# Patient Record
Sex: Female | Born: 1975
Health system: Southern US, Community
[De-identification: ages and names within clinical notes are randomized; demographics above are authoritative.]

## PROBLEM LIST (undated history)

## (undated) DIAGNOSIS — Z9289 Personal history of other medical treatment: Secondary | ICD-10-CM

## (undated) DIAGNOSIS — K219 Gastro-esophageal reflux disease without esophagitis: Secondary | ICD-10-CM

## (undated) DIAGNOSIS — R519 Headache, unspecified: Secondary | ICD-10-CM

## (undated) DIAGNOSIS — G971 Other reaction to spinal and lumbar puncture: Secondary | ICD-10-CM

## (undated) DIAGNOSIS — S83271A Complex tear of lateral meniscus, current injury, right knee, initial encounter: Secondary | ICD-10-CM

## (undated) DIAGNOSIS — J40 Bronchitis, not specified as acute or chronic: Secondary | ICD-10-CM

## (undated) DIAGNOSIS — J449 Chronic obstructive pulmonary disease, unspecified: Secondary | ICD-10-CM

## (undated) DIAGNOSIS — D696 Thrombocytopenia, unspecified: Secondary | ICD-10-CM

## (undated) DIAGNOSIS — D649 Anemia, unspecified: Secondary | ICD-10-CM

## (undated) DIAGNOSIS — M1711 Unilateral primary osteoarthritis, right knee: Secondary | ICD-10-CM

## (undated) HISTORY — DX: Chronic obstructive pulmonary disease, unspecified: J44.9

## (undated) HISTORY — DX: Gastro-esophageal reflux disease without esophagitis: K21.9

## (undated) HISTORY — DX: Personal history of other medical treatment: Z92.89

## (undated) HISTORY — PX: LAMINECTOMY LUMBAR SPLINE W/ PLACEMENT SPINAL CORD STIMULATOR: SHX1916

---

## 1995-06-21 HISTORY — PX: TUBAL LIGATION: SHX77

## 2004-11-30 ENCOUNTER — Emergency Department: Payer: Self-pay | Admitting: Emergency Medicine

## 2005-04-04 ENCOUNTER — Emergency Department: Payer: Self-pay | Admitting: Emergency Medicine

## 2005-07-05 ENCOUNTER — Emergency Department: Payer: Self-pay | Admitting: Internal Medicine

## 2005-10-13 ENCOUNTER — Other Ambulatory Visit: Payer: Self-pay

## 2005-10-13 ENCOUNTER — Emergency Department: Payer: Self-pay | Admitting: Emergency Medicine

## 2005-10-14 ENCOUNTER — Other Ambulatory Visit: Payer: Self-pay

## 2005-10-14 ENCOUNTER — Emergency Department: Payer: Self-pay | Admitting: Emergency Medicine

## 2008-06-17 ENCOUNTER — Emergency Department: Payer: Self-pay | Admitting: Internal Medicine

## 2009-06-06 ENCOUNTER — Emergency Department: Payer: Self-pay | Admitting: Emergency Medicine

## 2010-02-01 ENCOUNTER — Emergency Department: Payer: Self-pay | Admitting: Emergency Medicine

## 2010-05-30 ENCOUNTER — Emergency Department: Payer: Self-pay | Admitting: Unknown Physician Specialty

## 2010-11-06 ENCOUNTER — Emergency Department: Payer: Self-pay | Admitting: Emergency Medicine

## 2011-01-20 ENCOUNTER — Emergency Department: Payer: Self-pay | Admitting: Emergency Medicine

## 2011-08-18 ENCOUNTER — Emergency Department: Payer: Self-pay | Admitting: Emergency Medicine

## 2011-09-21 ENCOUNTER — Observation Stay: Payer: Self-pay | Admitting: Internal Medicine

## 2011-09-21 LAB — URINALYSIS, COMPLETE
Ketone: NEGATIVE
Leukocyte Esterase: NEGATIVE
Nitrite: NEGATIVE
Ph: 6 (ref 4.5–8.0)
Protein: NEGATIVE
Specific Gravity: 1.006 (ref 1.003–1.030)

## 2011-09-21 LAB — COMPREHENSIVE METABOLIC PANEL
Albumin: 3.8 g/dL (ref 3.4–5.0)
Bilirubin,Total: 0.2 mg/dL (ref 0.2–1.0)
Calcium, Total: 8.2 mg/dL — ABNORMAL LOW (ref 8.5–10.1)
Creatinine: 0.42 mg/dL — ABNORMAL LOW (ref 0.60–1.30)
Glucose: 78 mg/dL (ref 65–99)
Osmolality: 273 (ref 275–301)
Potassium: 3.9 mmol/L (ref 3.5–5.1)
SGOT(AST): 34 U/L (ref 15–37)
Sodium: 136 mmol/L (ref 136–145)

## 2011-09-21 LAB — IRON AND TIBC
Iron Bind.Cap.(Total): 541 ug/dL — ABNORMAL HIGH (ref 250–450)
Iron Saturation: 3 %
Iron: 16 ug/dL — ABNORMAL LOW (ref 50–170)
Unbound Iron-Bind.Cap.: 525 ug/dL

## 2011-09-21 LAB — CBC
HCT: 24.4 % — ABNORMAL LOW (ref 35.0–47.0)
HGB: 6.9 g/dL — ABNORMAL LOW (ref 12.0–16.0)
MCHC: 28.4 g/dL — ABNORMAL LOW (ref 32.0–36.0)
Platelet: 209 10*3/uL (ref 150–440)

## 2011-09-21 LAB — FERRITIN: Ferritin (ARMC): 2 ng/mL — ABNORMAL LOW (ref 8–388)

## 2011-09-21 LAB — TROPONIN I: Troponin-I: <0.02 ng/mL

## 2011-09-21 LAB — CK TOTAL AND CKMB (NOT AT ARMC): CK, Total: 62 U/L (ref 21–215)

## 2011-10-12 DIAGNOSIS — Z9289 Personal history of other medical treatment: Secondary | ICD-10-CM

## 2011-10-12 HISTORY — DX: Personal history of other medical treatment: Z92.89

## 2011-10-12 LAB — HM PAP SMEAR: HM PAP: NEGATIVE

## 2011-11-07 ENCOUNTER — Ambulatory Visit: Payer: Self-pay | Admitting: Obstetrics and Gynecology

## 2011-11-07 LAB — CBC
HCT: 39.9 % (ref 35.0–47.0)
MCH: 26.3 pg (ref 26.0–34.0)
MCV: 80 fL (ref 80–100)
Platelet: 153 10*3/uL (ref 150–440)
RDW: 32.3 % — ABNORMAL HIGH (ref 11.5–14.5)
WBC: 4.5 10*3/uL (ref 3.6–11.0)

## 2011-11-07 LAB — BASIC METABOLIC PANEL
Co2: 25 mmol/L (ref 21–32)
EGFR (African American): >60
Glucose: 94 mg/dL (ref 65–99)
Potassium: 3.3 mmol/L — ABNORMAL LOW (ref 3.5–5.1)
Sodium: 139 mmol/L (ref 136–145)

## 2011-11-07 LAB — PREGNANCY, URINE: Pregnancy Test, Urine: NEGATIVE m[IU]/mL

## 2011-11-11 ENCOUNTER — Ambulatory Visit: Payer: Self-pay | Admitting: Obstetrics and Gynecology

## 2011-11-11 HISTORY — PX: ENDOMETRIAL ABLATION: SHX621

## 2011-11-11 HISTORY — PX: DILATION AND CURETTAGE OF UTERUS: SHX78

## 2011-11-11 LAB — POTASSIUM: Potassium: 4.1 mmol/L (ref 3.5–5.1)

## 2011-11-17 LAB — PATHOLOGY REPORT

## 2012-01-25 ENCOUNTER — Emergency Department: Payer: Self-pay | Admitting: Emergency Medicine

## 2012-04-21 ENCOUNTER — Emergency Department: Payer: Self-pay | Admitting: Emergency Medicine

## 2012-06-16 ENCOUNTER — Emergency Department: Payer: Self-pay | Admitting: Emergency Medicine

## 2012-06-16 LAB — URINALYSIS, COMPLETE
Bacteria: NONE SEEN
Blood: NEGATIVE
Squamous Epithelial: 2
WBC UR: 1 /HPF (ref 0–5)

## 2012-06-16 LAB — CBC
HCT: 41.8 % (ref 35.0–47.0)
MCHC: 32 g/dL (ref 32.0–36.0)
RDW: 13.3 % (ref 11.5–14.5)
WBC: 3 10*3/uL — ABNORMAL LOW (ref 3.6–11.0)

## 2012-06-16 LAB — COMPREHENSIVE METABOLIC PANEL
Albumin: 3.4 g/dL (ref 3.4–5.0)
Anion Gap: 8 (ref 7–16)
Bilirubin,Total: 0.4 mg/dL (ref 0.2–1.0)
Calcium, Total: 8.5 mg/dL (ref 8.5–10.1)
Creatinine: 0.58 mg/dL — ABNORMAL LOW (ref 0.60–1.30)
Osmolality: 276 (ref 275–301)
Sodium: 139 mmol/L (ref 136–145)
Total Protein: 6.6 g/dL (ref 6.4–8.2)

## 2012-06-16 LAB — CK TOTAL AND CKMB (NOT AT ARMC): CK, Total: 31 U/L (ref 21–215)

## 2012-06-16 LAB — TROPONIN I: Troponin-I: <0.02 ng/mL

## 2012-07-16 ENCOUNTER — Emergency Department: Payer: Self-pay | Admitting: Emergency Medicine

## 2012-07-16 LAB — URINALYSIS, COMPLETE
Bilirubin,UR: NEGATIVE
Glucose,UR: NEGATIVE mg/dL (ref 0–75)
Ketone: NEGATIVE
Leukocyte Esterase: NEGATIVE
Nitrite: NEGATIVE
Ph: 7 (ref 4.5–8.0)
Specific Gravity: 1.023 (ref 1.003–1.030)
Squamous Epithelial: 2
WBC UR: 1 /HPF (ref 0–5)

## 2012-07-16 LAB — BASIC METABOLIC PANEL
BUN: 14 mg/dL (ref 7–18)
Chloride: 110 mmol/L — ABNORMAL HIGH (ref 98–107)
Creatinine: 0.42 mg/dL — ABNORMAL LOW (ref 0.60–1.30)
EGFR (Non-African Amer.): >60
Glucose: 76 mg/dL (ref 65–99)
Osmolality: 279 (ref 275–301)
Sodium: 140 mmol/L (ref 136–145)

## 2012-07-16 LAB — CBC
Platelet: 199 10*3/uL (ref 150–440)
RDW: 14.7 % — ABNORMAL HIGH (ref 11.5–14.5)
WBC: 3.3 10*3/uL — ABNORMAL LOW (ref 3.6–11.0)

## 2012-10-22 ENCOUNTER — Emergency Department: Payer: Self-pay | Admitting: Emergency Medicine

## 2012-11-19 ENCOUNTER — Emergency Department: Payer: Self-pay | Admitting: Emergency Medicine

## 2013-01-30 ENCOUNTER — Emergency Department (HOSPITAL_COMMUNITY)
Admission: EM | Admit: 2013-01-30 | Discharge: 2013-01-30 | Disposition: A | Discharge status: Home / Self Care | Payer: Self-pay | Attending: Emergency Medicine | Admitting: Emergency Medicine

## 2013-01-30 ENCOUNTER — Encounter (HOSPITAL_COMMUNITY): Payer: Self-pay | Admitting: *Deleted

## 2013-01-30 DIAGNOSIS — Z3202 Encounter for pregnancy test, result negative: Secondary | ICD-10-CM | POA: Insufficient documentation

## 2013-01-30 DIAGNOSIS — G43909 Migraine, unspecified, not intractable, without status migrainosus: Secondary | ICD-10-CM | POA: Insufficient documentation

## 2013-01-30 DIAGNOSIS — Z79899 Other long term (current) drug therapy: Secondary | ICD-10-CM | POA: Insufficient documentation

## 2013-01-30 DIAGNOSIS — F17200 Nicotine dependence, unspecified, uncomplicated: Secondary | ICD-10-CM | POA: Insufficient documentation

## 2013-01-30 LAB — CBC WITH DIFFERENTIAL/PLATELET
Basophils Absolute: 0 10*3/uL (ref 0.0–0.1)
Basophils Relative: 0 % (ref 0–1)
Hemoglobin: 13.6 g/dL (ref 12.0–15.0)
MCHC: 34.7 g/dL (ref 30.0–36.0)
Monocytes Relative: 6 % (ref 3–12)
Neutro Abs: 2.4 10*3/uL (ref 1.7–7.7)
Neutrophils Relative %: 55 % (ref 43–77)
RBC: 4.49 MIL/uL (ref 3.87–5.11)

## 2013-01-30 MED ORDER — ONDANSETRON HCL 4 MG/2ML IJ SOLN: 4.0000 mg | Freq: Once | INTRAMUSCULAR | Status: DC

## 2013-01-30 MED ORDER — METOCLOPRAMIDE HCL 5 MG/ML IJ SOLN
10.0000 mg | Freq: Once | INTRAMUSCULAR | Status: AC
  Administered 2013-01-30: 10 mg via INTRAVENOUS
  Filled 2013-01-30: qty 2

## 2013-01-30 MED ORDER — DIPHENHYDRAMINE HCL 50 MG/ML IJ SOLN
25.0000 mg | Freq: Once | INTRAMUSCULAR | Status: AC
  Administered 2013-01-30: 25 mg via INTRAVENOUS
  Filled 2013-01-30: qty 1

## 2013-01-30 MED ORDER — DEXAMETHASONE SODIUM PHOSPHATE 10 MG/ML IJ SOLN
10.0000 mg | Freq: Once | INTRAMUSCULAR | Status: AC
  Administered 2013-01-30: 10 mg via INTRAVENOUS
  Filled 2013-01-30: qty 1

## 2013-01-30 MED ORDER — SODIUM CHLORIDE 0.9 % IV BOLUS (SEPSIS)
1000.0000 mL | Freq: Once | INTRAVENOUS | Status: AC
  Administered 2013-01-30: 1000 mL via INTRAVENOUS

## 2013-01-30 NOTE — ED Provider Notes (Signed)
CSN: [id number]     Arrival date & time 01/30/13  1628 History     First MD Initiated Contact with Patient 01/30/13 1908     Chief Complaint  Patient presents with  . Headache   (Consider location/radiation/quality/duration/timing/severity/associated sxs/prior Treatment) Patient is a 37 y.o. female presenting with headaches. The history is provided by the patient.  Headache Pain location:  Frontal Radiates to:  Does not radiate Severity currently:  9/10 Severity at highest:  10/10 Onset quality:  Gradual Duration:  1 week Timing:  Constant Progression:  Unchanged Chronicity:  New Similar to prior headaches: yes   Relieved by:  Nothing Worsened by:  Nothing tried Ineffective treatments:  Acetaminophen Associated symptoms: nausea and photophobia   Associated symptoms: no abdominal pain, no back pain, no blurred vision, no cough, no diarrhea, no fatigue, no fever, no focal weakness, no neck pain, no neck stiffness, no numbness, no paresthesias, no syncope, no tingling, no visual change, no vomiting and no weakness   Nausea:    Severity:  Mild   Onset quality:  Gradual   Duration:  1 week   Timing:  Intermittent   Progression:  Unchanged   History reviewed. No pertinent past medical history. History reviewed. No pertinent past surgical history. No family history on file. History  Substance Use Topics  . Smoking status: Current Every Day Smoker  . Smokeless tobacco: Not on file  . Alcohol Use: No   OB History   Grav Para Term Preterm Abortions TAB SAB Ect Mult Living                 Review of Systems  Constitutional: Negative for fever, chills, diaphoresis, appetite change and fatigue.  HENT: Negative for neck pain and neck stiffness.   Eyes: Positive for photophobia. Negative for blurred vision and visual disturbance.  Respiratory: Negative for cough, chest tightness and shortness of breath.   Cardiovascular: Negative for chest pain, palpitations and syncope.   Gastrointestinal: Positive for nausea. Negative for vomiting, abdominal pain, diarrhea and abdominal distention.  Genitourinary: Negative for dysuria.  Musculoskeletal: Negative for back pain and gait problem.  Neurological: Positive for light-headedness and headaches. Negative for focal weakness, numbness and paresthesias.  All other systems reviewed and are negative.    Allergies  Review of patient's allergies indicates no known allergies.  Home Medications   Current Outpatient Rx  Name  Route  Sig  Dispense  Refill  . Aspirin-Salicylamide-Caffeine (BC HEADACHE POWDER PO)   Oral   Take 1 each by mouth daily as needed (headache).         . Cholecalciferol (VITAMIN D PO)   Oral   Take 1 tablet by mouth daily.         . Cyanocobalamin (VITAMIN B-12 PO)   Oral   Take 1 tablet by mouth daily.         . IRON PO   Oral   Take 1 tablet by mouth daily.          BP 108/66  Pulse 53  Temp(Src) 98.9 F (37.2 C) (Oral)  Resp 16  SpO2 100% Physical Exam  Nursing note and vitals reviewed. Constitutional: She is oriented to person, place, and time. She appears well-developed and well-nourished. No distress.  HENT:  Head: Normocephalic and atraumatic.  Eyes: EOM are normal. Pupils are equal, round, and reactive to light.  Neck: Normal range of motion. Neck supple.  Cardiovascular: Normal rate, normal heart sounds and intact distal pulses.  Pulmonary/Chest: Effort normal and breath sounds normal. No respiratory distress. She has no wheezes. She has no rales.  Abdominal: Soft. Bowel sounds are normal. She exhibits no distension. There is no tenderness. There is no rebound and no guarding.  Musculoskeletal: Normal range of motion. She exhibits no edema and no tenderness.  Neurological: She is alert and oriented to person, place, and time. She has normal strength. No cranial nerve deficit or sensory deficit. She exhibits normal muscle tone. Coordination and gait normal. GCS  eye subscore is 4. GCS verbal subscore is 5. GCS motor subscore is 6.  Skin: Skin is warm and dry. No rash noted. She is not diaphoretic.    ED Course   Procedures (including critical care time)  Labs Reviewed  CBC WITH DIFFERENTIAL  PREGNANCY, URINE   No results found. 1. Migraine     MDM  37 y.o. F with a PMH of migraines and anemia presenting with HA.  Pt reports HA onset was 1 week ago.  HA gradual in onset, frontal, and typical of her prior migraines. +photophobia. She has been taking BC headache powder with no relief.  Today pt states she became lightheaded and nauseous.  She has not had much po intake this week.  Denies vertigo, vision change, or vomiting.  Denies neck pain or stiffness, and no numbness or focal weakness.  On exam, pt AFVSS, pt well-appearing, in NAD.  No focal neurologic deficits on exam.  Exam otherwise WNL.  Symptoms most consistent with migraine given pt's history and current symptoms similar to prior headaches.  HA gradual in onset and no neck pain or stiffness to suggest SAH.  Will check CBC given h/o anemia and complaint of lightheadedness.  UPT ordered.  Migraine cocktail adn fluids for symptomatic treatment.  CBC with normal hemoglobin. UPT negative. Pt re-evaluated and reports symptomatic resolution. Stable for discharge.  ED return precautions given.  No further questions or concerns, stable at discharge.  Discussed with attending Dr. Bebe Shaggy.    Jodean Lima, MD 01/31/13 [phone]

## 2013-01-30 NOTE — ED Notes (Signed)
Spoke with patient about need for urine sample. Patient agreed to provide, but cannot at this time due to symptoms. ED Techs aware and will allow patient 15 mins prior to offering straight cath.

## 2013-01-30 NOTE — ED Notes (Signed)
Headache fopr one with with dizziness and feeling lightheaded.  Nausea no vomiting

## 2013-02-01 NOTE — ED Provider Notes (Signed)
I have personally seen and examined the patient.  I have discussed the plan of care with the resident.  I have reviewed the documentation on PMH/FH/Soc. History.  I have reviewed the documentation of the resident and agree.  Pt ambulatory, no neuro deficits stable for d/c home I doubt acute neurologic process at this time  Joya Gaskins, MD 02/01/13 [phone]

## 2013-09-13 ENCOUNTER — Emergency Department: Payer: Self-pay | Admitting: Internal Medicine

## 2013-09-16 LAB — BETA STREP CULTURE(ARMC)

## 2013-11-02 ENCOUNTER — Emergency Department: Payer: Self-pay | Admitting: Emergency Medicine

## 2014-01-07 ENCOUNTER — Emergency Department: Payer: Self-pay | Admitting: Emergency Medicine

## 2014-03-18 ENCOUNTER — Emergency Department: Payer: Self-pay | Admitting: Internal Medicine

## 2014-03-18 LAB — URINALYSIS, COMPLETE
BILIRUBIN, UR: NEGATIVE
GLUCOSE, UR: NEGATIVE mg/dL (ref 0–75)
KETONE: NEGATIVE
Nitrite: POSITIVE
PH: 7 (ref 4.5–8.0)
Specific Gravity: 1.011 (ref 1.003–1.030)
Squamous Epithelial: 2
WBC UR: 99 /HPF (ref 0–5)

## 2014-03-18 LAB — PREGNANCY, URINE: PREGNANCY TEST, URINE: NEGATIVE m[IU]/mL

## 2014-03-18 LAB — COMPREHENSIVE METABOLIC PANEL
ALBUMIN: 3.4 g/dL (ref 3.4–5.0)
ALT: 26 U/L
ANION GAP: 7 (ref 7–16)
Alkaline Phosphatase: 62 U/L
BILIRUBIN TOTAL: 0.3 mg/dL (ref 0.2–1.0)
BUN: 14 mg/dL (ref 7–18)
CALCIUM: 8.7 mg/dL (ref 8.5–10.1)
CHLORIDE: 106 mmol/L (ref 98–107)
CO2: 26 mmol/L (ref 21–32)
Creatinine: 0.76 mg/dL (ref 0.60–1.30)
GLUCOSE: 78 mg/dL (ref 65–99)
Osmolality: 277 (ref 275–301)
POTASSIUM: 4 mmol/L (ref 3.5–5.1)
SGOT(AST): 19 U/L (ref 15–37)
SODIUM: 139 mmol/L (ref 136–145)
Total Protein: 6.7 g/dL (ref 6.4–8.2)

## 2014-03-20 LAB — URINE CULTURE

## 2014-04-04 ENCOUNTER — Emergency Department: Payer: Self-pay | Admitting: Emergency Medicine

## 2014-04-04 LAB — CBC
HCT: 40 % (ref 35.0–47.0)
HGB: 13 g/dL (ref 12.0–16.0)
MCH: 29.1 pg (ref 26.0–34.0)
MCHC: 32.4 g/dL (ref 32.0–36.0)
MCV: 90 fL (ref 80–100)
Platelet: 213 10*3/uL (ref 150–440)
RBC: 4.45 10*6/uL (ref 3.80–5.20)
RDW: 13.5 % (ref 11.5–14.5)
WBC: 5.4 10*3/uL (ref 3.6–11.0)

## 2014-04-04 LAB — BASIC METABOLIC PANEL
ANION GAP: 10 (ref 7–16)
BUN: 7 mg/dL (ref 7–18)
CO2: 26 mmol/L (ref 21–32)
Calcium, Total: 8.4 mg/dL — ABNORMAL LOW (ref 8.5–10.1)
Chloride: 107 mmol/L (ref 98–107)
Creatinine: 0.76 mg/dL (ref 0.60–1.30)
EGFR (Non-African Amer.): >60
Glucose: 107 mg/dL — ABNORMAL HIGH (ref 65–99)
Osmolality: 283 (ref 275–301)
Potassium: 3.2 mmol/L — ABNORMAL LOW (ref 3.5–5.1)
SODIUM: 143 mmol/L (ref 136–145)

## 2014-04-04 LAB — TROPONIN I: Troponin-I: <0.02 ng/mL

## 2014-04-19 ENCOUNTER — Emergency Department: Payer: Self-pay | Admitting: Student

## 2014-06-01 ENCOUNTER — Emergency Department (HOSPITAL_COMMUNITY): Payer: Self-pay

## 2014-06-01 ENCOUNTER — Emergency Department (HOSPITAL_COMMUNITY)
Admission: EM | Admit: 2014-06-01 | Discharge: 2014-06-01 | Disposition: A | Discharge status: Home / Self Care | Payer: Self-pay | Attending: Emergency Medicine | Admitting: Emergency Medicine

## 2014-06-01 ENCOUNTER — Encounter (HOSPITAL_COMMUNITY): Payer: Self-pay | Admitting: *Deleted

## 2014-06-01 DIAGNOSIS — Z79899 Other long term (current) drug therapy: Secondary | ICD-10-CM | POA: Insufficient documentation

## 2014-06-01 DIAGNOSIS — R059 Cough, unspecified: Secondary | ICD-10-CM

## 2014-06-01 DIAGNOSIS — D649 Anemia, unspecified: Secondary | ICD-10-CM | POA: Insufficient documentation

## 2014-06-01 DIAGNOSIS — J40 Bronchitis, not specified as acute or chronic: Secondary | ICD-10-CM | POA: Insufficient documentation

## 2014-06-01 DIAGNOSIS — R05 Cough: Secondary | ICD-10-CM

## 2014-06-01 DIAGNOSIS — Z720 Tobacco use: Secondary | ICD-10-CM | POA: Insufficient documentation

## 2014-06-01 HISTORY — DX: Anemia, unspecified: D64.9

## 2014-06-01 MED ORDER — BENZONATATE 100 MG PO CAPS
100.0000 mg | ORAL_CAPSULE | Freq: Three times a day (TID) | ORAL | Status: DC
Start: 2014-06-01 — End: 2014-10-28

## 2014-06-01 MED ORDER — ALBUTEROL SULFATE HFA 108 (90 BASE) MCG/ACT IN AERS
2.0000 | INHALATION_SPRAY | RESPIRATORY_TRACT | Status: DC | PRN
Start: 2014-06-01 — End: 2014-06-01
  Administered 2014-06-01: 2 via RESPIRATORY_TRACT
  Filled 2014-06-01: qty 6.7

## 2014-06-01 MED ORDER — PREDNISONE 20 MG PO TABS: 40.0000 mg | ORAL_TABLET | Freq: Every day | ORAL | Status: DC

## 2014-06-01 NOTE — ED Provider Notes (Signed)
CSN: [id number]     Arrival date & time 06/01/14  5573 History   First MD Initiated Contact with Patient 06/01/14 1140     Chief Complaint  Patient presents with  . Cough     (Consider location/radiation/quality/duration/timing/severity/associated sxs/prior Treatment) HPI Comments: Pt states that for the last week she has had a productive cough and wheezing. Denies fever. States that she has had cough till the point of vomiting. She has tried otc medications without relief. Pt is a smoker. History of bronchitis.  The history is provided by the patient. No language interpreter was used.    Past Medical History  Diagnosis Date  . Anemia    Past Surgical History  Procedure Laterality Date  . Cesarean section    . Tubal ligation    . Dilation and curettage of uterus     No family history on file. History  Substance Use Topics  . Smoking status: Current Every Day Smoker    Types: Cigarettes  . Smokeless tobacco: Never Used  . Alcohol Use: No   OB History    No data available     Review of Systems  All other systems reviewed and are negative.     Allergies  Review of patient's allergies indicates no known allergies.  Home Medications   Prior to Admission medications   Medication Sig Start Date End Date Taking? Authorizing Provider  ferrous fumarate (HEMOCYTE - 106 MG FE) 325 (106 FE) MG TABS tablet Take 1 tablet by mouth daily.   Yes Historical Provider, MD  benzonatate (TESSALON) 100 MG capsule Take 1 capsule (100 mg total) by mouth every 8 (eight) hours. 06/01/14   Glendell Docker, NP  predniSONE (DELTASONE) 20 MG tablet Take 2 tablets (40 mg total) by mouth daily with breakfast. 06/01/14   Glendell Docker, NP   BP 114/76 mmHg  Pulse 66  Temp(Src) 97.8 F (36.6 C) (Oral)  Resp 22  SpO2 100%  LMP 05/20/2014 Physical Exam  Constitutional: She is oriented to person, place, and time. She appears well-developed and well-nourished.  HENT:  Right Ear: External  ear normal.  Left Ear: External ear normal.  Nose: Rhinorrhea present.  Mouth/Throat: Posterior oropharyngeal erythema present.  Cardiovascular: Normal rate and regular rhythm.   Pulmonary/Chest: Effort normal. She has wheezes.  Musculoskeletal: Normal range of motion.  Neurological: She is oriented to person, place, and time.  Skin: Skin is warm and dry.  Nursing note and vitals reviewed.   ED Course  Procedures (including critical care time) Labs Review Labs Reviewed - No data to display  Imaging Review Dg Chest 2 View  06/01/2014   CLINICAL DATA:  38 year old female with 3 month history of productive cough  EXAM: CHEST  2 VIEW  COMPARISON:  Prior chest x-ray 04/04/2014  FINDINGS: The lungs are clear and negative for focal airspace consolidation, pulmonary edema or suspicious pulmonary nodule. No pleural effusion or pneumothorax. Cardiac and mediastinal contours are within normal limits. No acute fracture or lytic or blastic osseous lesions. The visualized upper abdominal bowel gas pattern is unremarkable.  IMPRESSION: Negative chest x-ray.   Electronically Signed   By: Jacqulynn Cadet M.D.   On: 06/01/2014 11:41     EKG Interpretation None      MDM   Final diagnoses:  Bronchitis    Pt sent home with albuterol, prednisone and tessalon. No infection noted on x-ray.pt vitals are good. Discussed return precautions   Glendell Docker, NP 06/01/14 [place] Alvino [place],  MD 06/01/14 1506

## 2014-06-01 NOTE — ED Notes (Signed)
Pt given inhaler and demonstrated proper use without additional questions.

## 2014-06-01 NOTE — ED Notes (Signed)
Patient states she has had a cough productive of yellow sputum since the end of September.  Patient was seen at [facility] and dx with bronchitis.  Patient was placed on abx and completed that course.  She has been taking OTC cold meds with no relief.  Patient states the cough is so strong she sometimes vomits.  Patient also c/o throat and head pain r/t cough.  Patient denies fever and inherent nausea.  Patient denies pain elsewhere.  Patient is coughing on exam.

## 2014-06-01 NOTE — Discharge Instructions (Signed)
Upper Respiratory Infection, Adult An upper respiratory infection (URI) is also sometimes known as the common cold. The upper respiratory tract includes the nose, sinuses, throat, trachea, and bronchi. Bronchi are the airways leading to the lungs. Most people improve within 1 week, but symptoms can last up to 2 weeks. A residual cough may last even longer.  CAUSES Many different viruses can infect the tissues lining the upper respiratory tract. The tissues become irritated and inflamed and often become very moist. Mucus production is also common. A cold is contagious. You can easily spread the virus to others by oral contact. This includes kissing, sharing a glass, coughing, or sneezing. Touching your mouth or nose and then touching a surface, which is then touched by another person, can also spread the virus. SYMPTOMS  Symptoms typically develop 1 to 3 days after you come in contact with a cold virus. Symptoms vary from person to person. They may include:  Runny nose.  Sneezing.  Nasal congestion.  Sinus irritation.  Sore throat.  Loss of voice (laryngitis).  Cough.  Fatigue.  Muscle aches.  Loss of appetite.  Headache.  Low-grade fever. DIAGNOSIS  You might diagnose your own cold based on familiar symptoms, since most people get a cold 2 to 3 times a year. Your caregiver can confirm this based on your exam. Most importantly, your caregiver can check that your symptoms are not due to another disease such as strep throat, sinusitis, pneumonia, asthma, or epiglottitis. Blood tests, throat tests, and X-rays are not necessary to diagnose a common cold, but they may sometimes be helpful in excluding other more serious diseases. Your caregiver will decide if any further tests are required. RISKS AND COMPLICATIONS  You may be at risk for a more severe case of the common cold if you smoke cigarettes, have chronic heart disease (such as heart failure) or lung disease (such as asthma), or if  you have a weakened immune system. The very young and very old are also at risk for more serious infections. Bacterial sinusitis, middle ear infections, and bacterial pneumonia can complicate the common cold. The common cold can worsen asthma and chronic obstructive pulmonary disease (COPD). Sometimes, these complications can require emergency medical care and may be life-threatening. PREVENTION  The best way to protect against getting a cold is to practice good hygiene. Avoid oral or hand contact with people with cold symptoms. Wash your hands often if contact occurs. There is no clear evidence that vitamin C, vitamin E, echinacea, or exercise reduces the chance of developing a cold. However, it is always recommended to get plenty of rest and practice good nutrition. TREATMENT  Treatment is directed at relieving symptoms. There is no cure. Antibiotics are not effective, because the infection is caused by a virus, not by bacteria. Treatment may include:  Increased fluid intake. Sports drinks offer valuable electrolytes, sugars, and fluids.  Breathing heated mist or steam (vaporizer or shower).  Eating chicken soup or other clear broths, and maintaining good nutrition.  Getting plenty of rest.  Using gargles or lozenges for comfort.  Controlling fevers with ibuprofen or acetaminophen as directed by your caregiver.  Increasing usage of your inhaler if you have asthma. Zinc gel and zinc lozenges, taken in the first 24 hours of the common cold, can shorten the duration and lessen the severity of symptoms. Pain medicines may help with fever, muscle aches, and throat pain. A variety of non-prescription medicines are available to treat congestion and runny nose. Your caregiver   can make recommendations and may suggest nasal or lung inhalers for other symptoms.  HOME CARE INSTRUCTIONS   Only take over-the-counter or prescription medicines for pain, discomfort, or fever as directed by your  caregiver.  Use a warm mist humidifier or inhale steam from a shower to increase air moisture. This may keep secretions moist and make it easier to breathe.  Drink enough water and fluids to keep your urine clear or pale yellow.  Rest as needed.  Return to work when your temperature has returned to normal or as your caregiver advises. You may need to stay home longer to avoid infecting others. You can also use a face mask and careful hand washing to prevent spread of the virus. SEEK MEDICAL CARE IF:   After the first few days, you feel you are getting worse rather than better.  You need your caregiver's advice about medicines to control symptoms.  You develop chills, worsening shortness of breath, or brown or red sputum. These may be signs of pneumonia.  You develop yellow or brown nasal discharge or pain in the face, especially when you bend forward. These may be signs of sinusitis.  You develop a fever, swollen neck glands, pain with swallowing, or white areas in the back of your throat. These may be signs of strep throat. SEEK IMMEDIATE MEDICAL CARE IF:   You have a fever.  You develop severe or persistent headache, ear pain, sinus pain, or chest pain.  You develop wheezing, a prolonged cough, cough up blood, or have a change in your usual mucus (if you have chronic lung disease).  You develop sore muscles or a stiff neck. Document Released: 11/30/2000 Document Revised: 08/29/2011 Document Reviewed: 09/11/2013 ExitCare Patient Information 2015 [employer]. This information is not intended to replace advice given to you by your health care provider. Make sure you discuss any questions you have with your health care provider.  

## 2014-07-02 ENCOUNTER — Emergency Department (HOSPITAL_COMMUNITY): Payer: Self-pay

## 2014-07-02 ENCOUNTER — Encounter (HOSPITAL_COMMUNITY): Payer: Self-pay | Admitting: Emergency Medicine

## 2014-07-02 ENCOUNTER — Emergency Department (HOSPITAL_COMMUNITY)
Admission: EM | Admit: 2014-07-02 | Discharge: 2014-07-02 | Disposition: A | Discharge status: Home / Self Care | Payer: Self-pay | Attending: Emergency Medicine | Admitting: Emergency Medicine

## 2014-07-02 DIAGNOSIS — Z87891 Personal history of nicotine dependence: Secondary | ICD-10-CM | POA: Insufficient documentation

## 2014-07-02 DIAGNOSIS — J069 Acute upper respiratory infection, unspecified: Secondary | ICD-10-CM | POA: Insufficient documentation

## 2014-07-02 DIAGNOSIS — R05 Cough: Secondary | ICD-10-CM

## 2014-07-02 DIAGNOSIS — D649 Anemia, unspecified: Secondary | ICD-10-CM | POA: Insufficient documentation

## 2014-07-02 DIAGNOSIS — R059 Cough, unspecified: Secondary | ICD-10-CM

## 2014-07-02 DIAGNOSIS — Z7952 Long term (current) use of systemic steroids: Secondary | ICD-10-CM | POA: Insufficient documentation

## 2014-07-02 DIAGNOSIS — Z79899 Other long term (current) drug therapy: Secondary | ICD-10-CM | POA: Insufficient documentation

## 2014-07-02 MED ORDER — GUAIFENESIN 100 MG/5ML PO SYRP: 100.0000 mg | ORAL_SOLUTION | Freq: Once | ORAL | Status: DC

## 2014-07-02 MED ORDER — GUAIFENESIN 100 MG/5ML PO SOLN
5.0000 mL | Freq: Once | ORAL | Status: AC
  Administered 2014-07-02: 100 mg via ORAL
  Filled 2014-07-02: qty 5

## 2014-07-02 MED ORDER — GUAIFENESIN-CODEINE 100-10 MG/5ML PO SOLN: 5.0000 mL | Freq: Three times a day (TID) | ORAL | Status: DC | PRN

## 2014-07-02 NOTE — Discharge Instructions (Signed)
Upper Respiratory Infection, Adult An upper respiratory infection (URI) is also sometimes known as the common cold. The upper respiratory tract includes the nose, sinuses, throat, trachea, and bronchi. Bronchi are the airways leading to the lungs. Most people improve within 1 week, but symptoms can last up to 2 weeks. A residual cough may last even longer.  CAUSES Many different viruses can infect the tissues lining the upper respiratory tract. The tissues become irritated and inflamed and often become very moist. Mucus production is also common. A cold is contagious. You can easily spread the virus to others by oral contact. This includes kissing, sharing a glass, coughing, or sneezing. Touching your mouth or nose and then touching a surface, which is then touched by another person, can also spread the virus. SYMPTOMS  Symptoms typically develop 1 to 3 days after you come in contact with a cold virus. Symptoms vary from person to person. They may include:  Runny nose.  Sneezing.  Nasal congestion.  Sinus irritation.  Sore throat.  Loss of voice (laryngitis).  Cough.  Fatigue.  Muscle aches.  Loss of appetite.  Headache.  Low-grade fever. DIAGNOSIS  You might diagnose your own cold based on familiar symptoms, since most people get a cold 2 to 3 times a year. Your caregiver can confirm this based on your exam. Most importantly, your caregiver can check that your symptoms are not due to another disease such as strep throat, sinusitis, pneumonia, asthma, or epiglottitis. Blood tests, throat tests, and X-rays are not necessary to diagnose a common cold, but they may sometimes be helpful in excluding other more serious diseases. Your caregiver will decide if any further tests are required. RISKS AND COMPLICATIONS  You may be at risk for a more severe case of the common cold if you smoke cigarettes, have chronic heart disease (such as heart failure) or lung disease (such as asthma), or if  you have a weakened immune system. The very young and very old are also at risk for more serious infections. Bacterial sinusitis, middle ear infections, and bacterial pneumonia can complicate the common cold. The common cold can worsen asthma and chronic obstructive pulmonary disease (COPD). Sometimes, these complications can require emergency medical care and may be life-threatening. PREVENTION  The best way to protect against getting a cold is to practice good hygiene. Avoid oral or hand contact with people with cold symptoms. Wash your hands often if contact occurs. There is no clear evidence that vitamin C, vitamin E, echinacea, or exercise reduces the chance of developing a cold. However, it is always recommended to get plenty of rest and practice good nutrition. TREATMENT  Treatment is directed at relieving symptoms. There is no cure. Antibiotics are not effective, because the infection is caused by a virus, not by bacteria. Treatment may include:  Increased fluid intake. Sports drinks offer valuable electrolytes, sugars, and fluids.  Breathing heated mist or steam (vaporizer or shower).  Eating chicken soup or other clear broths, and maintaining good nutrition.  Getting plenty of rest.  Using gargles or lozenges for comfort.  Controlling fevers with ibuprofen or acetaminophen as directed by your caregiver.  Increasing usage of your inhaler if you have asthma. Zinc gel and zinc lozenges, taken in the first 24 hours of the common cold, can shorten the duration and lessen the severity of symptoms. Pain medicines may help with fever, muscle aches, and throat pain. A variety of non-prescription medicines are available to treat congestion and runny nose. Your caregiver   can make recommendations and may suggest nasal or lung inhalers for other symptoms.  HOME CARE INSTRUCTIONS   Only take over-the-counter or prescription medicines for pain, discomfort, or fever as directed by your  caregiver.  Use a warm mist humidifier or inhale steam from a shower to increase air moisture. This may keep secretions moist and make it easier to breathe.  Drink enough water and fluids to keep your urine clear or pale yellow.  Rest as needed.  Return to work when your temperature has returned to normal or as your caregiver advises. You may need to stay home longer to avoid infecting others. You can also use a face mask and careful hand washing to prevent spread of the virus. SEEK MEDICAL CARE IF:   After the first few days, you feel you are getting worse rather than better.  You need your caregiver's advice about medicines to control symptoms.  You develop chills, worsening shortness of breath, or brown or red sputum. These may be signs of pneumonia.  You develop yellow or brown nasal discharge or pain in the face, especially when you bend forward. These may be signs of sinusitis.  You develop a fever, swollen neck glands, pain with swallowing, or white areas in the back of your throat. These may be signs of strep throat. SEEK IMMEDIATE MEDICAL CARE IF:   You have a fever.  You develop severe or persistent headache, ear pain, sinus pain, or chest pain.  You develop wheezing, a prolonged cough, cough up blood, or have a change in your usual mucus (if you have chronic lung disease).  You develop sore muscles or a stiff neck. Document Released: 11/30/2000 Document Revised: 08/29/2011 Document Reviewed: 09/11/2013 ExitCare Patient Information 2015 [employer]. This information is not intended to replace advice given to you by your health care provider. Make sure you discuss any questions you have with your health care provider.  

## 2014-07-02 NOTE — ED Provider Notes (Signed)
CSN: [id number]     Arrival date & time 07/02/14  7124 History   First MD Initiated Contact with Patient 07/02/14 [phone]     Chief Complaint  Patient presents with  . Cough  . Emesis     (Consider location/radiation/quality/duration/timing/severity/associated sxs/prior Treatment) HPI   39 year old female with history of anemia presents with cough and SOB.  Pt report for the past 4 months she has had intermittent upper respiratory complaints including congestion, and cough which she has been seen in the ER twice previously.  CXR performed showing no evidence of PNA.  She however has been prescribed abx, cough medication and steroid as treatment.  Report intermittent relief with medication.  For the past 5 days her sxs return including congestion, productive cough with yellow phlegm, sob, and posttussive emesis.  No fever, chills.  Report having difficulty tolerating food but currently with out nausea.    Past Medical History  Diagnosis Date  . Anemia    Past Surgical History  Procedure Laterality Date  . Cesarean section    . Tubal ligation    . Dilation and curettage of uterus     No family history on file. History  Substance Use Topics  . Smoking status: Former Smoker    Types: Cigarettes    Quit date: 06/11/2014  . Smokeless tobacco: Never Used  . Alcohol Use: No   OB History    No data available     Review of Systems  All other systems reviewed and are negative.     Allergies  Review of patient's allergies indicates no known allergies.  Home Medications   Prior to Admission medications   Medication Sig Start Date End Date Taking? Authorizing Provider  benzonatate (TESSALON) 100 MG capsule Take 1 capsule (100 mg total) by mouth every 8 (eight) hours. 06/01/14   Glendell Docker, NP  ferrous fumarate (HEMOCYTE - 106 MG FE) 325 (106 FE) MG TABS tablet Take 1 tablet by mouth daily.    Historical Provider, MD  predniSONE (DELTASONE) 20 MG tablet Take 2 tablets (40 mg  total) by mouth daily with breakfast. 06/01/14   Glendell Docker, NP   BP 122/72 mmHg  Pulse 64  Temp(Src) 97.5 F (36.4 C) (Oral)  Resp 18  Ht 5\' 7"  (1.702 m)  Wt 245 lb (111.131 kg)  BMI 38.36 kg/m2  SpO2 100%  LMP 06/20/2014 Physical Exam  Constitutional: She is oriented to person, place, and time. She appears well-developed and well-nourished. No distress.  HENT:  Head: Atraumatic.  Right Ear: External ear normal.  Left Ear: External ear normal.  Mouth/Throat: Oropharynx is clear and moist. No oropharyngeal exudate.  Eyes: Conjunctivae are normal.  Neck: Normal range of motion. Neck supple.  No nuchal rigidity  Cardiovascular: Normal rate and regular rhythm.   Pulmonary/Chest: Effort normal and breath sounds normal.  Abdominal: Soft. There is no tenderness.  Neurological: She is alert and oriented to person, place, and time.  Skin: No rash noted.  Psychiatric: She has a normal mood and affect.  Nursing note and vitals reviewed.   ED Course  Procedures (including critical care time)  7:30 AM Recurrent URI sxs, not improved with steroid/cough medication and abx.    8:29 AM Chest x-ray without acute infiltrate. Patient ambulate without hypoxia. She is afebrile, vital signs stable. Suspect viral syndrome. Will prescribe cough medication. Symptomatically treatment. Patient agrees to follow-up with PCP for further care. Return precautions discussed.  Labs Review Labs Reviewed - No data to  display  Imaging Review Dg Chest 2 View  07/02/2014   CLINICAL DATA:  Productive cough for 4 days. Vomiting today. Initial encounter.  EXAM: CHEST  2 VIEW  COMPARISON:  PA and lateral chest 06/01/2014 and 09/21/2011.  FINDINGS: Heart size and mediastinal contours are within normal limits. Both lungs are clear. Visualized skeletal structures are unremarkable.  IMPRESSION: Negative exam.   Electronically Signed   By: Inge Rise M.D.   On: 07/02/2014 07:47     EKG  Interpretation None      MDM   Final diagnoses:  Cough  URI (upper respiratory infection)    BP 122/72 mmHg  Pulse 64  Temp(Src) 97.5 F (36.4 C) (Oral)  Resp 18  Ht 5\' 7"  (1.702 m)  Wt 245 lb (111.131 kg)  BMI 38.36 kg/m2  SpO2 100%  LMP 06/20/2014  I have reviewed nursing notes and vital signs. I personally reviewed the imaging tests through PACS system  I reviewed available ER/hospitalization records thought the EMR     Domenic Moras, PA-C 07/02/14 [place], DO 07/05/14 [id number]

## 2014-07-02 NOTE — ED Notes (Signed)
Pt c/o cough x 4 days, emesis today. Pt tx for bronchitis recently with cough meds, steroids and MDI. Pt states now worse.

## 2014-09-04 ENCOUNTER — Emergency Department (HOSPITAL_COMMUNITY): Admission: EM | Admit: 2014-09-04 | Discharge: 2014-09-04 | Discharge status: Against Medical Advice | Payer: Self-pay

## 2014-09-04 NOTE — ED Notes (Signed)
No answer from triage

## 2014-09-04 NOTE — ED Notes (Signed)
Pt not answering from triage

## 2014-09-09 ENCOUNTER — Emergency Department: Payer: Self-pay | Admitting: Internal Medicine

## 2014-10-12 NOTE — Discharge Summary (Signed)
PATIENT NAME:  Kara West, Kara West MR#:  [id number] DATE OF BIRTH:  1976/05/12  DATE OF ADMISSION:  09/21/2011 DATE OF DISCHARGE:  09/21/2011  ADMITTING DIAGNOSES:  1. Substernal burning sensation. 2. Epigastric pain. 3. Dyspnea on exertion.   DISCHARGE DIAGNOSES:  1. Substernal burning likely due to reflux symptoms. The patient will be placed on PPIs.  2. Symptomatic anemia likely due to menorrhagia. The patient is status post transfusion 2 units of packed RBCs. Will need outpatient GYN follow-up. The patient currently is not menstruating.  3. Severe iron deficiency anemia due to menorrhagia. The patient will be discharged on iron supplements.   CONSULTANTS: None.   PERTINENT LABS AND EVALUATIONS: CBC on presentation 3.7, hemoglobin 6.9, platelet count 209, MCV 61. Troponin less than 0.02. CK-MB less than 0.5. EKG showed normal sinus rhythm without any ST-T wave changes. Chest x-ray was no acute cardiopulmonary processes. Iron level 16, iron saturation 3, ferritin 2.  HOSPITAL COURSE: Please see history and physical for details. In brief, the patient is a 39 year old African American female who presented with complaint of having substernal burning especially with laying down, also some epigastric discomfort. The patient was seen in the ED and noted to have a hemoglobin of 6.9. On further questioning she reported that over the past year she has had heavy menstruation and has been eating a lot of ice due to these symptoms. She was also having some dyspnea on exertion and I was asked to admit the patient for observation and transfusion. The patient had further work-up including iron studies which showed she was severely iron depleted as a result of her menorrhagia. The patient was transfused 2 units of packed RBCs and was placed on PPIs for her symptoms. If she continues to have GI symptoms, she will be referred for outpatient GI at [facility].    DISCHARGE MEDICATIONS:  1. Iron sulfate 325 p.o. b.i.d.   2. Esomeprazole 40 daily.  3. Maalox 15 mL q.8 p.r.n. heartburn.   HOME OXYGEN: None.   DIET: Regular.   ACTIVITY: As tolerated.   TIMEFRAME FOR FOLLOW UP:  1. 1 to 2 weeks with GYN of choice for menorrhalgia. 2. Follow with [facility] as a new patient for abdominal pain and gastroesophageal reflux disease.   TIME SPENT: 25 minutes.   ____________________________ Lafonda Mosses Posey Pronto, MD shp:drc D: 09/22/2011 11:46:53 ET T: 09/22/2011 14:29:09 ET JOB#: [id number]  cc: Ember Henrikson H. Posey Pronto, MD, <Dictator> Alric Seton MD ELECTRONICALLY SIGNED 09/23/2011 12:18

## 2014-10-12 NOTE — H&P (Signed)
PATIENT NAME:  MERCY, Kara West MR#:  [id number] DATE OF BIRTH:  [birth year]  DATE OF ADMISSION:  09/21/2011  PRIMARY CARE PHYSICIAN: None.   CHIEF COMPLAINT: Substernal burning sensation, as well as epigastric pain, as well as some dyspnea on exertion with noted anemia.   HISTORY OF PRESENT ILLNESS: The patient is a 39 year old female who presents to the ED with complaint of right-sided chest pain, which she describes as burning type of pain. She states that it is constant in nature and has been going on for one week now. She reports that certain foods make the symptoms worse. She has also been having some coughing associated with this, as well as shortness of breath. Lying down makes her symptoms worse. The patient also complains of epigastric sharp pain, which she reports come and go.  In the emergency department, the patient was noted to have a hemoglobin of 6.9 with a MCV of 61. On further questioning, the patient reports that she has been having, over the past 1-1/2 years, heavy menstruations that last greater than one week. She also states that she eats a lot of ice. She otherwise denies any fevers or chills. She does complain of some weakness and dyspnea on exertion.   PAST MEDICAL HISTORY: None.   PAST SURGICAL HISTORY:  1. Status post cesarean section. 2. Status post bilateral tubal ligation.   ALLERGIES: No known drug allergies.   MEDICATIONS: None.  SOCIAL HISTORY: She smokes about 1 pack per week. Drinks occasionally. No drug use.   FAMILY HISTORY: There is no coronary artery disease in the family.   REVIEW OF SYSTEMS: CONSTITUTIONAL: Denies any fevers. Complains of fatigue, weakness, and abdominal pain, as above. No weight loss. No weight gain. EYES: No blurred or double vision. No pain. No redness. No inflammation. No glaucoma. ENT: No tinnitus. No ear pain. No hearing loss. No allergies, seasonal or year-round. No epistaxis. No nasal discharge. No snoring. No postnasal drip. No  sinus pain. RESPIRATORY: No cough. No wheezing. No hemoptysis. No chronic obstructive pulmonary disease. No tuberculosis. CARDIOVASCULAR: Chest pain as above. Complains of some mild dyspnea on exertion. No palpitations. No syncope. GASTROINTESTINAL: No nausea, vomiting, or diarrhea. Complains of epigastric pain. No hematemesis. No melena. No change in bowel habits. No hemorrhoids. GU: Denies any dysuria, hematuria, renal calculus, or frequency. ENDOCRINE: Denies any polydipsia, nocturia, or thyroid problems. HEME/LYMPH: No history of anemia. No easy bruisability or bleeding. SKIN: No acne. No rash. No changes in mole, hair, or skin. MUSCULOSKELETAL: No pain in the neck, back, or shoulder. No gout. NEUROLOGIC: No numbness. No cerebrovascular accident. No transient ischemic attack. No seizures. PSYCHIATRIC: No anxiety. No insomnia. No ADD. No OCD.   PHYSICAL EXAMINATION:   VITAL SIGNS: Temperature 95.7, pulse 74, respirations 18, blood pressure 130/61, and O2 100% on room air.   GENERAL: The patient is a well-developed, African American female in no acute distress.   HEENT: Head atraumatic, normocephalic. Pupils are equal, round, and reactive to light and accommodation. Extraocular movements intact. Oropharynx is clear without any exudates. There is no JVD. No carotid bruits. Nasal exam shows no ulceration or drainage. External ear exam shows no erythema or drainage.   CARDIOVASCULAR: Regular rate and rhythm. No murmurs, rubs, clicks, or gallops. PMI is not displaced.   LUNGS: Clear to auscultation bilaterally without any rales, rhonchi, or wheezing.   ABDOMEN: Soft, nontender, and nondistended. Positive bowel sounds x4.   EXTREMITIES: No clubbing, cyanosis, or edema.   NEUROLOGIC: Awake, alert,  and oriented x3. No focal deficits.   SKIN: No rash.   LYMPHATICS: No lymph nodes palpable.   VASCULAR: Good DP and PT pulses.   LYMPHATICS: No lymph nodes palpable.   PSYCHIATRIC: Not anxious or  depressed.   VASCULAR: Good DP and PT pulses.   EVALUATIONS: Chest x-ray shows no acute cardiopulmonary processes.   EKG shows normal sinus rhythm without any ST-T wave changes.   CPK 62. CK-MB less than 0.5. BMP: Glucose 78, BUN 18, creatinine 0.42, sodium 136, potassium 3.9, chloride 106, CO2 22, and calcium 8.2. LFTs are normal. WBC 3.7, hemoglobin 6.9, and MCV 61. Troponin less than 0.02.   ASSESSMENT AND PLAN: The patient is a 39 year old female who presents with symptomatic anemia along with complaint of some chest burning as well as epigastric discomfort.  1. Microcytic anemia, likely in the setting of heavy menstruation. At this time, we will place the patient on observation and transfuse her with 2 units of packed RBCs. We will check her iron level prior to transfusion. She will need iron therapy. Also recommend outpatient GYN follow-up.  2. Substernal burning and cough, likely related to GERD related symptoms. We will try PPI. If she continues to experience abdominal symptoms, consider evaluation for gallbladder disease.   DISPOSITION: The patient is interested in going home later today after transfusion. Once she is able to tolerate the transfusion, we will discharge her home.          Please note, the patient was explained the risks and benefits of transfusion.   TIME SPENT: 30 minutes.  ____________________________ Lafonda Mosses Posey Pronto, MD shp:slb D: 09/21/2011 10:50:50 ET (Entered as incorrect work type - 09) T: 09/21/2011 14:22:31 ET JOB#: [id number]  cc: Melinda Pottinger H. Posey Pronto, MD, <Dictator> Alric Seton MD ELECTRONICALLY SIGNED 09/23/2011 12:17

## 2014-10-27 ENCOUNTER — Emergency Department (HOSPITAL_COMMUNITY): Payer: Self-pay

## 2014-10-27 ENCOUNTER — Emergency Department (HOSPITAL_COMMUNITY)
Admission: EM | Admit: 2014-10-27 | Discharge: 2014-10-28 | Disposition: A | Discharge status: Home / Self Care | Payer: Self-pay | Attending: Emergency Medicine | Admitting: Emergency Medicine

## 2014-10-27 ENCOUNTER — Encounter (HOSPITAL_COMMUNITY): Payer: Self-pay | Admitting: Emergency Medicine

## 2014-10-27 DIAGNOSIS — R05 Cough: Secondary | ICD-10-CM | POA: Insufficient documentation

## 2014-10-27 DIAGNOSIS — Z3202 Encounter for pregnancy test, result negative: Secondary | ICD-10-CM | POA: Insufficient documentation

## 2014-10-27 DIAGNOSIS — E876 Hypokalemia: Secondary | ICD-10-CM | POA: Insufficient documentation

## 2014-10-27 DIAGNOSIS — D649 Anemia, unspecified: Secondary | ICD-10-CM | POA: Insufficient documentation

## 2014-10-27 DIAGNOSIS — R059 Cough, unspecified: Secondary | ICD-10-CM

## 2014-10-27 DIAGNOSIS — Z79899 Other long term (current) drug therapy: Secondary | ICD-10-CM | POA: Insufficient documentation

## 2014-10-27 DIAGNOSIS — R1110 Vomiting, unspecified: Secondary | ICD-10-CM | POA: Insufficient documentation

## 2014-10-27 DIAGNOSIS — R0789 Other chest pain: Secondary | ICD-10-CM | POA: Insufficient documentation

## 2014-10-27 DIAGNOSIS — Z87891 Personal history of nicotine dependence: Secondary | ICD-10-CM | POA: Insufficient documentation

## 2014-10-27 DIAGNOSIS — R062 Wheezing: Secondary | ICD-10-CM | POA: Insufficient documentation

## 2014-10-27 DIAGNOSIS — R0602 Shortness of breath: Secondary | ICD-10-CM | POA: Insufficient documentation

## 2014-10-27 LAB — URINALYSIS, ROUTINE W REFLEX MICROSCOPIC
Bilirubin Urine: NEGATIVE
GLUCOSE, UA: NEGATIVE mg/dL
Hgb urine dipstick: NEGATIVE
Ketones, ur: NEGATIVE mg/dL
LEUKOCYTES UA: NEGATIVE
Nitrite: NEGATIVE
PROTEIN: NEGATIVE mg/dL
Specific Gravity, Urine: 1.025 (ref 1.005–1.030)
Urobilinogen, UA: 0.2 mg/dL (ref 0.0–1.0)
pH: 6 (ref 5.0–8.0)

## 2014-10-27 LAB — CBC WITH DIFFERENTIAL/PLATELET
BASOS ABS: 0 10*3/uL (ref 0.0–0.1)
Basophils Relative: 0 % (ref 0–1)
Eosinophils Absolute: 0.1 10*3/uL (ref 0.0–0.7)
Eosinophils Relative: 0 % (ref 0–5)
HEMATOCRIT: 44 % (ref 36.0–46.0)
Hemoglobin: 14.7 g/dL (ref 12.0–15.0)
LYMPHS ABS: 2.4 10*3/uL (ref 0.7–4.0)
LYMPHS PCT: 14 % (ref 12–46)
MCH: 29.5 pg (ref 26.0–34.0)
MCHC: 33.4 g/dL (ref 30.0–36.0)
MCV: 88.4 fL (ref 78.0–100.0)
MONO ABS: 0.7 10*3/uL (ref 0.1–1.0)
Monocytes Relative: 4 % (ref 3–12)
Neutro Abs: 13.5 10*3/uL — ABNORMAL HIGH (ref 1.7–7.7)
Neutrophils Relative %: 82 % — ABNORMAL HIGH (ref 43–77)
Platelets: 212 10*3/uL (ref 150–400)
RBC: 4.98 MIL/uL (ref 3.87–5.11)
RDW: 13.4 % (ref 11.5–15.5)
WBC: 16.7 10*3/uL — AB (ref 4.0–10.5)

## 2014-10-27 LAB — COMPREHENSIVE METABOLIC PANEL
ALT: 15 U/L (ref 14–54)
AST: 18 U/L (ref 15–41)
Albumin: 3.7 g/dL (ref 3.5–5.0)
Alkaline Phosphatase: 68 U/L (ref 38–126)
Anion gap: 6 (ref 5–15)
BUN: 13 mg/dL (ref 6–20)
CALCIUM: 8.9 mg/dL (ref 8.9–10.3)
CO2: 19 mmol/L — ABNORMAL LOW (ref 22–32)
CREATININE: 0.59 mg/dL (ref 0.44–1.00)
Chloride: 112 mmol/L — ABNORMAL HIGH (ref 101–111)
GFR calc Af Amer: >60 mL/min (ref >60)
Glucose, Bld: 114 mg/dL — ABNORMAL HIGH (ref 70–99)
Potassium: 3.2 mmol/L — ABNORMAL LOW (ref 3.5–5.1)
Sodium: 137 mmol/L (ref 135–145)
Total Bilirubin: 0.9 mg/dL (ref 0.3–1.2)
Total Protein: 7 g/dL (ref 6.5–8.1)

## 2014-10-27 LAB — POC URINE PREG, ED: PREG TEST UR: NEGATIVE

## 2014-10-27 LAB — LIPASE, BLOOD: LIPASE: 15 U/L — AB (ref 22–51)

## 2014-10-27 MED ORDER — ALBUTEROL SULFATE (2.5 MG/3ML) 0.083% IN NEBU
5.0000 mg | INHALATION_SOLUTION | Freq: Once | RESPIRATORY_TRACT | Status: AC
  Administered 2014-10-28: 5 mg via RESPIRATORY_TRACT
  Filled 2014-10-27: qty 6

## 2014-10-27 MED ORDER — IOHEXOL 350 MG/ML SOLN
100.0000 mL | Freq: Once | INTRAVENOUS | Status: AC | PRN
  Administered 2014-10-27: 100 mL via INTRAVENOUS

## 2014-10-27 MED ORDER — ONDANSETRON HCL 4 MG/2ML IJ SOLN
4.0000 mg | Freq: Once | INTRAMUSCULAR | Status: AC
  Administered 2014-10-27: 4 mg via INTRAVENOUS
  Filled 2014-10-27: qty 2

## 2014-10-27 MED ORDER — METHYLPREDNISOLONE SODIUM SUCC 125 MG IJ SOLR
125.0000 mg | Freq: Once | INTRAMUSCULAR | Status: AC
  Administered 2014-10-27: 125 mg via INTRAVENOUS
  Filled 2014-10-27: qty 2

## 2014-10-27 MED ORDER — SODIUM CHLORIDE 0.9 % IV BOLUS (SEPSIS)
1000.0000 mL | Freq: Once | INTRAVENOUS | Status: AC
  Administered 2014-10-27: 1000 mL via INTRAVENOUS

## 2014-10-27 MED ORDER — ALBUTEROL SULFATE (2.5 MG/3ML) 0.083% IN NEBU
5.0000 mg | INHALATION_SOLUTION | Freq: Once | RESPIRATORY_TRACT | Status: AC
  Administered 2014-10-27: 5 mg via RESPIRATORY_TRACT
  Filled 2014-10-27: qty 6

## 2014-10-27 MED ORDER — IPRATROPIUM BROMIDE 0.02 % IN SOLN
0.5000 mg | Freq: Once | RESPIRATORY_TRACT | Status: AC
  Administered 2014-10-27: 0.5 mg via RESPIRATORY_TRACT
  Filled 2014-10-27: qty 2.5

## 2014-10-27 MED ORDER — IPRATROPIUM BROMIDE 0.02 % IN SOLN
0.5000 mg | Freq: Once | RESPIRATORY_TRACT | Status: AC
  Administered 2014-10-28: 0.5 mg via RESPIRATORY_TRACT
  Filled 2014-10-27: qty 2.5

## 2014-10-27 MED ORDER — HYDROCODONE-HOMATROPINE 5-1.5 MG/5ML PO SYRP
5.0000 mL | ORAL_SOLUTION | Freq: Once | ORAL | Status: AC
  Administered 2014-10-27: 5 mL via ORAL
  Filled 2014-10-27: qty 5

## 2014-10-27 NOTE — ED Notes (Signed)
IV RN bedside.

## 2014-10-27 NOTE — ED Notes (Signed)
CT notified patient is ready for CT. 

## 2014-10-27 NOTE — ED Notes (Signed)
PER CT staff, pt's 20g IV infiltrated during procedure. IV removed. Warm compress applied.  Orvil Feil, Pa made aware. Reported to notify IV team for another IV start for procedure.

## 2014-10-27 NOTE — ED Notes (Signed)
RT notified of pending orders.  

## 2014-10-27 NOTE — ED Notes (Signed)
Patient returned from CT

## 2014-10-27 NOTE — ED Notes (Signed)
PA at bedside.

## 2014-10-27 NOTE — ED Notes (Signed)
Patient transported to CT 

## 2014-10-27 NOTE — ED Notes (Signed)
Pt c/o cough and emesis x 2 weeks, last episode of emesis today. Pt states she was diagnosed with bronchitis 9 months ago and still has bronchitis.

## 2014-10-27 NOTE — ED Provider Notes (Signed)
CSN: [id number]     Arrival date & time 10/27/14  1454 History   First MD Initiated Contact with Patient 10/27/14 1924     Chief Complaint  Patient presents with  . Cough     (Consider location/radiation/quality/duration/timing/severity/associated sxs/prior Treatment) The history is provided by the patient and medical records. No language interpreter was used.     Kara West is a 39 y.o. female  with a hx of anemia presents to the Emergency Department complaining of gradual, persistent, progressively worsening cough, SOB onset 3 days ago.  Pt reports she has stopped smoking but her SOB has worsened. She reports she stopped smoking in Aug 2015 after being diagnosed with bronchitis.  Pt reports she was given prednisone, azithromycin, Tessalon, albuterol to treat it, which made a significant difference.  She reports she continued using the inhaler with adequate relief but she ran out 2 days ago. 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  Laying flat on her back makes her breathing significantly worse.  She denies fever, chills, headache, neck pain. Pt also c/o vomiting over the last several days that is post-tussive in nature.  She reports that it is stomach contents and is NBNB.  Pt reports that movement also makes it worse.  Pt denies fever, chills, headache, neck pain, chest pain, abdominal pain, diarrhea, weakness, dizziness, syncope, dysuria, hematuria.  Patient denies unilateral leg swelling, recent travel, recent surgery, recent broken bones, leg pain, estrogen usage, history of DVT.   Past Medical History  Diagnosis Date  . Anemia    Past Surgical History  Procedure Laterality Date  . Cesarean section    . Tubal ligation    . Dilation and curettage of uterus     History reviewed. No pertinent family history. History  Substance Use Topics  . Smoking status: Former Smoker    Types: Cigarettes    Quit date: 06/11/2014  . Smokeless tobacco:  Never Used  . Alcohol Use: No   OB History    No data available     Review of Systems  Constitutional: Negative for fever, diaphoresis, appetite change, fatigue and unexpected weight change.  HENT: Negative for mouth sores.   Eyes: Negative for visual disturbance.  Respiratory: Positive for cough, chest tightness, shortness of breath and wheezing.   Cardiovascular: Negative for chest pain.  Gastrointestinal: Positive for vomiting ( Posttussive emesis). Negative for nausea, abdominal pain, diarrhea and constipation.  Endocrine: Negative for polydipsia, polyphagia and polyuria.  Genitourinary: Negative for dysuria, urgency, frequency and hematuria.  Musculoskeletal: Negative for back pain and neck stiffness.  Skin: Negative for rash.  Allergic/Immunologic: Negative for immunocompromised state.  Neurological: Negative for syncope, light-headedness and headaches.  Hematological: Does not bruise/bleed easily.  Psychiatric/Behavioral: Negative for sleep disturbance. The patient is not nervous/anxious.       Allergies  Review of patient's allergies indicates no known allergies.  Home Medications   Prior to Admission medications   Medication Sig Start Date End Date Taking? Authorizing Provider  ferrous fumarate (HEMOCYTE - 106 MG FE) 325 (106 FE) MG TABS tablet Take 1 tablet by mouth daily.   Yes Historical Provider, MD  albuterol (PROVENTIL HFA;VENTOLIN HFA) 108 (90 BASE) MCG/ACT inhaler Inhale 2 puffs into the lungs every 4 (four) hours as needed for wheezing or shortness of breath. 10/28/14   Avon Mergenthaler, PA-C  albuterol (PROVENTIL) (2.5 MG/3ML) 0.083% nebulizer solution Take 3 mLs (2.5 mg total) by nebulization every 4 (four)  hours as needed for wheezing or shortness of breath. 10/28/14   Simmie Garin, PA-C  azithromycin (ZITHROMAX) 250 MG tablet Take 1 tablet (250 mg total) by mouth daily. Take first 2 tablets together, then 1 every day until finished. 10/28/14   Luberta Grabinski, PA-C  benzonatate (TESSALON) 100 MG capsule Take 2 capsules (200 mg total) by mouth 2 (two) times daily as needed for cough. 10/28/14   Hollace Michelli, PA-C  guaiFENesin-codeine 100-10 MG/5ML syrup Take 5 mLs by mouth 3 (three) times daily as needed for cough. Patient not taking: Reported on 10/27/2014 07/02/14   Domenic Moras, PA-C  predniSONE (DELTASONE) 20 MG tablet Take 2 tablets (40 mg total) by mouth daily. 10/28/14   Veverly Larimer, PA-C   BP 105/62 mmHg  Pulse 92  Temp(Src)   Resp 18  SpO2 98%  LMP 10/11/2014 (Approximate) Physical Exam  Constitutional: She is oriented to person, place, and time. She appears well-developed and well-nourished. No distress.  Awake, alert, nontoxic appearance  HENT:  Head: Normocephalic and atraumatic.  Right Ear: Tympanic membrane, external ear and ear canal normal.  Left Ear: Tympanic membrane, external ear and ear canal normal.  Nose: Mucosal edema and rhinorrhea present. No epistaxis. Right sinus exhibits no maxillary sinus tenderness and no frontal sinus tenderness. Left sinus exhibits no maxillary sinus tenderness and no frontal sinus tenderness.  Mouth/Throat: Uvula is midline, oropharynx is clear and moist and mucous membranes are normal. Mucous membranes are not pale and not cyanotic. No oropharyngeal exudate, posterior oropharyngeal edema, posterior oropharyngeal erythema or tonsillar abscesses.  Eyes: Conjunctivae are normal. Pupils are equal, round, and reactive to light. No scleral icterus.  Neck: Normal range of motion and full passive range of motion without pain. Neck supple.  Cardiovascular: Normal rate, regular rhythm, normal heart sounds and intact distal pulses.   No murmur heard. Pulmonary/Chest: Effort normal. No stridor. No respiratory distress. She has wheezes.  Inspiratory and expiratory wheezes throughout Course cough  Abdominal: Soft. Bowel sounds are normal. She exhibits no mass. There is no tenderness.  There is no rebound and no guarding.  Musculoskeletal: Normal range of motion. She exhibits no edema.  Lymphadenopathy:    She has no cervical adenopathy.  Neurological: She is alert and oriented to person, place, and time.  Speech is clear and goal oriented Moves extremities without ataxia  Skin: Skin is warm and dry. No rash noted. She is not diaphoretic.  Psychiatric: She has a normal mood and affect.  Nursing note and vitals reviewed.   ED Course  Procedures (including critical care time) Labs Review Labs Reviewed  CBC WITH DIFFERENTIAL/PLATELET - Abnormal; Notable for the following:    WBC 16.7 (*)    Neutrophils Relative % 82 (*)    Neutro Abs 13.5 (*)    All other components within normal limits  COMPREHENSIVE METABOLIC PANEL - Abnormal; Notable for the following:    Potassium 3.2 (*)    Chloride 112 (*)    CO2 19 (*)    Glucose, Bld 114 (*)    All other components within normal limits  LIPASE, BLOOD - Abnormal; Notable for the following:    Lipase 15 (*)    All other components within normal limits  URINALYSIS, ROUTINE W REFLEX MICROSCOPIC - Abnormal; Notable for the following:    Color, Urine AMBER (*)    APPearance CLOUDY (*)    All other components within normal limits  POC URINE PREG, ED    Imaging Review Dg  Chest 2 View  10/27/2014   CLINICAL DATA:  Cough and vomiting for the past 2 weeks.  Ex-smoker.  EXAM: CHEST  2 VIEW  COMPARISON:  09/09/2014.  FINDINGS: Normal sized heart. Clear lungs. Mild diffuse peribronchial thickening without significant change. Unremarkable bones.  IMPRESSION: Stable mild chronic bronchitic changes.  No acute abnormality.   Electronically Signed   By: Claudie Revering M.D.   On: 10/27/2014 20:26   Ct Angio Chest Pe W/cm &/or Wo Cm  10/27/2014   CLINICAL DATA:  Cough and emesis for 2 weeks.  EXAM: CT ANGIOGRAPHY CHEST WITH CONTRAST  TECHNIQUE: Multidetector CT imaging of the chest was performed using the standard protocol during bolus  administration of intravenous contrast. Multiplanar CT image reconstructions and MIPs were obtained to evaluate the vascular anatomy.  CONTRAST:  136mL OMNIPAQUE IOHEXOL 350 MG/ML SOLN  COMPARISON:  Radiographs 10/27/2014,  FINDINGS: Cardiovascular: There is good opacification of the pulmonary arteries. There is no pulmonary embolism. The thoracic aorta is normal in caliber and intact.  Lungs: There is mild bronchial thickening consistent with bronchitis. There is no confluent airspace opacity. There is no mass or nodule.  Central airways: Patent  Effusions: None  Lymphadenopathy: Mildly prominent hilar and mediastinal nodes, nonspecific but possibly reactive.  Esophagus: Unremarkable  Upper abdomen: No significant abnormality  Musculoskeletal: No significant abnormality  Review of the MIP images confirms the above findings.  IMPRESSION: Negative for pulmonary embolism. There is mild bronchial thickening consistent with bronchitis.   Electronically Signed   By: Andreas Newport M.D.   On: 10/27/2014 23:43     EKG Interpretation None      MDM   Final diagnoses:  Cough  SOB (shortness of breath)  Post-tussive emesis  Hypokalemia   [name] presents with cough, chest congestion, posttussive emesis. On exam she is tachycardic and wheezing.  Leukocytosis noted. Labs with mild hypokalemia but otherwise reassuring.  Will obtain CT angio to r/o PE.    1:08 AM Patient with breathing treatment 2, slight mitral given here in the emergency department. Now without wheezing. Improvement in vital signs. CT angio chest without evidence of PE.  Patient is tolerating by mouth without difficulty.  No further emesis here in the emergency department. Mild hypokalemia, repleted here in the department.  Patient ambulated in ED with O2 saturations maintained >90, no current signs of respiratory distress. Lung exam improved after nebulizer treatment. Prednisone given in the ED and pt will bd dc with 5 day  burst. Pt states they are breathing at baseline. Pt has been instructed to continue using prescribed medications and to speak with PCP about today's exacerbation.   I have personally reviewed patient's vitals, nursing note and any pertinent labs or imaging.  I performed an undressed physical exam.    It has been determined that no acute conditions requiring further emergency intervention are present at this time. The patient/guardian have been advised of the diagnosis and plan. I reviewed all labs and imaging including any potential incidental findings. We have discussed signs and symptoms that warrant return to the ED and they are listed in the discharge instructions.    Vital signs are stable at discharge.   BP 105/62 mmHg  Pulse 92  Temp(Src)   Resp 18  SpO2 98%  LMP 10/11/2014 (Approximate)          [facility], PA-C 10/28/14 8299  Lacretia Leigh, MD 11/02/14 1101

## 2014-10-28 MED ORDER — PREDNISONE 20 MG PO TABS: 40.0000 mg | ORAL_TABLET | Freq: Every day | ORAL | Status: DC

## 2014-10-28 MED ORDER — ALBUTEROL SULFATE HFA 108 (90 BASE) MCG/ACT IN AERS: 2.0000 | INHALATION_SPRAY | RESPIRATORY_TRACT | Status: DC | PRN

## 2014-10-28 MED ORDER — AEROCHAMBER Z-STAT PLUS/MEDIUM MISC
1.0000 | Freq: Once | Status: AC
  Administered 2014-10-28: 1

## 2014-10-28 MED ORDER — BENZONATATE 100 MG PO CAPS: 200.0000 mg | ORAL_CAPSULE | Freq: Two times a day (BID) | ORAL | Status: DC | PRN

## 2014-10-28 MED ORDER — AZITHROMYCIN 250 MG PO TABS: 250.0000 mg | ORAL_TABLET | Freq: Every day | ORAL | Status: DC

## 2014-10-28 MED ORDER — ALBUTEROL SULFATE HFA 108 (90 BASE) MCG/ACT IN AERS
2.0000 | INHALATION_SPRAY | RESPIRATORY_TRACT | Status: DC | PRN
  Administered 2014-10-28: 2 via RESPIRATORY_TRACT
  Filled 2014-10-28: qty 6.7

## 2014-10-28 MED ORDER — ALBUTEROL SULFATE (2.5 MG/3ML) 0.083% IN NEBU: 2.5000 mg | INHALATION_SOLUTION | RESPIRATORY_TRACT | Status: DC | PRN

## 2014-10-28 MED ORDER — POTASSIUM CHLORIDE CRYS ER 20 MEQ PO TBCR
40.0000 meq | EXTENDED_RELEASE_TABLET | Freq: Once | ORAL | Status: AC
  Administered 2014-10-28: 40 meq via ORAL
  Filled 2014-10-28: qty 2

## 2014-10-28 NOTE — Discharge Instructions (Signed)
1. Medications: Albuterol, azithromycin, prednisone, tessalon, usual home medications 2. Treatment: rest, drink plenty of fluids, take tylenol or ibuprofen for fever control 3. Follow Up: Please followup with your primary doctor or the [facility] in 1-3 days for discussion of your diagnoses and further evaluation after today's visit; if you do not have a primary care doctor use the resource guide provided to find one; Return to the ER for high fevers, difficulty breathing or other concerning symptoms    Bronchospasm A bronchospasm is when the tubes that carry air in and out of your lungs (airways) spasm or tighten. During a bronchospasm it is hard to breathe. This is because the airways get smaller. A bronchospasm can be triggered by:  Allergies. These may be to animals, pollen, food, or mold.  Infection. This is a common cause of bronchospasm.  Exercise.  Irritants. These include pollution, cigarette smoke, strong odors, aerosol sprays, and paint fumes.  Weather changes.  Stress.  Being emotional. HOME CARE   Always have a plan for getting help. Know when to call your doctor and local emergency services (911 in the U.S.). Know where you can get emergency care.  Only take medicines as told by your doctor.  If you were prescribed an inhaler or nebulizer machine, ask your doctor how to use it correctly. Always use a spacer with your inhaler if you were given one.  Stay calm during an attack. Try to relax and breathe more slowly.  Control your home environment:  Change your heating and air conditioning filter at least once a month.  Limit your use of fireplaces and wood stoves.  Do not  smoke. Do not  allow smoking in your home.  Avoid perfumes and fragrances.  Get rid of pests (such as roaches and mice) and their droppings.  Throw away plants if you see mold on them.  Keep your house clean and dust free.  Replace carpet with wood, tile, or vinyl flooring.  Carpet can trap dander and dust.  Use allergy-proof pillows, mattress covers, and box spring covers.  Wash bed sheets and blankets every week in hot water. Dry them in a dryer.  Use blankets that are made of polyester or cotton.  Wash hands frequently. GET HELP IF:  You have muscle aches.  You have chest pain.  The thick spit you spit or cough up (sputum) changes from clear or white to yellow, green, gray, or bloody.  The thick spit you spit or cough up gets thicker.  There are problems that may be related to the medicine you are given such as:  A rash.  Itching.  Swelling.  Trouble breathing. GET HELP RIGHT AWAY IF:  You feel you cannot breathe or catch your breath.  You cannot stop coughing.  Your treatment is not helping you breathe better.  You have very bad chest pain. MAKE SURE YOU:   Understand these instructions.  Will watch your condition.  Will get help right away if you are not doing well or get worse. Document Released: 04/03/2009 Document Revised: 06/11/2013 Document Reviewed: 11/27/2012 [facility] Patient Information 2015 [place], [place]. This information is not intended to replace advice given to you by your health care provider. Make sure you discuss any questions you have with your health care provider.    Emergency Department Resource Guide 1) Find a Doctor and Pay Out of Pocket Although you won't have to find out who is covered by your insurance plan, it is a good idea to  ask around and get recommendations. You will then need to call the office and see if the doctor you have chosen will accept you as a new patient and what types of options they offer for patients who are self-pay. Some doctors offer discounts or will set up payment plans for their patients who do not have insurance, but you will need to ask so you aren't surprised when you get to your appointment.  2) Contact Your Local Health Department Not all health departments have doctors  that can see patients for sick visits, but many do, so it is worth a call to see if yours does. If you don't know where your local health department is, you can check in your phone book. The CDC also has a tool to help you locate your state's health department, and many state websites also have listings of all of their local health departments.  3) Find a [facility] If your illness is not likely to be very severe or complicated, you may want to try a walk in clinic. These are popping up all over the country in pharmacies, drugstores, and shopping centers. They're usually staffed by nurse practitioners or physician assistants that have been trained to treat common illnesses and complaints. They're usually fairly quick and inexpensive. However, if you have serious medical issues or chronic medical problems, these are probably not your best option.  No Primary Care Doctor: - Call Health Connect at  [phone] - they can help you locate a primary care doctor that  accepts your insurance, provides certain services, etc. - Physician Referral Service- [phone]  Chronic Pain Problems: Organization         Address  Phone   Notes  [facility]  [phone] Patients need to be referred by their primary care doctor.   Medication Assistance: Organization         Address  Phone   Notes  [facility] Medication [facility] [address] [place], [place] [address] [phone] --Must be a resident of [facility] -- Must have NO insurance coverage whatsoever (no Medicaid/ Medicare, etc.) -- The pt. MUST have a primary care doctor that directs their care regularly and follows them in the community   [facility]ist  [phone]   [employer]  [phone]    Agencies that provide inexpensive medical care: Organization         Address  Phone   Notes  [place]  [phone]   [facility]    [phone]   [facility] [place], [place] [address] [phone]   [place] [address], [place] [phone]   Planned Parenthood    [phone]   [facility]    [phone]   [place] [address], [place] Phone:  [phone], Fax:  [phone] Hours of Operation:  9 am - 6 pm, M-F.  Also accepts Medicaid/Medicare and self-pay.  [facility] for [address], [address], [place] Phone: [phone], Fax: [phone]. Hours of Operation:  8:30 am - 5:30 pm, M-F.  Also accepts Medicaid and self-pay.  [facility] [address], [place] Phone: [phone]   [place], [place], [place] [phone], Ext. [phone] Mondays & Thursdays: 7-9 AM.  First 15 patients are  seen on a first come, first serve basis.    [place] Providers:  Organization         Address  Phone   Notes  [facility] [address], [place] [phone] Also accepts self-pay patients.  [facility] [address], [place]  [phone]   [place], [address], [place] [phone]   [facility] [address], [place] [phone]   Lucianne Lei [address], [place]   [phone] Only accepts [place] Access [place] patients after they have their name applied to their card.   Self-Pay (no insurance) in [facility]:  Organization         Address  Phone   Notes  Sickle Cell Patients, [facility] Internal Medicine [place] [phone]   [facility] [place] [phone]   [facility] [place]  [place], [place], [place] [phone]   [facility]/Dr.  Osei-Bonsu  [address] [place] or [address], [place] [phone] Phone number for both [place] and [place] locations is the same.  [facility] [address], [place] [phone]   [facility] [address] [place] or [address] [phone]   [facility] [address], [place] [phone], phone; [phone], fax Sees patients 1st and 3rd Saturday of every month.  Must not qualify for public or private insurance (i.e. Medicaid, Medicare, [place] Health Choice, Veterans' Benefits)  Household income should be no more than 200% of the poverty level The clinic cannot treat you if you are pregnant or think you are pregnant  Sexually transmitted diseases are not treated at the clinic.    Dental Care: Organization         Address  Phone  Notes  [facility] Department of [facility] [phone] Accepts children up to age 56 who are enrolled in [place] or [place]; pregnant women with a Medicaid card; and children who have applied for Medicaid or [place] Health Choice, but were declined, whose parents can pay a reduced fee at time of service.  [facility] Department of [facility]  [address], [place] [phone] Accepts children up to age 25 who are enrolled in [place] or [place]; pregnant women with a Medicaid card; and children who have applied for Medicaid or [place] Health Choice, but were declined, whose parents can pay a reduced fee at time of service.  [place] Adult Dental Access PROGRAM  [place] [phone] Patients are seen by appointment only. Walk-ins are not accepted. [place] will see patients 31 years of age and older. Monday - Tuesday (8am-5pm) Most Wednesdays (8:30-5pm) $30 per visit, cash only  [facility]  Dental Access PROGRAM  [address], [facility] [phone] Patients are seen by appointment only. Walk-ins are not accepted. [place] will see patients 33 years of age and older. One Wednesday Evening (Monthly: Volunteer Based).  $30 per visit, cash only  [place]  [phone] for adults; Children under age 45, call Graduate Pediatric Dentistry at [phone])  [phone]. Children aged 87-14, please call [phone] to request a pediatric application.  Dental services are provided in all areas of dental care including fillings, crowns and bridges, complete and partial dentures, implants, gum treatment, root canals, and extractions. Preventive care is also provided. Treatment is provided to both adults and children. Patients are selected via a lottery and there is often a waiting list.   [facility] [address] [place]  [phone] [email or web address]   [facility] [address] [place], [place] [phone], Ext. [phone] Second and Fourth Thursday of each month, opens at 6:30 AM; Clinic ends at 9 AM.  Patients are seen on a first-come first-served basis, and a limited number are seen during each clinic.   [facility]  [address] [place] Danker [place], [place] [phone]   Eligibility Requirements You must have lived in [place], [place], or [place] counties for at least the last three months.   You cannot be eligible for state or federal sponsored [employer], including [employer], [place], or [employer].   You generally cannot be eligible for healthcare insurance through your employer.    How to apply: Eligibility screenings are held every Tuesday and Wednesday afternoon from 1:00 pm until 4:00 pm. You do not need an appointment for the interview!  [facility] [address] [place], [place]   [place]  [place] Department  [place]  [phone]    Behavioral Health Resources in the Community: Intensive Outpatient Programs Organization         Address  Phone  Notes  [place]. [address], [place], [place] [phone]   [facility] Outpatient [address] [place], [place]   ADS: [facility] [address] [place], [place]   [place] [address],  [place], [place] or [phone]   Substance Abuse Resources Organization         Address  Phone  Notes  Alcohol and Drug Services  [phone]   [place]  [phone]   [facility]   Chinita Pester  [phone]   Residential & Outpatient Substance Abuse Program  [phone]   Psychological Services Organization         Address  Phone  Notes  [facility] [place]  [place]  [phone]   [place] [address], [place] or [phone]    Mobile Crisis Teams Organization         Address  Phone  Notes  [facility], Mobile Crisis Care Unit  [phone]   [facility]  [address] [place], [place]   [place] Levels [address], [place] [phone]    Self-Help/Support Groups Organization         Address  Phone             Notes  [place]. of [place] - variety of support groups  [place] Call for more information  Narcotics Anonymous (NA), [facility] [address], [place]  2 meetings at this location   Special educational needs teacher         Address  Phone  Notes  [facility]idential Treatment [place],    [place]  [phone]  [facility]  [address] [place] [phone], [place], [place]   [place] [employer], [place] [phone] Admissions: 8am-3pm M-F  Incentives Substance [place] [address]    [place], [place] [phone]   [facility] [address] [place], [place], [place]   [facility] [address],  [place], [place]   [facility] Programs - Intensive Outpatient [address] Krist[address], [place], [place]   [facility] [address].,  [place], [place] [phone] or [phone]   [facility] (RTS) [address] [place], [place] Accepts Medicaid  [facility] [place] [address]  [place] [phone] Substance Abuse/Addiction Treatment   [facility] Organization         Address  Phone  Notes  [facility]  [phone]   Domenic Schwab, PhD [address] [place], [place]   [phone] or [phone]   [place], [place] [phone]   [facility] [address] [place], [place] [phone] Insurance/Medicaid/sponsorship through [facility] and Families [address], [address] [place]                                    [place], [place] [phone] [place] [address], [place] [phone]    Dr. Adele Schilder  [phone]   [facility] Dept. 1) [address] [place] 2) [place] 3)  [place] [address], [place] [phone]  [phone]   [place] [phone] or [phone] (After Hours)

## 2014-10-30 ENCOUNTER — Ambulatory Visit: Payer: Self-pay | Attending: Physician Assistant | Admitting: Physician Assistant

## 2014-10-30 VITALS — BP 110/78 | HR 82 | Temp 98.1°F | Wt 194.2 lb

## 2014-10-30 DIAGNOSIS — J441 Chronic obstructive pulmonary disease with (acute) exacerbation: Secondary | ICD-10-CM

## 2014-10-30 NOTE — Progress Notes (Addendum)
Kara West  VEL:[id number]  CHE:[id number]  DOB - 01-06-1976  Chief Complaint  Patient presents with  . New patient established care  . Bronchitis       Subjective:   Kara West is a 39 y.o. female here today for establishment of care. She is status post an emergency department visit on 10/27/2014. She presented to the local emergency department with increasing cough productive of yellow sputum, congestion, wheezing, and dyspnea. She had no fevers. She had no chest pain. Imaging studies were with evidence of chronic changes, nothing acute. No pneumonia. She was diagnosed with an acute bronchitis. She was treated with nebs in the emergency department. She also was sent home on nebs, antibiotics, steroids, and Tessalon. Did not get these prescriptions filled secondary to cost. She is planning on picking them up today. She feels a little better. She still little dyspneic. Occasionally she still wheezes.  ROS: Gen: denies fever or chills, denies change in weight Skin: denies lesions or rashes HEENT: denies headache, earache, epistaxis, +sore throat, or neck pain LUNGS: + SHOB, +dyspnea, PND, orthopnea CV: denies CP or palpitations   No Known Allergies  PAST MEDICAL HISTORY: Past Medical History  Diagnosis Date  . Anemia     PAST SURGICAL HISTORY: Past Surgical History  Procedure Laterality Date  . Cesarean section    . Tubal ligation    . Dilation and curettage of uterus      MEDICATIONS AT HOME: Prior to Admission medications   Medication Sig Start Date End Date Taking? Authorizing Provider  albuterol (PROVENTIL HFA;VENTOLIN HFA) 108 (90 BASE) MCG/ACT inhaler Inhale 2 puffs into the lungs every 4 (four) hours as needed for wheezing or shortness of breath. 10/28/14  Yes Hannah Muthersbaugh, PA-C  albuterol (PROVENTIL) (2.5 MG/3ML) 0.083% nebulizer solution Take 3 mLs (2.5 mg total) by nebulization every 4 (four) hours as needed for wheezing or shortness of breath.  10/28/14  Yes Hannah Muthersbaugh, PA-C  azithromycin (ZITHROMAX) 250 MG tablet Take 1 tablet (250 mg total) by mouth daily. Take first 2 tablets together, then 1 every day until finished. Patient not taking: Reported on 10/30/2014 10/28/14   Jarrett Soho Muthersbaugh, PA-C  benzonatate (TESSALON) 100 MG capsule Take 2 capsules (200 mg total) by mouth 2 (two) times daily as needed for cough. Patient not taking: Reported on 10/30/2014 10/28/14   Jarrett Soho Muthersbaugh, PA-C  ferrous fumarate (HEMOCYTE - 106 MG FE) 325 (106 FE) MG TABS tablet Take 1 tablet by mouth daily.    Historical Provider, MD  guaiFENesin-codeine 100-10 MG/5ML syrup Take 5 mLs by mouth 3 (three) times daily as needed for cough. Patient not taking: Reported on 10/27/2014 07/02/14   Domenic Moras, PA-C  predniSONE (DELTASONE) 20 MG tablet Take 2 tablets (40 mg total) by mouth daily. Patient not taking: Reported on 10/30/2014 10/28/14   Jarrett Soho Muthersbaugh, PA-C     Objective:   Filed Vitals:   10/30/14 0912  BP: 110/78  Pulse: 82  Temp: 98.1 F (36.7 C)  Weight: 194 lb 3.2 oz (88.089 kg)  SpO2: 98%    Exam General appearance : Awake, alert, not in any distress. Speech Clear. Not toxic looking HEENT: Atraumatic and Normocephalic, pupils equally reactive to light and accomodation Neck: supple, no JVD. No cervical lymphadenopathy.  Chest:Good air entry bilaterally, scant exp wheezes CVS: S1 S2 regular, no murmurs.   Wounds:N/A   Assessment & Plan  1. Acute on Chronic Bronchitis  -Fill script for ABX, Tessalon, Steroid, Nebs today  -  Encouraged compliance  -Cont smoking cessation (abstinent * 2 months) 2. Anemia-stable    Return in about 6 weeks (around 12/11/2014). At that time discuss the need for maintenance drugs if necessary.   The patient was given clear instructions to go to ER or return to medical center if symptoms don't improve, worsen or new problems develop. The patient verbalized understanding. The patient was told  to call to get lab results if they haven't heard anything in the next week.   This note has been created with Surveyor, quantity. Any transcriptional errors are unintentional.    Zettie Pho, PA-C [facility] and [facility] [place], [place]   10/30/2014, 9:23 AM

## 2014-10-30 NOTE — Patient Instructions (Signed)
Please pick up meds today! No smoking! See you back in 6 weeks Call us if you need [place] before then

## 2014-11-28 ENCOUNTER — Ambulatory Visit: Payer: Self-pay | Admitting: Family Medicine

## 2014-12-01 ENCOUNTER — Ambulatory Visit: Payer: Self-pay | Attending: Family Medicine | Admitting: Family Medicine

## 2014-12-01 VITALS — BP 109/74 | HR 80 | Temp 98.0°F | Resp 18 | Ht 67.0 in | Wt 194.8 lb

## 2014-12-01 DIAGNOSIS — J069 Acute upper respiratory infection, unspecified: Secondary | ICD-10-CM

## 2014-12-01 DIAGNOSIS — J441 Chronic obstructive pulmonary disease with (acute) exacerbation: Secondary | ICD-10-CM

## 2014-12-01 MED ORDER — BENZONATATE 100 MG PO CAPS: 100.0000 mg | ORAL_CAPSULE | Freq: Three times a day (TID) | ORAL | Status: DC

## 2014-12-01 MED ORDER — AMOXICILLIN 500 MG PO CAPS: 500.0000 mg | ORAL_CAPSULE | Freq: Three times a day (TID) | ORAL | Status: DC

## 2014-12-01 MED ORDER — PREDNISONE 20 MG PO TABS: ORAL_TABLET | ORAL | Status: DC

## 2014-12-01 NOTE — Progress Notes (Signed)
Patient is here for her bronchitis. Patient would also like to establish care with a PCP. Patient reports that her bronchitis is "tearing her nerves up." Patient has had bronchitis for 8 months. Patient reports wheezing and coughing all night and weekend. Patient wanted to go to the hospital this weekend due to it, but did not. Patient reports that she uses her inhaler but it does not help. Patient is having throat pain rated at a 9. Patient reports that pain as sore and it "feels raw." Patient reports that the pain is constant and start 4 days ago. Patient reports that she has not ate in 2 day due to the pain, but has been drinking lots of water. Patient reports that on Thursday while at work she was coughing and felt light headed and almost fell. Patient also reports that both of her feet have been swelling. Patient noticed the swelling on Saturday and they were swollen all day Saturday and Sunday. Patient reports here feet pain at an 8 and repots it being aching pain. Patient states that the pain comes and goes.

## 2014-12-01 NOTE — Progress Notes (Signed)
Subjective:     Patient ID: Kara West, female   DOB: 1976-01-26, 39 y.o.   MRN: [id number]  HPI Patient with a history of COPD, presents with a one week history of increased SOB and coughing.  She also reports ST and nasal congestion. She reports some wheezing.The last time she had a flare was several months ago and she was treated with antibiotic and prednisone. Review of Systems  Denies fever, chills,      Objective:   Physical Exam   Alert, oriented, appropriate Skin is warm and dry TMS are clear, nose is congested, throat is minorly inflammed, voice is hoarse Neck is supple FROM w/o adenopathy or tenderness. Lungs have faint high pitched wheezes throughout. HS are regular w/o m,g,r.     Assessment:     URI with COPD exacerbation    Plan:   Amoxicillin 500 mg. #30, one po tid for 10 days Prednisone 20 mg, #10. Take 2 a day for 5 days. Tessalon perles #30, one po tid prn Use albuterol inhaler as needed Keep a diary of symptoms and bring to next appointment in one month.   Micheline Chapman, FNP-BC

## 2014-12-01 NOTE — Patient Instructions (Signed)
Take medications as prescribed. Keep a diary of symptoms and return to see assigned primary provider in one month.

## 2014-12-25 ENCOUNTER — Telehealth: Payer: Self-pay | Admitting: Family Medicine

## 2014-12-25 NOTE — Telephone Encounter (Signed)
Returned call to patient She called the nurse line on 12/23/14 in the evening with SOB complaint. Recently treated for bronchitis  Left VM Advised patient call back to speak to nurse triage and call to see if she could be worked into the schedule today or tomorrow.

## 2014-12-29 ENCOUNTER — Ambulatory Visit: Payer: Self-pay | Admitting: Family Medicine

## 2015-01-15 ENCOUNTER — Ambulatory Visit: Payer: Self-pay | Admitting: Internal Medicine

## 2015-02-04 ENCOUNTER — Encounter: Payer: Self-pay | Admitting: Family Medicine

## 2015-02-04 ENCOUNTER — Ambulatory Visit (INDEPENDENT_AMBULATORY_CARE_PROVIDER_SITE_OTHER): Payer: PRIVATE HEALTH INSURANCE | Admitting: Family Medicine

## 2015-02-04 VITALS — BP 118/68 | HR 88 | Temp 98.5°F | Resp 16 | Ht 67.0 in | Wt 198.1 lb

## 2015-02-04 DIAGNOSIS — J441 Chronic obstructive pulmonary disease with (acute) exacerbation: Secondary | ICD-10-CM

## 2015-02-04 NOTE — Progress Notes (Signed)
Name: Kara West   MRN: [id number]    DOB: 06/29/1975   Date:02/04/2015       Progress Note  Subjective  Chief Complaint  Chief Complaint  Patient presents with  . Hospitalization Follow-up    pt has been hospitalized multiple times in last year for bronchitis    HPI Pt. Presents for ER follow up. She has had multiple episodes of productive cough, wheezing, and dyspnea over the last year, some of which were severe enough to require presentation to the ER. She has been on oral steroids, rescue inhaler, ABx, and cough medications and while they all work for a short duration, she starts experiencing the same symptoms a few days after. She quit smoking in March 2016 and used to smoke a few cigarettes a day. No other history of allergies, no history of lung disease. She is on no chronic medications except iron tablets for anemia.  Past Medical History  Diagnosis Date  . Anemia   . COPD (chronic obstructive pulmonary disease)     Past Surgical History  Procedure Laterality Date  . Cesarean section    . Tubal ligation    . Dilation and curettage of uterus      Family History  Problem Relation Age of Onset  . Hypertension Mother     Social History   Social History  . Marital Status: Married    Spouse Name: N/A  . Number of Children: N/A  . Years of Education: N/A   Occupational History  . Not on file.   Social History Main Topics  . Smoking status: Former Smoker    Types: Cigarettes    Quit date: 06/11/2014  . Smokeless tobacco: Never Used  . Alcohol Use: No  . Drug Use: No  . Sexual Activity: Yes   Other Topics Concern  . Not on file   Social History Narrative     Current outpatient prescriptions:  .  albuterol (PROVENTIL HFA;VENTOLIN HFA) 108 (90 BASE) MCG/ACT inhaler, Inhale 2 puffs into the lungs every 4 (four) hours as needed for wheezing or shortness of breath., Disp: 1 Inhaler, Rfl: 3 .  albuterol (PROVENTIL) (2.5 MG/3ML) 0.083% nebulizer  solution, Take 3 mLs (2.5 mg total) by nebulization every 4 (four) hours as needed for wheezing or shortness of breath. (Patient not taking: Reported on 12/01/2014), Disp: 30 vial, Rfl: 0 .  benzonatate (TESSALON) 100 MG capsule, Take 1 capsule (100 mg total) by mouth 3 (three) times daily., Disp: 30 capsule, Rfl: 0 .  ferrous fumarate (HEMOCYTE - 106 MG FE) 325 (106 FE) MG TABS tablet, Take 1 tablet by mouth daily., Disp: , Rfl:   No Known Allergies   Review of Systems  Constitutional: Negative for fever, chills and weight loss.  Respiratory: Positive for cough, sputum production, shortness of breath and wheezing.   Cardiovascular: Negative for chest pain.     Objective  Filed Vitals:   02/04/15 1404  BP: 118/68  Pulse: 88  Temp: 98.5 F (36.9 C)  Resp: 16  Height: 5\' 7"  (1.702 m)  Weight: 198 lb 1 oz (89.841 kg)  SpO2: 95%    Physical Exam  Constitutional: She is well-developed, well-nourished, and in no distress.  HENT:  Mouth/Throat: No posterior oropharyngeal erythema.  Cardiovascular: Normal rate and regular rhythm.   Pulmonary/Chest: Effort normal and breath sounds normal.  Nursing note and vitals reviewed.   Assessment & Plan  1. COPD exacerbation Frequent episodes of productive coughing in the last 9  months and many episodes resulting in admission to the ED. She will need PFTs for evaluation of severity of COPD. referral to pulmonology placed  - Ambulatory referral to Pulmonology   [facility] 02/04/2015 2:18 PM

## 2015-02-18 ENCOUNTER — Other Ambulatory Visit (INDEPENDENT_AMBULATORY_CARE_PROVIDER_SITE_OTHER): Payer: PRIVATE HEALTH INSURANCE

## 2015-02-18 ENCOUNTER — Ambulatory Visit (INDEPENDENT_AMBULATORY_CARE_PROVIDER_SITE_OTHER): Payer: PRIVATE HEALTH INSURANCE | Admitting: Pulmonary Disease

## 2015-02-18 ENCOUNTER — Encounter: Payer: Self-pay | Admitting: Pulmonary Disease

## 2015-02-18 VITALS — BP 134/76 | HR 67 | Ht 67.0 in | Wt 193.0 lb

## 2015-02-18 DIAGNOSIS — J4521 Mild intermittent asthma with (acute) exacerbation: Secondary | ICD-10-CM | POA: Diagnosis not present

## 2015-02-18 DIAGNOSIS — J4531 Mild persistent asthma with (acute) exacerbation: Secondary | ICD-10-CM | POA: Diagnosis not present

## 2015-02-18 DIAGNOSIS — J3089 Other allergic rhinitis: Secondary | ICD-10-CM

## 2015-02-18 DIAGNOSIS — J45909 Unspecified asthma, uncomplicated: Secondary | ICD-10-CM | POA: Insufficient documentation

## 2015-02-18 DIAGNOSIS — J309 Allergic rhinitis, unspecified: Secondary | ICD-10-CM | POA: Insufficient documentation

## 2015-02-18 LAB — CBC WITH DIFFERENTIAL/PLATELET
BASOS PCT: 0.4 % (ref 0.0–3.0)
Basophils Absolute: 0 10*3/uL (ref 0.0–0.1)
EOS ABS: 0.4 10*3/uL (ref 0.0–0.7)
Eosinophils Relative: 10.5 % — ABNORMAL HIGH (ref 0.0–5.0)
HEMATOCRIT: 42.4 % (ref 36.0–46.0)
HEMOGLOBIN: 14.1 g/dL (ref 12.0–15.0)
LYMPHS PCT: 23.4 % (ref 12.0–46.0)
Lymphs Abs: 0.8 10*3/uL (ref 0.7–4.0)
MCHC: 33.1 g/dL (ref 30.0–36.0)
MCV: 88.2 fl (ref 78.0–100.0)
MONOS PCT: 7.5 % (ref 3.0–12.0)
Monocytes Absolute: 0.3 10*3/uL (ref 0.1–1.0)
Neutro Abs: 2 10*3/uL (ref 1.4–7.7)
Neutrophils Relative %: 58.2 % (ref 43.0–77.0)
Platelets: 183 10*3/uL (ref 150.0–400.0)
RBC: 4.81 Mil/uL (ref 3.87–5.11)
RDW: 14 % (ref 11.5–15.5)
WBC: 3.4 10*3/uL — AB (ref 4.0–10.5)

## 2015-02-18 MED ORDER — AEROCHAMBER MV MISC: Status: AC

## 2015-02-18 NOTE — Patient Instructions (Signed)
Take Symbicort with a spacer 2 puffs twice a day Take generic Zyrtec (cetirizine) 1 pill daily Use saline rinses twice a day, I like Neil med rinses and recommend that she use it twice a day Use Nasacort 2 puffs each nostril once daily, I recommend that you use this no sooner than 30 minutes after using the [place] med rinses We will call you with the results of today's blood work We will see you back in 2-3 weeks or sooner if needed

## 2015-02-18 NOTE — Progress Notes (Signed)
Subjective:    Patient ID: Kara West, female    DOB: 08/02/1975, 39 y.o.   MRN: [id number]  HPI Chief Complaint  Patient presents with  . Advice Only    Referred by Dr. Manuella West.  pt states she's had bronchitis since 02/2014.  Pt has been to ED twice in the past 6 mos for this.    This is a pleasant 39 year old female who comes to my clinic today for evaluation of ongoing chest tightness, cough, and wheezing for the last year. She said that she had no childhood respiratory illnesses and did not start smoking until age 56. She said she smoked less than one pack of cigarettes per week for approximately 3 years. She quit in December 2015 when she started having more chest tightness, wheezing, and shortness of breath. She says that she's been to the emergency department many times during the last year for treatment of the same. She has frequently been told that she has bronchitis and then will be treated with prednisone. She says that the prednisone will cause dramatic improvement in her shortness of breath and other symptoms for a few days but then by the time she stopped taking the medications her symptoms returned. She has been using albuterol which helps some. She has never tried anything other than albuterol in terms of inhalers. She has not touched a cigarette since December 2015. She also notes some fatigue. Hot weather tends to make her shortness of breath worse. 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 She says that this tends to make her shortness of breath and cough worse. She lives in a house that has a crawl space but no basement. It has been inspected recently for mold. There is none seen. She has no animals. Her boyfriend lives in the house and smokes in the house.  No family history of lung disease.    Past Medical History  Diagnosis Date  . Anemia   . COPD (chronic obstructive pulmonary disease)      Family  History  Problem Relation Age of Onset  . Hypertension Mother      Social History   Social History  . Marital Status: Married    Spouse Name: N/A  . Number of Children: N/A  . Years of Education: N/A   Occupational History  . Not on file.   Social History Main Topics  . Smoking status: Former Smoker -- 0.20 packs/day for 5 years    Types: Cigarettes    Quit date: 06/11/2014  . Smokeless tobacco: Never Used  . Alcohol Use: No  . Drug Use: No  . Sexual Activity: Yes   Other Topics Concern  . Not on file   Social History Narrative     No Known Allergies   Outpatient Prescriptions Prior to Visit  Medication Sig Dispense Refill  . albuterol (PROVENTIL HFA;VENTOLIN HFA) 108 (90 BASE) MCG/ACT inhaler Inhale 2 puffs into the lungs every 4 (four) hours as needed for wheezing or shortness of breath. 1 Inhaler 3  . benzonatate (TESSALON) 100 MG capsule Take 1 capsule (100 mg total) by mouth 3 (three) times daily. 30 capsule 0  . albuterol (PROVENTIL) (2.5 MG/3ML) 0.083% nebulizer solution Take 3 mLs (2.5 mg total) by nebulization every 4 (four) hours as needed for wheezing or shortness of breath. (Patient not taking: Reported on 12/01/2014) 30 vial 0  . ferrous fumarate (HEMOCYTE - 106  MG FE) 325 (106 FE) MG TABS tablet Take 1 tablet by mouth daily.     No facility-administered medications prior to visit.       Review of Systems  Constitutional: Negative for fever and unexpected weight change.  HENT: Positive for congestion, rhinorrhea and sinus pressure. Negative for dental problem, ear pain, nosebleeds, postnasal drip, sneezing, sore throat and trouble swallowing.   Eyes: Negative for redness and itching.  Respiratory: Positive for cough, choking, chest tightness, shortness of breath and wheezing.   Cardiovascular: Negative for palpitations and leg swelling.  Gastrointestinal: Negative for nausea and vomiting.  Genitourinary: Negative for dysuria.  Musculoskeletal:  Negative for joint swelling.  Skin: Negative for rash.  Neurological: Negative for headaches.  Hematological: Does not bruise/bleed easily.  Psychiatric/Behavioral: Negative for dysphoric mood. The patient is not nervous/anxious.        Objective:   Physical Exam Filed Vitals:   02/18/15 1119  BP: 134/76  Pulse: 67  Height: 5\' 7"  (1.702 m)  Weight: 193 lb (87.544 kg)  SpO2: 96%  RA  Gen: mildly ill appearing, cough, no acute distress HENT: NCAT, OP clear, neck supple without masses Eyes: PERRL, EOMi Lymph: no cervical lymphadenopathy PULM: CTA B CV: RRR, no mgr, no JVD GI: BS+, soft, nontender, no hsm Derm: no rash or skin breakdown MSK: normal bulk and tone Neuro: A&Ox4, CN II-XII intact, strength 5/5 in all 4 extremities Psyche: normal mood and affect   Primary care physician's note from this year was reviewed where she was evaluated for COPD exacerbations and sent to me for evaluation of the same. Simple spirometry performed today in clinic does not show airflow obstruction, it was not an adequate study in that her effort was poor,     Assessment & Plan:  Moderate Persistent Asthma Kara West describes several months of chest tightness, shortness of breath, wheezing, and cough. Typically the symptoms will improve dramatically with prednisone but then quickly returned. Clinically, this is consistent with a diagnosis of asthma but I think it's also possible that significant sinus drainage and postnasal drip leading to vocal cord irritation which are contributing to her symptoms. It's not entirely clear to me if she has asthma or not as the symptoms are somewhat nonspecific. I would like to get some objective evidence of airflow obstruction. However, assuming she has asthma her symptoms at this point are at least moderate persistent and somewhat worrisome considering the recurrent exacerbations she has been experiencing this year. There have been no major changes in her environment  this year to suggest a clear allergic cause. She quit smoking about 9 months ago and she only smoked a few cigarettes a day for 3 years and so I do not believe she has COPD. The images from her recent CT scan of the chest show bronchial wall thickening which again are somewhat nonspecific but likely consistent with asthma.  Plan: Start Symbicort with a spacer 2 puffs twice a day Simple spirometry today Ambulatory oximetry today Treat allergic rhinitis as detailed below Follow-up 2-3 weeks  Allergic rhinitis This is contributing significantly to her symptoms. She is currently not taking any medication for it.  Plan: Nasal steroid, I recommended Nasacort Generic Zyrtec Saline sprays Check CBC with differential and serum IgE to look for evidence of allergy Follow-up 2-3 weeks     Current outpatient prescriptions:  .  albuterol (PROVENTIL HFA;VENTOLIN HFA) 108 (90 BASE) MCG/ACT inhaler, Inhale 2 puffs into the lungs every 4 (four) hours as needed for  wheezing or shortness of breath., Disp: 1 Inhaler, Rfl: 3 .  albuterol (PROVENTIL) (2.5 MG/3ML) 0.083% nebulizer solution, Take 3 mLs (2.5 mg total) by nebulization every 4 (four) hours as needed for wheezing or shortness of breath. (Patient not taking: Reported on 12/01/2014), Disp: 30 vial, Rfl: 0 .  Spacer/Aero-Holding Chambers (AEROCHAMBER MV) inhaler, Use as instructed, Disp: 1 each, Rfl: 0

## 2015-02-18 NOTE — Assessment & Plan Note (Signed)
This is contributing significantly to her symptoms. She is currently not taking any medication for it.  Plan: Nasal steroid, I recommended Nasacort Generic Zyrtec Saline sprays Check CBC with differential and serum IgE to look for evidence of allergy Follow-up 2-3 weeks

## 2015-02-18 NOTE — Assessment & Plan Note (Signed)
Kara West describes several months of chest tightness, shortness of breath, wheezing, and cough. Typically the symptoms will improve dramatically with prednisone but then quickly returned. Clinically, this is consistent with a diagnosis of asthma but I think it's also possible that significant sinus drainage and postnasal drip leading to vocal cord irritation which are contributing to her symptoms. It's not entirely clear to me if she has asthma or not as the symptoms are somewhat nonspecific. I would like to get some objective evidence of airflow obstruction. However, assuming she has asthma her symptoms at this point are at least moderate persistent and somewhat worrisome considering the recurrent exacerbations she has been experiencing this year. There have been no major changes in her environment this year to suggest a clear allergic cause. She quit smoking about 9 months ago and she only smoked a few cigarettes a day for 3 years and so I do not believe she has COPD. The images from her recent CT scan of the chest show bronchial wall thickening which again are somewhat nonspecific but likely consistent with asthma.  Plan: Start Symbicort with a spacer 2 puffs twice a day Simple spirometry today Ambulatory oximetry today Treat allergic rhinitis as detailed below Follow-up 2-3 weeks

## 2015-02-19 LAB — IGE: IgE (Immunoglobulin E), Serum: 129 kU/L — ABNORMAL HIGH (ref <115)

## 2015-02-24 ENCOUNTER — Telehealth: Payer: Self-pay | Admitting: Pulmonary Disease

## 2015-02-24 DIAGNOSIS — J3089 Other allergic rhinitis: Secondary | ICD-10-CM

## 2015-02-24 NOTE — Telephone Encounter (Signed)
Spoke with pt, aware of results and recs.  RAST ordered.  Nothing further needed.

## 2015-02-24 NOTE — Telephone Encounter (Signed)
Pt returned call Please leave a message on VM. Pt will not be available to talk on phone.  [phone]

## 2015-02-24 NOTE — Telephone Encounter (Signed)
Notes Recorded by Juanito Doom, MD on 02/19/2015 at 6:33 PM A, Please let her know that this shows that she is having an allergic reaction to something. She needs to have RAST (Full profile) before the next visit. Thanks B -----------------------  lmtcb X1 for pt.  Order labs after speaking to pt.

## 2015-02-24 NOTE — Telephone Encounter (Signed)
Pt will call back at 12:00

## 2015-03-02 ENCOUNTER — Other Ambulatory Visit: Payer: PRIVATE HEALTH INSURANCE

## 2015-03-02 DIAGNOSIS — J3089 Other allergic rhinitis: Secondary | ICD-10-CM

## 2015-03-03 LAB — ALLERGY FULL PROFILE
Allergen, D pternoyssinus,d7: 0.28 kU/L — ABNORMAL HIGH
Allergen,Goose feathers, e70: <0.1 kU/L
Box Elder IgE: <0.1 kU/L
Candida Albicans: <0.1 kU/L
Cat Dander: <0.1 kU/L
Common Ragweed: <0.1 kU/L
Curvularia lunata: <0.1 kU/L
D. farinae: 0.22 kU/L — ABNORMAL HIGH
G009 Red Top: <0.1 kU/L
Goldenrod: <0.1 kU/L
Helminthosporium halodes: <0.1 kU/L
IgE (Immunoglobulin E), Serum: 226 kU/L — ABNORMAL HIGH (ref <115)
Lamb's Quarters: <0.1 kU/L
Stemphylium Botryosum: <0.1 kU/L
Timothy Grass: <0.1 kU/L

## 2015-03-06 ENCOUNTER — Ambulatory Visit: Payer: PRIVATE HEALTH INSURANCE | Admitting: Family Medicine

## 2015-03-11 ENCOUNTER — Ambulatory Visit (INDEPENDENT_AMBULATORY_CARE_PROVIDER_SITE_OTHER): Payer: PRIVATE HEALTH INSURANCE | Admitting: Pulmonary Disease

## 2015-03-11 ENCOUNTER — Encounter: Payer: Self-pay | Admitting: Pulmonary Disease

## 2015-03-11 VITALS — BP 108/66 | HR 51 | Ht 69.0 in | Wt 203.0 lb

## 2015-03-11 DIAGNOSIS — J4521 Mild intermittent asthma with (acute) exacerbation: Secondary | ICD-10-CM | POA: Diagnosis not present

## 2015-03-11 DIAGNOSIS — Z23 Encounter for immunization: Secondary | ICD-10-CM | POA: Diagnosis not present

## 2015-03-11 MED ORDER — ALBUTEROL SULFATE (2.5 MG/3ML) 0.083% IN NEBU: 2.5000 mg | INHALATION_SOLUTION | RESPIRATORY_TRACT | Status: DC | PRN

## 2015-03-11 MED ORDER — ALBUTEROL SULFATE 108 (90 BASE) MCG/ACT IN AEPB: 1.0000 | INHALATION_SPRAY | Freq: Four times a day (QID) | RESPIRATORY_TRACT | Status: DC | PRN

## 2015-03-11 NOTE — Progress Notes (Signed)
   Subjective:    Patient ID: Kara West, female    DOB: 22-May-1976, 39 y.o.   MRN: [id number]  Synopsis: Referred in 2016 for evaluation of chest tightness and cough with some shortness of breath. Simple spirometry performed in the office in 2016 did not show evidence of airflow obstruction. Serum IgE was elevated and serum rest testing showed low level sensitivity to dust mites.  HPI Chief Complaint  Patient presents with  . Follow-up    Pt c/o occasional dry cough, SOB and wheezing with activity. Denies any chest tightness/congestion and sinus pressure/drainage. She states symbicort has helped improve symptoms.    She is doing better with the symicort, no trips to the ER. Breathing is much better, minimal cough. She and her boyfriend are moving out of her apartment. She notes a lot of dust in her work place.  She is covered in it there. She has not used the rescue inhaler since the Symbicort.  Past Medical History  Diagnosis Date  . Anemia   . COPD (chronic obstructive pulmonary disease)       Review of Systems     Objective:   Physical Exam Filed Vitals:   03/11/15 1514  BP: 108/66  Pulse: 51  Height: 5\' 9"  (1.753 m)  Weight: 203 lb (92.08 kg)  SpO2: 97%   Gen: well appearing HENT: OP clear, neck supple PULM: CTA B, normal percussion CV: RRR, no mgr, trace edema GI: BS+, soft, nontender Derm: no cyanosis or rash Psyche: normal mood and affect        Assessment & Plan:  Moderate Persistent Asthma She has a dust mite allergy. She did not have airflow obstruction on the last simple spirometry test we performed but she has had many admissions or visits to the hospital for shortness of breath and wheezing which of been responsive to prednisone. Since adding Symbicort she has no longer had to go back to the emergency room and she is not using albuterol. Clinically she has a diagnosis of asthma this time but I would still like to try to get some degree of  objective evidence for airflow obstruction.  Moving forward our plan will be Primary function test in 6 months Continue Symbicort for now, plan to decrease dose on next visit Flu shot today Exchange mattress or get a mattress cover Try to get rid of all wall carpet and bedroom if possible     Current outpatient prescriptions:  .  albuterol (PROVENTIL) (2.5 MG/3ML) 0.083% nebulizer solution, Take 3 mLs (2.5 mg total) by nebulization every 4 (four) hours as needed for wheezing or shortness of breath., Disp: 30 vial, Rfl: 0 .  budesonide-formoterol (SYMBICORT) 160-4.5 MCG/ACT inhaler, Inhale 2 puffs into the lungs 2 (two) times daily., Disp: , Rfl:  .  ibuprofen (ADVIL,MOTRIN) 800 MG tablet, Take 800 mg by mouth every 8 (eight) hours as needed., Disp: , Rfl:  .  Spacer/Aero-Holding Chambers (AEROCHAMBER MV) inhaler, Use as instructed, Disp: 1 each, Rfl: 0 .  Albuterol Sulfate (PROAIR RESPICLICK) 381 (90 BASE) MCG/ACT AEPB, Inhale 1 puff into the lungs every 6 (six) hours as needed (wheezing)., Disp: 1 each, Rfl: 0

## 2015-03-11 NOTE — Assessment & Plan Note (Signed)
She has a dust mite allergy. She did not have airflow obstruction on the last simple spirometry test we performed but she has had many admissions or visits to the hospital for shortness of breath and wheezing which of been responsive to prednisone. Since adding Symbicort she has no longer had to go back to the emergency room and she is not using albuterol. Clinically she has a diagnosis of asthma this time but I would still like to try to get some degree of objective evidence for airflow obstruction.  Moving forward our plan will be Primary function test in 6 months Continue Symbicort for now, plan to decrease dose on next visit Flu shot today Exchange mattress or get a mattress cover Try to get rid of all wall carpet and bedroom if possible

## 2015-03-11 NOTE — Patient Instructions (Signed)
I recommend that you get a new mattress or buy a hypoallergenic mattress cover Take the Symbicort 2 puffs twice a day Use albuterol as needed for shortness of breath We will see you back in 6 months or sooner if needed

## 2015-03-20 ENCOUNTER — Ambulatory Visit: Payer: PRIVATE HEALTH INSURANCE | Admitting: Family Medicine

## 2015-05-31 ENCOUNTER — Telehealth: Payer: Self-pay | Admitting: Internal Medicine

## 2015-05-31 ENCOUNTER — Emergency Department (HOSPITAL_COMMUNITY)
Admission: EM | Admit: 2015-05-31 | Discharge: 2015-05-31 | Disposition: A | Discharge status: Home / Self Care | Payer: No Typology Code available for payment source | Attending: Emergency Medicine | Admitting: Emergency Medicine

## 2015-05-31 ENCOUNTER — Encounter (HOSPITAL_COMMUNITY): Payer: Self-pay | Admitting: *Deleted

## 2015-05-31 DIAGNOSIS — Z87891 Personal history of nicotine dependence: Secondary | ICD-10-CM | POA: Diagnosis not present

## 2015-05-31 DIAGNOSIS — Z862 Personal history of diseases of the blood and blood-forming organs and certain disorders involving the immune mechanism: Secondary | ICD-10-CM | POA: Diagnosis not present

## 2015-05-31 DIAGNOSIS — Z79899 Other long term (current) drug therapy: Secondary | ICD-10-CM | POA: Diagnosis not present

## 2015-05-31 DIAGNOSIS — J441 Chronic obstructive pulmonary disease with (acute) exacerbation: Secondary | ICD-10-CM | POA: Diagnosis not present

## 2015-05-31 DIAGNOSIS — R0602 Shortness of breath: Secondary | ICD-10-CM | POA: Diagnosis present

## 2015-05-31 DIAGNOSIS — Z7951 Long term (current) use of inhaled steroids: Secondary | ICD-10-CM | POA: Diagnosis not present

## 2015-05-31 HISTORY — DX: Bronchitis, not specified as acute or chronic: J40

## 2015-05-31 MED ORDER — PREDNISONE 20 MG PO TABS
60.0000 mg | ORAL_TABLET | Freq: Once | ORAL | Status: AC
  Administered 2015-05-31: 60 mg via ORAL
  Filled 2015-05-31: qty 3

## 2015-05-31 MED ORDER — HYDROCODONE-HOMATROPINE 5-1.5 MG/5ML PO SYRP: 5.0000 mL | ORAL_SOLUTION | Freq: Four times a day (QID) | ORAL | Status: DC | PRN

## 2015-05-31 MED ORDER — AZITHROMYCIN 250 MG PO TABS: 250.0000 mg | ORAL_TABLET | Freq: Every day | ORAL | Status: DC

## 2015-05-31 MED ORDER — PREDNISONE 20 MG PO TABS: 60.0000 mg | ORAL_TABLET | Freq: Every day | ORAL | Status: DC

## 2015-05-31 NOTE — ED Notes (Signed)
Pt states that she has a cough, congestion, runny nose and feels short of breath for 2 days;  Pt states "I was diagnosed with Bronchitis last year and it just really bad right now"; pt states that she has a dry cough at times and a productive cough of yellow phlegm at times

## 2015-05-31 NOTE — ED Provider Notes (Signed)
CSN: [id number]     Arrival date & time 05/31/15  0534 History   First MD Initiated Contact with Patient 05/31/15 0544     Chief Complaint  Patient presents with  . URI     (Consider location/radiation/quality/duration/timing/severity/associated sxs/prior Treatment) HPI Comments: Patient presents to the emergency department for evaluation of nasal congestion, cough, chest congestion and difficulty breathing. Patient does have a history of COPD and bronchitis. She has been using her inhaler without much relief of her symptoms. She reports that she had a similar episode last year around this time. She has been following up with pulmonology, but she has been unable to get into that because it is the weekend.  Patient is a 39 y.o. female presenting with URI.  URI Presenting symptoms: congestion and cough   Associated symptoms: wheezing     Past Medical History  Diagnosis Date  . Anemia   . COPD (chronic obstructive pulmonary disease) ([place])   . Bronchitis    Past Surgical History  Procedure Laterality Date  . Cesarean section    . Tubal ligation    . Dilation and curettage of uterus     Family History  Problem Relation Age of Onset  . Hypertension Mother    Social History  Substance Use Topics  . Smoking status: Former Smoker -- 0.20 packs/day for 5 years    Types: Cigarettes    Quit date: 06/11/2014  . Smokeless tobacco: Never Used  . Alcohol Use: No   OB History    No data available     Review of Systems  HENT: Positive for congestion.   Respiratory: Positive for cough, shortness of breath and wheezing.   All other systems reviewed and are negative.     Allergies  Review of patient's allergies indicates no known allergies.  Home Medications   Prior to Admission medications   Medication Sig Start Date End Date Taking? Authorizing Provider  albuterol (PROVENTIL) (2.5 MG/3ML) 0.083% nebulizer solution Take 3 mLs (2.5 mg total) by nebulization every 4 (four)  hours as needed for wheezing or shortness of breath. 03/11/15   Juanito Doom, MD  Albuterol Sulfate (PROAIR RESPICLICK) 123XX123 (90 BASE) MCG/ACT AEPB Inhale 1 puff into the lungs every 6 (six) hours as needed (wheezing). 03/11/15   Juanito Doom, MD  budesonide-formoterol [facility]) 160-4.5 MCG/ACT inhaler Inhale 2 puffs into the lungs 2 (two) times daily.    Historical Provider, MD  ibuprofen (ADVIL,MOTRIN) 800 MG tablet Take 800 mg by mouth every 8 (eight) hours as needed. 02/16/15   Historical Provider, MD  Spacer/Aero-Holding Chambers (AEROCHAMBER MV) inhaler Use as instructed 02/18/15   Juanito Doom, MD   BP 115/73 mmHg  Pulse 98  Temp(Src) 97.4 F (36.3 C) (Oral)  Resp 20  SpO2 97%  LMP 05/30/2015 Physical Exam  Constitutional: She is oriented to person, place, and time. She appears well-developed and well-nourished. No distress.  HENT:  Head: Normocephalic and atraumatic.  Right Ear: Hearing normal.  Left Ear: Hearing normal.  Nose: Nose normal.  Mouth/Throat: Oropharynx is clear and moist and mucous membranes are normal.  Eyes: Conjunctivae and EOM are normal. Pupils are equal, round, and reactive to light.  Neck: Normal range of motion. Neck supple.  Cardiovascular: Regular rhythm, S1 normal and S2 normal.  Exam reveals no gallop and no friction rub.   No murmur heard. Pulmonary/Chest: Effort normal and breath sounds normal. No respiratory distress. She exhibits no tenderness.  Abdominal: Soft. Normal appearance and  bowel sounds are normal. There is no hepatosplenomegaly. There is no tenderness. There is no rebound, no guarding, no tenderness at McBurney's point and negative Murphy's sign. No hernia.  Musculoskeletal: Normal range of motion.  Neurological: She is alert and oriented to person, place, and time. She has normal strength. No cranial nerve deficit or sensory deficit. Coordination normal. GCS eye subscore is 4. GCS verbal subscore is 5. GCS motor subscore is 6.   Skin: Skin is warm, dry and intact. No rash noted. No cyanosis.  Psychiatric: She has a normal mood and affect. Her speech is normal and behavior is normal. Thought content normal.  Nursing note and vitals reviewed.   ED Course  Procedures (including critical care time) Labs Review Labs Reviewed - No data to display  Imaging Review No results found. I have personally reviewed and evaluated these images and lab results as part of my medical decision-making.   EKG Interpretation None      MDM   Final diagnoses:  None   COPD bronchitis  Patient with history of COPD and bronchitis presents to the ER for evaluation of productive cough, wheezing, shortness of breath. Oxygenation is 97% here in the ER. She does not have any significant bronchospasm, air movement is normal. She will continue using her albuterol, will add Zithromax and prednisone for COPD exacerbation with bronchitis.    Orpah Greek, MD 05/31/15 [phone]

## 2015-05-31 NOTE — Discharge Instructions (Signed)
Chronic Obstructive Pulmonary Disease Chronic obstructive pulmonary disease (COPD) is a common lung condition in which airflow from the lungs is limited. COPD is a general term that can be used to describe many different lung problems that limit airflow, including both chronic bronchitis and emphysema. If you have COPD, your lung function will probably never return to normal, but there are measures you can take to improve lung function and make yourself feel better. CAUSES   Smoking (common).  Exposure to secondhand smoke.  Genetic problems.  Chronic inflammatory lung diseases or recurrent infections. SYMPTOMS  Shortness of breath, especially with physical activity.  Deep, persistent (chronic) cough with a large amount of thick mucus.  Wheezing.  Rapid breaths (tachypnea).  Gray or bluish discoloration (cyanosis) of the skin, especially in your fingers, toes, or lips.  Fatigue.  Weight loss.  Frequent infections or episodes when breathing symptoms become much worse (exacerbations).  Chest tightness. DIAGNOSIS Your health care provider will take a medical history and perform a physical examination to diagnose COPD. Additional tests for COPD may include:  Lung (pulmonary) function tests.  Chest X-ray.  CT scan.  Blood tests. TREATMENT  Treatment for COPD may include:  Inhaler and nebulizer medicines. These help manage the symptoms of COPD and make your breathing more comfortable.  Supplemental oxygen. Supplemental oxygen is only helpful if you have a low oxygen level in your blood.  Exercise and physical activity. These are beneficial for nearly all people with COPD.  Lung surgery or transplant.  Nutrition therapy to gain weight, if you are underweight.  Pulmonary rehabilitation. This may involve working with a team of health care providers and specialists, such as respiratory, occupational, and physical therapists. HOME CARE INSTRUCTIONS  Take all medicines  (inhaled or pills) as directed by your health care provider.  Avoid over-the-counter medicines or cough syrups that dry up your airway (such as antihistamines) and slow down the elimination of secretions unless instructed otherwise by your health care provider.  If you are a smoker, the most important thing that you can do is stop smoking. Continuing to smoke will cause further lung damage and breathing trouble. Ask your health care provider for help with quitting smoking. He or she can direct you to community resources or hospitals that provide support.  Avoid exposure to irritants such as smoke, chemicals, and fumes that aggravate your breathing.  Use oxygen therapy and pulmonary rehabilitation if directed by your health care provider. If you require home oxygen therapy, ask your health care provider whether you should purchase a pulse oximeter to measure your oxygen level at home.  Avoid contact with individuals who have a contagious illness.  Avoid extreme temperature and humidity changes.  Eat healthy foods. Eating smaller, more frequent meals and resting before meals may help you maintain your strength.  Stay active, but balance activity with periods of rest. Exercise and physical activity will help you maintain your ability to do things you want to do.  Preventing infection and hospitalization is very important when you have COPD. Make sure to receive all the vaccines your health care provider recommends, especially the pneumococcal and influenza vaccines. Ask your health care provider whether you need a pneumonia vaccine.  Learn and use relaxation techniques to manage stress.  Learn and use controlled breathing techniques as directed by your health care provider. Controlled breathing techniques include:  Pursed lip breathing. Start by breathing in (inhaling) through your nose for 1 second. Then, purse your lips as if you were  going to whistle and breathe out (exhale) through the  pursed lips for 2 seconds.  Diaphragmatic breathing. Start by putting one hand on your abdomen just above your waist. Inhale slowly through your nose. The hand on your abdomen should move out. Then purse your lips and exhale slowly. You should be able to feel the hand on your abdomen moving in as you exhale.  Learn and use controlled coughing to clear mucus from your lungs. Controlled coughing is a series of short, progressive coughs. The steps of controlled coughing are: 1. Lean your head slightly forward. 2. Breathe in deeply using diaphragmatic breathing. 3. Try to hold your breath for 3 seconds. 4. Keep your mouth slightly open while coughing twice. 5. Spit any mucus out into a tissue. 6. Rest and repeat the steps once or twice as needed. SEEK MEDICAL CARE IF:  You are coughing up more mucus than usual.  There is a change in the color or thickness of your mucus.  Your breathing is more labored than usual.  Your breathing is faster than usual. SEEK IMMEDIATE MEDICAL CARE IF:  You have shortness of breath while you are resting.  You have shortness of breath that prevents you from:  Being able to talk.  Performing your usual physical activities.  You have chest pain lasting longer than 5 minutes.  Your skin color is more cyanotic than usual.  You measure low oxygen saturations for longer than 5 minutes with a pulse oximeter. MAKE SURE YOU:  Understand these instructions.  Will watch your condition.  Will get help right away if you are not doing well or get worse.   This information is not intended to replace advice given to you by your health care provider. Make sure you discuss any questions you have with your health care provider.   Document Released: 03/16/2005 Document Revised: 06/27/2014 Document Reviewed: 01/31/2013 Elsevier Interactive Patient Education 2016 Elsevier [employer] Acute Bronchitis Bronchitis is inflammation of the airways that extend from the windpipe  into the lungs (bronchi). The inflammation often causes mucus to develop. This leads to a cough, which is the most common symptom of bronchitis.  In acute bronchitis, the condition usually develops suddenly and goes away over time, usually in a couple weeks. Smoking, allergies, and asthma can make bronchitis worse. Repeated episodes of bronchitis may cause further lung problems.  CAUSES Acute bronchitis is most often caused by the same virus that causes a cold. The virus can spread from person to person (contagious) through coughing, sneezing, and touching contaminated objects. SIGNS AND SYMPTOMS   Cough.   Fever.   Coughing up mucus.   Body aches.   Chest congestion.   Chills.   Shortness of breath.   Sore throat.  DIAGNOSIS  Acute bronchitis is usually diagnosed through a physical exam. Your health care provider will also ask you questions about your medical history. Tests, such as chest X-rays, are sometimes done to rule out other conditions.  TREATMENT  Acute bronchitis usually goes away in a couple weeks. Oftentimes, no medical treatment is necessary. Medicines are sometimes given for relief of fever or cough. Antibiotic medicines are usually not needed but may be prescribed in certain situations. In some cases, an inhaler may be recommended to help reduce shortness of breath and control the cough. A cool mist vaporizer may also be used to help thin bronchial secretions and make it easier to clear the chest.  HOME CARE INSTRUCTIONS  Get plenty of rest.   Drink  enough fluids to keep your urine clear or pale yellow (unless you have a medical condition that requires fluid restriction). Increasing fluids may help thin your respiratory secretions (sputum) and reduce chest congestion, and it will prevent dehydration.   Take medicines only as directed by your health care provider.  If you were prescribed an antibiotic medicine, finish it all even if you start to feel  better.  Avoid smoking and secondhand smoke. Exposure to cigarette smoke or irritating chemicals will make bronchitis worse. If you are a smoker, consider using nicotine gum or skin patches to help control withdrawal symptoms. Quitting smoking will help your lungs heal faster.   Reduce the chances of another bout of acute bronchitis by washing your hands frequently, avoiding people with cold symptoms, and trying not to touch your hands to your mouth, nose, or eyes.   Keep all follow-up visits as directed by your health care provider.  SEEK MEDICAL CARE IF: Your symptoms do not improve after 1 week of treatment.  SEEK IMMEDIATE MEDICAL CARE IF:  You develop an increased fever or chills.   You have chest pain.   You have severe shortness of breath.  You have bloody sputum.   You develop dehydration.  You faint or repeatedly feel like you are going to pass out.  You develop repeated vomiting.  You develop a severe headache. MAKE SURE YOU:   Understand these instructions.  Will watch your condition.  Will get help right away if you are not doing well or get worse.   This information is not intended to replace advice given to you by your health care provider. Make sure you discuss any questions you have with your health care provider.   Document Released: 07/14/2004 Document Revised: 06/27/2014 Document Reviewed: 11/27/2012 Elsevier Interactive Patient Education [employer].

## 2015-06-16 ENCOUNTER — Other Ambulatory Visit: Payer: Self-pay | Admitting: *Deleted

## 2015-06-16 ENCOUNTER — Encounter (HOSPITAL_COMMUNITY): Payer: Self-pay | Admitting: Emergency Medicine

## 2015-06-16 ENCOUNTER — Emergency Department (HOSPITAL_COMMUNITY): Payer: No Typology Code available for payment source

## 2015-06-16 ENCOUNTER — Emergency Department (HOSPITAL_COMMUNITY)
Admission: EM | Admit: 2015-06-16 | Discharge: 2015-06-17 | Disposition: A | Discharge status: Home / Self Care | Payer: No Typology Code available for payment source | Attending: Emergency Medicine | Admitting: Emergency Medicine

## 2015-06-16 DIAGNOSIS — J441 Chronic obstructive pulmonary disease with (acute) exacerbation: Secondary | ICD-10-CM | POA: Diagnosis not present

## 2015-06-16 DIAGNOSIS — Z79899 Other long term (current) drug therapy: Secondary | ICD-10-CM | POA: Insufficient documentation

## 2015-06-16 DIAGNOSIS — Z862 Personal history of diseases of the blood and blood-forming organs and certain disorders involving the immune mechanism: Secondary | ICD-10-CM | POA: Diagnosis not present

## 2015-06-16 DIAGNOSIS — Z7951 Long term (current) use of inhaled steroids: Secondary | ICD-10-CM | POA: Diagnosis not present

## 2015-06-16 DIAGNOSIS — Z87891 Personal history of nicotine dependence: Secondary | ICD-10-CM | POA: Diagnosis not present

## 2015-06-16 DIAGNOSIS — R0602 Shortness of breath: Secondary | ICD-10-CM | POA: Diagnosis present

## 2015-06-16 MED ORDER — PREDNISONE 20 MG PO TABS
60.0000 mg | ORAL_TABLET | Freq: Once | ORAL | Status: AC
  Administered 2015-06-17: 60 mg via ORAL
  Filled 2015-06-16: qty 3

## 2015-06-16 MED ORDER — ALBUTEROL SULFATE (2.5 MG/3ML) 0.083% IN NEBU
5.0000 mg | INHALATION_SOLUTION | Freq: Once | RESPIRATORY_TRACT | Status: AC
  Administered 2015-06-16: 5 mg via RESPIRATORY_TRACT
  Filled 2015-06-16: qty 6

## 2015-06-16 MED ORDER — IPRATROPIUM-ALBUTEROL 0.5-2.5 (3) MG/3ML IN SOLN
3.0000 mL | Freq: Once | RESPIRATORY_TRACT | Status: AC
  Administered 2015-06-17: 3 mL via RESPIRATORY_TRACT
  Filled 2015-06-16: qty 3

## 2015-06-16 MED ORDER — ALBUTEROL SULFATE 108 (90 BASE) MCG/ACT IN AEPB: 1.0000 | INHALATION_SPRAY | Freq: Four times a day (QID) | RESPIRATORY_TRACT | Status: DC | PRN

## 2015-06-16 NOTE — ED Notes (Signed)
INITIAL ASSESSMENT COMPLETED. PT C/O PRODUCTIVE COUGH AND SOB X4 DAYS. DENIES FEVER. PT STATES SHE HAS A HX OF BRONCHITIS. AWAITING FURTHER ORDERS.

## 2015-06-16 NOTE — Telephone Encounter (Signed)
Received refill request from [place]. For ProAir Respiclick Rx refilled. Nothing further needed.

## 2015-06-16 NOTE — ED Notes (Signed)
Patient presents for SOB starting earlier today, ran out of albuterol treatments at home, recently diagnosed with bronchitis. No relief at home with inhaler. Patient has labored breathing and non productive cough during triage.

## 2015-06-16 NOTE — ED Provider Notes (Signed)
CSN: [id number]     Arrival date & time 06/16/15  2314 History  By signing my name below, I, Irene Pap, attest that this documentation has been prepared under the direction and in the presence of Gareth Morgan, MD. Electronically Signed: Irene Pap, ED Scribe. 06/16/2015. 2:23 AM.    No chief complaint on file.   Patient is a 39 y.o. female presenting with shortness of breath. The history is provided by the patient. No language interpreter was used.  Shortness of Breath Severity:  Moderate Onset quality:  Sudden Duration:  4 days Timing:  Constant Progression:  Worsening Chronicity:  New Ineffective treatments:  Inhaler Associated symptoms: cough, sore throat and wheezing   Associated symptoms: no chest pain, no fever, no headaches and no vomiting   HPI Comments: Kara West is a 39 y.o. Female with a hx of COPD and bronchitis who presents to the Emergency Department complaining of gradually worsening, constant SOB onset 4 days ago. She reports associated productive cough with yellow sputum and wheezing. Pt reports that she has run out of the albuterol for her nebulizer, so she has not been using it. She reports using her albuterol inhaler to no relief. She states that she was seen for the same on 05/31/15 and was given prednisone to relief. She denies fever, chills, nausea, vomiting, diarrhea, chest pain, leg swelling, headaches, or dysuria.   Past Medical History  Diagnosis Date  . Anemia   . COPD (chronic obstructive pulmonary disease) ([place])   . Bronchitis    Past Surgical History  Procedure Laterality Date  . Cesarean section    . Tubal ligation    . Dilation and curettage of uterus     Family History  Problem Relation Age of Onset  . Hypertension Mother    Social History  Substance Use Topics  . Smoking status: Former Smoker -- 0.20 packs/day for 5 years    Types: Cigarettes    Quit date: 06/11/2014  . Smokeless tobacco: Never Used  . Alcohol Use:  No   OB History    No data available     Review of Systems  Constitutional: Negative for fever and chills.  HENT: Positive for congestion and sore throat.   Respiratory: Positive for cough, shortness of breath and wheezing.   Cardiovascular: Negative for chest pain and leg swelling.  Gastrointestinal: Negative for nausea, vomiting and diarrhea.  Genitourinary: Negative for dysuria.  Neurological: Negative for headaches.   Allergies  Review of patient's allergies indicates no known allergies.  Home Medications   Prior to Admission medications   Medication Sig Start Date End Date Taking? Authorizing Provider  Albuterol Sulfate (PROAIR RESPICLICK) 123XX123 (90 Base) MCG/ACT AEPB Inhale 1 puff into the lungs every 6 (six) hours as needed (wheezing). 06/16/15  Yes Juanito Doom, MD  budesonide-formoterol [facility]) 160-4.5 MCG/ACT inhaler Inhale 2 puffs into the lungs 2 (two) times daily.   Yes Historical Provider, MD  ibuprofen (ADVIL,MOTRIN) 800 MG tablet Take 800 mg by mouth every 8 (eight) hours as needed. 02/16/15  Yes Historical Provider, MD  albuterol (PROVENTIL) (2.5 MG/3ML) 0.083% nebulizer solution Take 3 mLs (2.5 mg total) by nebulization every 4 (four) hours as needed for wheezing or shortness of breath. 06/17/15   Gareth Morgan, MD  azithromycin (ZITHROMAX Z-PAK) 250 MG tablet Take 1 tablet (250 mg total) by mouth daily. Take 2 tablets (500mg ) on the first day followed by 1 tablet daily for 4 days 06/17/15 06/21/15  Gareth Morgan, MD  HYDROcodone-homatropine (HYCODAN) 5-1.5 MG/5ML syrup Take 5 mLs by mouth every 6 (six) hours as needed for cough. Patient not taking: Reported on 06/16/2015 05/31/15   Orpah Greek, MD  predniSONE (DELTASONE) 10 MG tablet Take 4 tablets (40 mg total) by mouth daily. 06/17/15 06/20/15  Gareth Morgan, MD  Spacer/Aero-Holding Chambers (AEROCHAMBER MV) inhaler Use as instructed 02/18/15   Juanito Doom, MD   BP 109/78 mmHg  Pulse 87   Temp(Src) 98.1 F (36.7 C) (Oral)  Resp 18  SpO2 98%  LMP 05/30/2015 Physical Exam  Constitutional: She is oriented to person, place, and time. She appears well-developed and well-nourished. No distress.  HENT:  Head: Normocephalic and atraumatic.  Mouth/Throat: No oropharyngeal exudate.  Eyes: Conjunctivae and EOM are normal.  Neck: Normal range of motion.  Cardiovascular: Normal rate, regular rhythm, normal heart sounds and intact distal pulses.  Exam reveals no gallop and no friction rub.   No murmur heard. Equal pulses  Pulmonary/Chest: Effort normal. No respiratory distress. She has wheezes. She has no rales.  Scattered wheezes  Abdominal: Soft. She exhibits no distension. There is no tenderness. There is no guarding.  Musculoskeletal: She exhibits no edema or tenderness.  Neurological: She is alert and oriented to person, place, and time.  Skin: Skin is warm and dry. No rash noted. She is not diaphoretic. No erythema.  Nursing note and vitals reviewed.   ED Course  Procedures (including critical care time) DIAGNOSTIC STUDIES: Oxygen Saturation is 96% on RA, normal by my interpretation.    COORDINATION OF CARE: 12:02 AM-Discussed treatment plan which includes labs and steroidswith pt at bedside and pt agreed to plan.    Labs Review Labs Reviewed  CBC WITH DIFFERENTIAL/PLATELET - Abnormal; Notable for the following:    Platelets 130 (*)    All other components within normal limits  COMPREHENSIVE METABOLIC PANEL - Abnormal; Notable for the following:    CO2 21 (*)    Glucose, Bld 102 (*)    Calcium 8.8 (*)    All other components within normal limits  I-STAT TROPOININ, ED  I-STAT TROPOININ, ED  Randolm Idol, ED    Imaging Review Dg Chest 2 View  06/17/2015  CLINICAL DATA:  Acute onset of shortness of breath and worsening chest pain. Generalized weakness. Initial encounter. EXAM: CHEST  2 VIEW COMPARISON:  Chest radiograph performed 02/01/2015 FINDINGS: The  lungs are well-aerated and clear. There is no evidence of focal opacification, pleural effusion or pneumothorax. There is narrowing of the proximal trachea, of uncertain significance. The heart is normal in size; the mediastinal contour is within normal limits. No acute osseous abnormalities are seen. IMPRESSION: Lungs remain grossly clear. Narrowing of the proximal trachea, of uncertain significance. Electronically Signed   By: Garald Balding M.D.   On: 06/17/2015 00:43   I have personally reviewed and evaluated these images and lab results as part of my medical decision-making.   EKG Interpretation   Date/Time:  Tuesday June 16 2015 23:35:29 EST Ventricular Rate:  100 PR Interval:  130 QRS Duration: 96 QT Interval:  367 QTC Calculation: 473 R Axis:   67 Text Interpretation:  Sinus tachycardia Low voltage, precordial leads  Baseline wander in lead(s) I II III aVR aVL aVF V3 V4 V5 V6 No significant  change since last tracing (other than significant baseline wander)  Confirmed by Girard ([id number]) on 06/17/2015 1:17:22 AM      MDM   Final diagnoses:  COPD exacerbation ([place])  39 year old female with a history of COPD,  who presents with concern of shortness of breath and cough.  Differential diagnosis for dyspnea includes ACS, PE, COPD exacerbation, CHF exacerbation, anemia, pneumonia.  Chest x-ray was done which showed no acute findings. EKG was evaluated by me which showed no acute changes.  Denies CP.  Troponin negative.  History most consistent with viral URI given nasal congestion, cough, sick contacts with COPD exacerbation and diffuse wheezing. Symptoms improved with duonebs. Given rx for prednisone, azithromycin, home albut nebulizer for COPD exacerbation. Stable for discharge and outpt management. Patient discharged in stable condition with understanding of reasons to return.   I personally performed the services described in this documentation, which was scribed in  my presence. The recorded information has been reviewed and is accurate.   Gareth Morgan, MD 06/17/15 2039

## 2015-06-17 ENCOUNTER — Emergency Department (HOSPITAL_COMMUNITY): Payer: No Typology Code available for payment source

## 2015-06-17 LAB — COMPREHENSIVE METABOLIC PANEL
ALT: 19 U/L (ref 14–54)
ANION GAP: 9 (ref 5–15)
AST: 19 U/L (ref 15–41)
Albumin: 4.1 g/dL (ref 3.5–5.0)
Alkaline Phosphatase: 53 U/L (ref 38–126)
BUN: 14 mg/dL (ref 6–20)
CHLORIDE: 111 mmol/L (ref 101–111)
CO2: 21 mmol/L — ABNORMAL LOW (ref 22–32)
Calcium: 8.8 mg/dL — ABNORMAL LOW (ref 8.9–10.3)
Creatinine, Ser: 0.68 mg/dL (ref 0.44–1.00)
Glucose, Bld: 102 mg/dL — ABNORMAL HIGH (ref 65–99)
POTASSIUM: 3.5 mmol/L (ref 3.5–5.1)
Sodium: 141 mmol/L (ref 135–145)
TOTAL PROTEIN: 6.9 g/dL (ref 6.5–8.1)
Total Bilirubin: 0.7 mg/dL (ref 0.3–1.2)

## 2015-06-17 LAB — CBC WITH DIFFERENTIAL/PLATELET
BASOS PCT: 0 %
Basophils Absolute: 0 10*3/uL (ref 0.0–0.1)
Eosinophils Absolute: 0.1 10*3/uL (ref 0.0–0.7)
Eosinophils Relative: 2 %
HCT: 42.2 % (ref 36.0–46.0)
Hemoglobin: 14 g/dL (ref 12.0–15.0)
LYMPHS ABS: 0.8 10*3/uL (ref 0.7–4.0)
LYMPHS PCT: 20 %
MCH: 29 pg (ref 26.0–34.0)
MCHC: 33.2 g/dL (ref 30.0–36.0)
MCV: 87.6 fL (ref 78.0–100.0)
MONO ABS: 0.2 10*3/uL (ref 0.1–1.0)
MONOS PCT: 6 %
NEUTROS ABS: 2.9 10*3/uL (ref 1.7–7.7)
NEUTROS PCT: 72 %
PLATELETS: 130 10*3/uL — AB (ref 150–400)
RBC: 4.82 MIL/uL (ref 3.87–5.11)
RDW: 13.3 % (ref 11.5–15.5)
WBC: 4.1 10*3/uL (ref 4.0–10.5)

## 2015-06-17 LAB — I-STAT TROPONIN, ED
TROPONIN I, POC: 0 ng/mL (ref 0.00–0.08)
TROPONIN I, POC: 0 ng/mL (ref 0.00–0.08)

## 2015-06-17 MED ORDER — AZITHROMYCIN 250 MG PO TABS: 250.0000 mg | ORAL_TABLET | Freq: Every day | ORAL | Status: AC

## 2015-06-17 MED ORDER — PREDNISONE 10 MG PO TABS: 40.0000 mg | ORAL_TABLET | Freq: Every day | ORAL | Status: AC

## 2015-06-17 MED ORDER — ALBUTEROL SULFATE (2.5 MG/3ML) 0.083% IN NEBU: 2.5000 mg | INHALATION_SOLUTION | RESPIRATORY_TRACT | Status: DC | PRN

## 2015-06-17 NOTE — ED Notes (Signed)
THE PT DOES NOT WANT TO HAVE ANOTHER BLOOD DRAW. WILL ADD-ON TO THE BLOOD IN THE LAB.

## 2015-06-26 ENCOUNTER — Ambulatory Visit: Payer: PRIVATE HEALTH INSURANCE | Admitting: Acute Care

## 2015-06-30 ENCOUNTER — Emergency Department (HOSPITAL_COMMUNITY): Payer: No Typology Code available for payment source

## 2015-06-30 ENCOUNTER — Emergency Department (HOSPITAL_COMMUNITY)
Admission: EM | Admit: 2015-06-30 | Discharge: 2015-06-30 | Disposition: A | Discharge status: Home / Self Care | Payer: No Typology Code available for payment source | Attending: Emergency Medicine | Admitting: Emergency Medicine

## 2015-06-30 ENCOUNTER — Encounter (HOSPITAL_COMMUNITY): Payer: Self-pay | Admitting: *Deleted

## 2015-06-30 DIAGNOSIS — R1110 Vomiting, unspecified: Secondary | ICD-10-CM | POA: Insufficient documentation

## 2015-06-30 DIAGNOSIS — R05 Cough: Secondary | ICD-10-CM | POA: Diagnosis present

## 2015-06-30 DIAGNOSIS — Z79899 Other long term (current) drug therapy: Secondary | ICD-10-CM | POA: Insufficient documentation

## 2015-06-30 DIAGNOSIS — Z862 Personal history of diseases of the blood and blood-forming organs and certain disorders involving the immune mechanism: Secondary | ICD-10-CM | POA: Diagnosis not present

## 2015-06-30 DIAGNOSIS — Z87891 Personal history of nicotine dependence: Secondary | ICD-10-CM | POA: Diagnosis not present

## 2015-06-30 DIAGNOSIS — J441 Chronic obstructive pulmonary disease with (acute) exacerbation: Secondary | ICD-10-CM | POA: Diagnosis not present

## 2015-06-30 DIAGNOSIS — R059 Cough, unspecified: Secondary | ICD-10-CM

## 2015-06-30 LAB — BASIC METABOLIC PANEL
Anion gap: 7 (ref 5–15)
BUN: 10 mg/dL (ref 6–20)
CALCIUM: 8.9 mg/dL (ref 8.9–10.3)
CO2: 24 mmol/L (ref 22–32)
CREATININE: 0.48 mg/dL (ref 0.44–1.00)
Chloride: 111 mmol/L (ref 101–111)
GFR calc non Af Amer: >60 mL/min (ref >60)
Glucose, Bld: 101 mg/dL — ABNORMAL HIGH (ref 65–99)
Potassium: 3.9 mmol/L (ref 3.5–5.1)
Sodium: 142 mmol/L (ref 135–145)

## 2015-06-30 LAB — CBC WITH DIFFERENTIAL/PLATELET
BASOS PCT: 0 %
Basophils Absolute: 0 10*3/uL (ref 0.0–0.1)
EOS PCT: 4 %
Eosinophils Absolute: 0.2 10*3/uL (ref 0.0–0.7)
HEMATOCRIT: 39.9 % (ref 36.0–46.0)
Hemoglobin: 13 g/dL (ref 12.0–15.0)
LYMPHS ABS: 1.5 10*3/uL (ref 0.7–4.0)
Lymphocytes Relative: 31 %
MCH: 29.2 pg (ref 26.0–34.0)
MCHC: 32.6 g/dL (ref 30.0–36.0)
MCV: 89.7 fL (ref 78.0–100.0)
MONO ABS: 0.5 10*3/uL (ref 0.1–1.0)
Monocytes Relative: 10 %
Neutro Abs: 2.7 10*3/uL (ref 1.7–7.7)
Neutrophils Relative %: 55 %
PLATELETS: 229 10*3/uL (ref 150–400)
RBC: 4.45 MIL/uL (ref 3.87–5.11)
RDW: 13.4 % (ref 11.5–15.5)
WBC: 4.9 10*3/uL (ref 4.0–10.5)

## 2015-06-30 MED ORDER — HYDROCODONE-HOMATROPINE 5-1.5 MG/5ML PO SYRP: 5.0000 mL | ORAL_SOLUTION | Freq: Four times a day (QID) | ORAL | Status: DC | PRN

## 2015-06-30 MED ORDER — ALBUTEROL SULFATE (2.5 MG/3ML) 0.083% IN NEBU: 2.5000 mg | INHALATION_SOLUTION | Freq: Four times a day (QID) | RESPIRATORY_TRACT | Status: DC | PRN

## 2015-06-30 MED ORDER — PREDNISONE 20 MG PO TABS: 40.0000 mg | ORAL_TABLET | Freq: Every day | ORAL | Status: DC

## 2015-06-30 MED ORDER — PREDNISONE 20 MG PO TABS
60.0000 mg | ORAL_TABLET | Freq: Once | ORAL | Status: AC
  Administered 2015-06-30: 60 mg via ORAL
  Filled 2015-06-30: qty 3

## 2015-06-30 NOTE — ED Notes (Signed)
MD at bedside. 

## 2015-06-30 NOTE — ED Notes (Signed)
Patient d/c'd self care.  F/U and medications reviewed.  Patient verbalized understanding. 

## 2015-06-30 NOTE — ED Notes (Signed)
Pt walked back from xray

## 2015-06-30 NOTE — ED Provider Notes (Signed)
CSN: [id number]     Arrival date & time 06/30/15  2109 History   First MD Initiated Contact with Patient 06/30/15 2139     Chief Complaint  Patient presents with  . URI  . Emesis     (Consider location/radiation/quality/duration/timing/severity/associated sxs/prior Treatment) Patient is a 40 y.o. female presenting with URI and vomiting. The history is provided by the patient and medical records. No language interpreter was used.  URI Presenting symptoms: congestion and cough   Associated symptoms: wheezing   Associated symptoms: no arthralgias, no headaches, no myalgias and no neck pain   Emesis Associated symptoms: URI   Associated symptoms: no abdominal pain, no arthralgias, no diarrhea, no headaches and no myalgias     Kara West is a 40 y.o. female  Who presents to the Emergency Department complaining of persistent cough, sob x 1 year with acute worsening over the last 2 days. Associated symptoms include post-tussive emesis and wheezing. Three neb treatments today with improvement, but not resolution, of symptoms. Seen for similar sxs on 12/11 and 12/27. Denies fever, chest pain.   Past Medical History  Diagnosis Date  . Anemia   . COPD (chronic obstructive pulmonary disease) ([place])   . Bronchitis    Past Surgical History  Procedure Laterality Date  . Cesarean section    . Tubal ligation    . Dilation and curettage of uterus     Family History  Problem Relation Age of Onset  . Hypertension Mother    Social History  Substance Use Topics  . Smoking status: Former Smoker -- 0.20 packs/day for 5 years    Types: Cigarettes    Quit date: 06/11/2014  . Smokeless tobacco: Never Used  . Alcohol Use: No   OB History    No data available     Review of Systems  Constitutional: Negative.   HENT: Positive for congestion. Negative for drooling and trouble swallowing.   Eyes: Negative for visual disturbance.  Respiratory: Positive for cough, shortness of breath and  wheezing.   Cardiovascular: Negative.   Gastrointestinal: Positive for vomiting. Negative for nausea, abdominal pain, diarrhea and constipation.  Musculoskeletal: Negative for myalgias, back pain, arthralgias and neck pain.  Skin: Negative for rash.  Neurological: Negative for dizziness, weakness and headaches.  Hematological: Does not bruise/bleed easily.      Allergies  Review of patient's allergies indicates no known allergies.  Home Medications   Prior to Admission medications   Medication Sig Start Date End Date Taking? Authorizing Provider  Albuterol Sulfate (PROAIR RESPICLICK) 123XX123 (90 Base) MCG/ACT AEPB Inhale 1 puff into the lungs every 6 (six) hours as needed (wheezing). 06/16/15  Yes Juanito Doom, MD  albuterol (PROVENTIL) (2.5 MG/3ML) 0.083% nebulizer solution Take 3 mLs (2.5 mg total) by nebulization every 6 (six) hours as needed for wheezing or shortness of breath. 06/30/15   Jaime Pilcher Ward, PA-C  HYDROcodone-homatropine [facility]) 5-1.5 MG/5ML syrup Take 5 mLs by mouth every 6 (six) hours as needed for cough. 06/30/15   Jaime Pilcher Ward, PA-C  predniSONE (DELTASONE) 20 MG tablet Take 2 tablets (40 mg total) by mouth daily. 06/30/15   Ozella Almond Ward, PA-C  Spacer/Aero-Holding Chambers (AEROCHAMBER MV) inhaler Use as instructed Patient not taking: Reported on 06/30/2015 02/18/15   Juanito Doom, MD   BP 111/73 mmHg  Pulse 95  Temp(Src) 98.1 F (36.7 C) (Oral)  Resp 22  SpO2 98%  LMP 06/23/2015 Physical Exam  Constitutional: She is oriented to person,  place, and time. She appears well-developed and well-nourished.  Alert, speaking in sentences, and in no acute distress  HENT:  Head: Normocephalic and atraumatic.  Cardiovascular: Normal rate, regular rhythm and normal heart sounds.  Exam reveals no gallop and no friction rub.   No murmur heard. Pulmonary/Chest: Effort normal. No respiratory distress. She has no rales. She exhibits no tenderness.  Faint  wheezes throughout bilateral lung fields.   Abdominal: She exhibits no mass. There is no rebound and no guarding.  Abdomen soft, non-tender, non-distended Bowel sounds positive in all four quadrants  Musculoskeletal: She exhibits no edema.  Neurological: She is alert and oriented to person, place, and time.  Skin: Skin is warm and dry. No rash noted.  Psychiatric: She has a normal mood and affect. Her behavior is normal. Judgment and thought content normal.  Nursing note and vitals reviewed.   ED Course  Procedures (including critical care time) Labs Review Labs Reviewed  BASIC METABOLIC PANEL - Abnormal; Notable for the following:    Glucose, Bld 101 (*)    All other components within normal limits  CBC WITH DIFFERENTIAL/PLATELET    Imaging Review Dg Chest 2 View  06/30/2015  CLINICAL DATA:  Productive cough, shortness of breath and chest pain. EXAM: CHEST - 2 VIEW COMPARISON:  06/17/2015 FINDINGS: The heart size and mediastinal contours are within normal limits. There is no evidence of pulmonary edema, consolidation, pneumothorax, nodule or pleural fluid. The visualized skeletal structures are unremarkable. IMPRESSION: No active disease. Electronically Signed   By: Aletta Edouard M.D.   On: 06/30/2015 21:58   I have personally reviewed and evaluated these images and lab results as part of my medical decision-making.   EKG Interpretation None      MDM   Final diagnoses:  Cough  COPD exacerbation ([place])   Kara West presents with worsening of chronic cough and sob. 3 neb treatments PTA  Labs: CBC and CMP reassuring Imaging: CXR with no acute cardiopulm dz  Therapeutics: 60mg  prednisone  A&P: COPD exacerbation  - Prednisone x 4 days  - Cough syrup  - Neb albuterol refill   - Stressed importance of pulmonary follow up for better long-term control of COPD symptoms.   Patient discussed with Dr. Oleta Mouse who agrees with treatment plan.   [facility] Ward,  PA-C 06/30/15 [id number]  Forde Dandy, MD 07/01/15 1430

## 2015-06-30 NOTE — Discharge Instructions (Signed)
Please follow up with your pulmonologist in regards to today's visit.  Take steroids as directed.  Return to ED for any new or worsening symptoms, any additional concerns.

## 2015-06-30 NOTE — ED Notes (Signed)
Pt states that she began with cough and congestion yesterday; pt states that is has gotten progressively worse today; pt states that she feels short of breath; pt reports that she has done 3 breathing treatments at home with no relief

## 2015-07-11 ENCOUNTER — Other Ambulatory Visit: Payer: Self-pay | Admitting: Pediatrics

## 2015-07-11 DIAGNOSIS — Z207 Contact with and (suspected) exposure to pediculosis, acariasis and other infestations: Secondary | ICD-10-CM

## 2015-07-11 DIAGNOSIS — Z2089 Contact with and (suspected) exposure to other communicable diseases: Secondary | ICD-10-CM

## 2015-07-11 MED ORDER — PERMETHRIN 5 % EX CREA: 1.0000 "application " | TOPICAL_CREAM | Freq: Once | CUTANEOUS | Status: DC

## 2015-07-11 NOTE — Progress Notes (Signed)
Household contact was diagnosed with scabies in clinic.  All household contacts are being treated.

## 2015-09-07 ENCOUNTER — Encounter (HOSPITAL_COMMUNITY): Payer: Self-pay | Admitting: *Deleted

## 2015-09-07 ENCOUNTER — Emergency Department (HOSPITAL_COMMUNITY)
Admission: EM | Admit: 2015-09-07 | Discharge: 2015-09-08 | Disposition: A | Discharge status: Home / Self Care | Payer: No Typology Code available for payment source | Attending: Emergency Medicine | Admitting: Emergency Medicine

## 2015-09-07 DIAGNOSIS — R05 Cough: Secondary | ICD-10-CM | POA: Diagnosis not present

## 2015-09-07 DIAGNOSIS — J441 Chronic obstructive pulmonary disease with (acute) exacerbation: Secondary | ICD-10-CM | POA: Insufficient documentation

## 2015-09-07 DIAGNOSIS — R0602 Shortness of breath: Secondary | ICD-10-CM | POA: Diagnosis present

## 2015-09-07 LAB — CBC
HCT: 41.5 % (ref 36.0–46.0)
Hemoglobin: 13.6 g/dL (ref 12.0–15.0)
MCH: 29.1 pg (ref 26.0–34.0)
MCHC: 32.8 g/dL (ref 30.0–36.0)
MCV: 88.9 fL (ref 78.0–100.0)
PLATELETS: 185 10*3/uL (ref 150–400)
RBC: 4.67 MIL/uL (ref 3.87–5.11)
RDW: 13.1 % (ref 11.5–15.5)
WBC: 5 10*3/uL (ref 4.0–10.5)

## 2015-09-07 MED ORDER — ALBUTEROL SULFATE (2.5 MG/3ML) 0.083% IN NEBU
5.0000 mg | INHALATION_SOLUTION | Freq: Once | RESPIRATORY_TRACT | Status: AC
  Administered 2015-09-07: 5 mg via RESPIRATORY_TRACT
  Filled 2015-09-07: qty 6

## 2015-09-07 NOTE — ED Notes (Signed)
Pt states that she has been short of breath that has been getting progressively worse since Saturday; pt states that she has a non-productive cough; pt states "I feel it in there it just won't come up"; pt states that she is more short of breath upon exertion

## 2015-09-08 LAB — BASIC METABOLIC PANEL
Anion gap: 8 (ref 5–15)
BUN: 15 mg/dL (ref 6–20)
CALCIUM: 8.9 mg/dL (ref 8.9–10.3)
CO2: 23 mmol/L (ref 22–32)
CREATININE: 0.77 mg/dL (ref 0.44–1.00)
Chloride: 109 mmol/L (ref 101–111)
GFR calc non Af Amer: >60 mL/min (ref >60)
GLUCOSE: 102 mg/dL — AB (ref 65–99)
Potassium: 3.2 mmol/L — ABNORMAL LOW (ref 3.5–5.1)
Sodium: 140 mmol/L (ref 135–145)

## 2015-09-08 NOTE — ED Notes (Signed)
Pt states that she does not want to wait any longer and wishes to sign out to go to another hospital; pt signs out AMA

## 2015-09-14 ENCOUNTER — Ambulatory Visit: Payer: PRIVATE HEALTH INSURANCE | Admitting: Pulmonary Disease

## 2015-09-15 ENCOUNTER — Encounter: Payer: Self-pay | Admitting: Emergency Medicine

## 2015-09-15 ENCOUNTER — Emergency Department: Payer: No Typology Code available for payment source

## 2015-09-15 ENCOUNTER — Emergency Department
Admission: EM | Admit: 2015-09-15 | Discharge: 2015-09-15 | Disposition: A | Discharge status: Home / Self Care | Payer: No Typology Code available for payment source | Attending: Emergency Medicine | Admitting: Emergency Medicine

## 2015-09-15 DIAGNOSIS — J209 Acute bronchitis, unspecified: Secondary | ICD-10-CM

## 2015-09-15 DIAGNOSIS — J441 Chronic obstructive pulmonary disease with (acute) exacerbation: Secondary | ICD-10-CM | POA: Insufficient documentation

## 2015-09-15 DIAGNOSIS — J4540 Moderate persistent asthma, uncomplicated: Secondary | ICD-10-CM | POA: Insufficient documentation

## 2015-09-15 DIAGNOSIS — Z79899 Other long term (current) drug therapy: Secondary | ICD-10-CM | POA: Insufficient documentation

## 2015-09-15 DIAGNOSIS — Z87891 Personal history of nicotine dependence: Secondary | ICD-10-CM | POA: Insufficient documentation

## 2015-09-15 MED ORDER — GUAIFENESIN-CODEINE 100-10 MG/5ML PO SYRP: 5.0000 mL | ORAL_SOLUTION | Freq: Three times a day (TID) | ORAL | Status: DC | PRN

## 2015-09-15 MED ORDER — ALBUTEROL SULFATE (2.5 MG/3ML) 0.083% IN NEBU
5.0000 mg | INHALATION_SOLUTION | Freq: Once | RESPIRATORY_TRACT | Status: AC
  Administered 2015-09-15: 5 mg via RESPIRATORY_TRACT

## 2015-09-15 MED ORDER — PREDNISONE 10 MG PO TABS: 50.0000 mg | ORAL_TABLET | Freq: Every day | ORAL | Status: DC

## 2015-09-15 MED ORDER — ALBUTEROL SULFATE (2.5 MG/3ML) 0.083% IN NEBU
INHALATION_SOLUTION | RESPIRATORY_TRACT | Status: AC
  Filled 2015-09-15: qty 6

## 2015-09-15 MED ORDER — IPRATROPIUM-ALBUTEROL 0.5-2.5 (3) MG/3ML IN SOLN
3.0000 mL | Freq: Once | RESPIRATORY_TRACT | Status: AC
Start: 2015-09-15 — End: 2015-09-15
  Administered 2015-09-15: 3 mL via RESPIRATORY_TRACT
  Filled 2015-09-15: qty 3

## 2015-09-15 MED ORDER — PREDNISONE 20 MG PO TABS
60.0000 mg | ORAL_TABLET | Freq: Once | ORAL | Status: AC
  Administered 2015-09-15: 60 mg via ORAL
  Filled 2015-09-15: qty 3

## 2015-09-15 MED ORDER — IPRATROPIUM-ALBUTEROL 18-103 MCG/ACT IN AERO: 2.0000 | INHALATION_SPRAY | Freq: Four times a day (QID) | RESPIRATORY_TRACT | Status: DC | PRN

## 2015-09-15 NOTE — ED Notes (Addendum)
Pt with shortness of breath for three days, treatments not helping at home. Pt with hx of bronchitis. Wheezing noted in triage.

## 2015-09-15 NOTE — Discharge Instructions (Signed)

## 2015-09-15 NOTE — ED Provider Notes (Signed)
[facility] Emergency Department Provider Note  ____________________________________________  Time seen: Approximately 4:08 PM  I have reviewed the triage vital signs and the nursing notes.   HISTORY  Chief Complaint Wheezing   HPI Kara West is a 40 y.o. female who presents to the emergency department for evaluation of dyspnea not relieved by albuterol at home. She states that she has a history of chronic bronchitis and sees a pulmonologist in [place], but has not had an appointment since last year. She states that 3 days ago she began having shortness of breath and used her albuterol as prescribed, but did not get any relief. She reports that she is out of her Combivent inhaler and does not have a prescription refill. She reports a nonproductive cough that is often persistent.   Past Medical History  Diagnosis Date  . Anemia   . COPD (chronic obstructive pulmonary disease) ([place])   . Bronchitis     Patient Active Problem List   Diagnosis Date Noted  . Moderate Persistent Asthma 02/18/2015  . Allergic rhinitis 02/18/2015  . COPD exacerbation ([place]) 12/01/2014    Past Surgical History  Procedure Laterality Date  . Cesarean section    . Tubal ligation    . Dilation and curettage of uterus      Current Outpatient Rx  Name  Route  Sig  Dispense  Refill  . albuterol (PROVENTIL) (2.5 MG/3ML) 0.083% nebulizer solution   Nebulization   Take 3 mLs (2.5 mg total) by nebulization every 6 (six) hours as needed for wheezing or shortness of breath. Patient not taking: Reported on 09/07/2015   75 mL   12   . Albuterol Sulfate (PROAIR RESPICLICK) [id number] (90 Base) MCG/ACT AEPB   Inhalation   Inhale 1 puff into the lungs every 6 (six) hours as needed (wheezing). Patient not taking: Reported on 09/07/2015   1 each   2   . albuterol-ipratropium (COMBIVENT) 18-103 MCG/ACT inhaler   Inhalation   Inhale 2 puffs into the lungs 4 (four) times daily as  needed for wheezing.   1 Inhaler   2   . guaiFENesin-codeine (ROBITUSSIN AC) 100-10 MG/5ML syrup   Oral   Take 5 mLs by mouth 3 (three) times daily as needed for cough.   120 mL   0   . permethrin (ACTICIN) 5 % cream   Topical   Apply 1 application topically once. Patient not taking: Reported on 09/07/2015   60 g   0   . predniSONE (DELTASONE) 10 MG tablet   Oral   Take 5 tablets (50 mg total) by mouth daily.   25 tablet   0   . Spacer/Aero-Holding Chambers (AEROCHAMBER MV) inhaler      Use as instructed Patient not taking: Reported on 06/30/2015   1 each   0     Allergies Review of patient's allergies indicates no known allergies.  Family History  Problem Relation Age of Onset  . Hypertension Mother     Social History Social History  Substance Use Topics  . Smoking status: Former Smoker -- 0.20 packs/day for 5 years    Types: Cigarettes    Quit date: 06/11/2014  . Smokeless tobacco: Never Used  . Alcohol Use: 0.0 oz/week    0 Standard drinks or equivalent per week     Comment: occ    Review of Systems Constitutional: No fever/chills Eyes: No visual changes. ENT: No sore throat. Cardiovascular: Denies chest pain. Respiratory: Positive for shortness  of breath. Positive for cough. Gastrointestinal: Negative for abdominal pain. No nausea,  no vomiting.  No diarrhea.  Genitourinary: Negative for dysuria. Musculoskeletal: Negative for body aches Skin: Negative for rash. Neurological: Negative for headaches, Negative for focal weakness or numbness. ____________________________________________   PHYSICAL EXAM:  VITAL SIGNS: ED Triage Vitals  Enc Vitals Group     BP 09/15/15 1533 105/75 mmHg     Pulse Rate 09/15/15 1533 84     Resp 09/15/15 1533 24     Temp 09/15/15 1533 98.4 F (36.9 C)     Temp Source 09/15/15 1533 Oral     SpO2 09/15/15 1533 98 %     Weight 09/15/15 1533 207 lb (93.895 kg)     Height 09/15/15 1533 5\' 9"  (1.753 m)     Head Cir  --      Peak Flow --      Pain Score --      Pain Loc --      Pain Edu? --      Excl. in [place]? --     Constitutional: Alert and oriented.Acutely ill appearing and in no acute distress. Eyes: Conjunctivae are normal. Head: Atraumatic. Nose: No congestion/rhinnorhea. Mouth/Throat: Mucous membranes are moist.  Oropharynx nonerythematous without tonsillar edema or exudate.. Neck: No stridor.  Lymphatic: No cervical lymphadenopathy. Cardiovascular: Normal rate, regular rhythm. Grossly normal heart sounds.  Good peripheral circulation. Respiratory: Normal respiratory effort.  No retractions. Expiratory wheezes noted in bilateral bases.. Gastrointestinal: Soft and nontender. No distention. No abdominal bruits. No CVA tenderness. Musculoskeletal: No joint pain reported. Neurologic:  Normal speech and language. No gross focal neurologic deficits are appreciated. Speech is normal. No gait instability. Skin:  Skin is warm, dry and intact. No rash noted. Psychiatric: Mood and affect are normal. Speech and behavior are normal.  ____________________________________________   LABS (all labs ordered are listed, but only abnormal results are displayed)  Labs Reviewed - No data to display ____________________________________________  EKG   ____________________________________________  RADIOLOGY  No active cardiopulmonary disease per radiology. ____________________________________________   PROCEDURES  Procedure(s) performed: None  Critical Care performed: No  ____________________________________________   INITIAL IMPRESSION / ASSESSMENT AND PLAN / ED COURSE  Pertinent labs & imaging results that were available during my care of the patient were reviewed by me and considered in my medical decision making (see chart for details).   Symptoms relieved after administration of DuoNeb in the emergency department. She will have a five-day course of prednisone and a refill of her  Combivent inhaler. She was instructed to call and schedule follow-up appointment with her pulmonologist in [place]. She was advised to return to the emergency department for symptoms that change or worsen if she is unable to schedule an appointment. ____________________________________________   FINAL CLINICAL IMPRESSION(S) / ED DIAGNOSES  Final diagnoses:  Bronchitis, acute, with bronchospasm        Victorino Dike, FNP 09/15/15 [place], FNP 09/15/15 1723  Eula Listen, MD 09/16/15 1844

## 2015-10-21 IMAGING — CR DG CHEST 2V
2 series · 2 of 2 positions shown · non-contrast
Comparison: PA and lateral chest 06/01/2014 and 09/21/2011.

CLINICAL DATA: Productive cough for 4 days. Vomiting today. Initial
encounter.

EXAM:
CHEST  2 VIEW

[w chest pa]
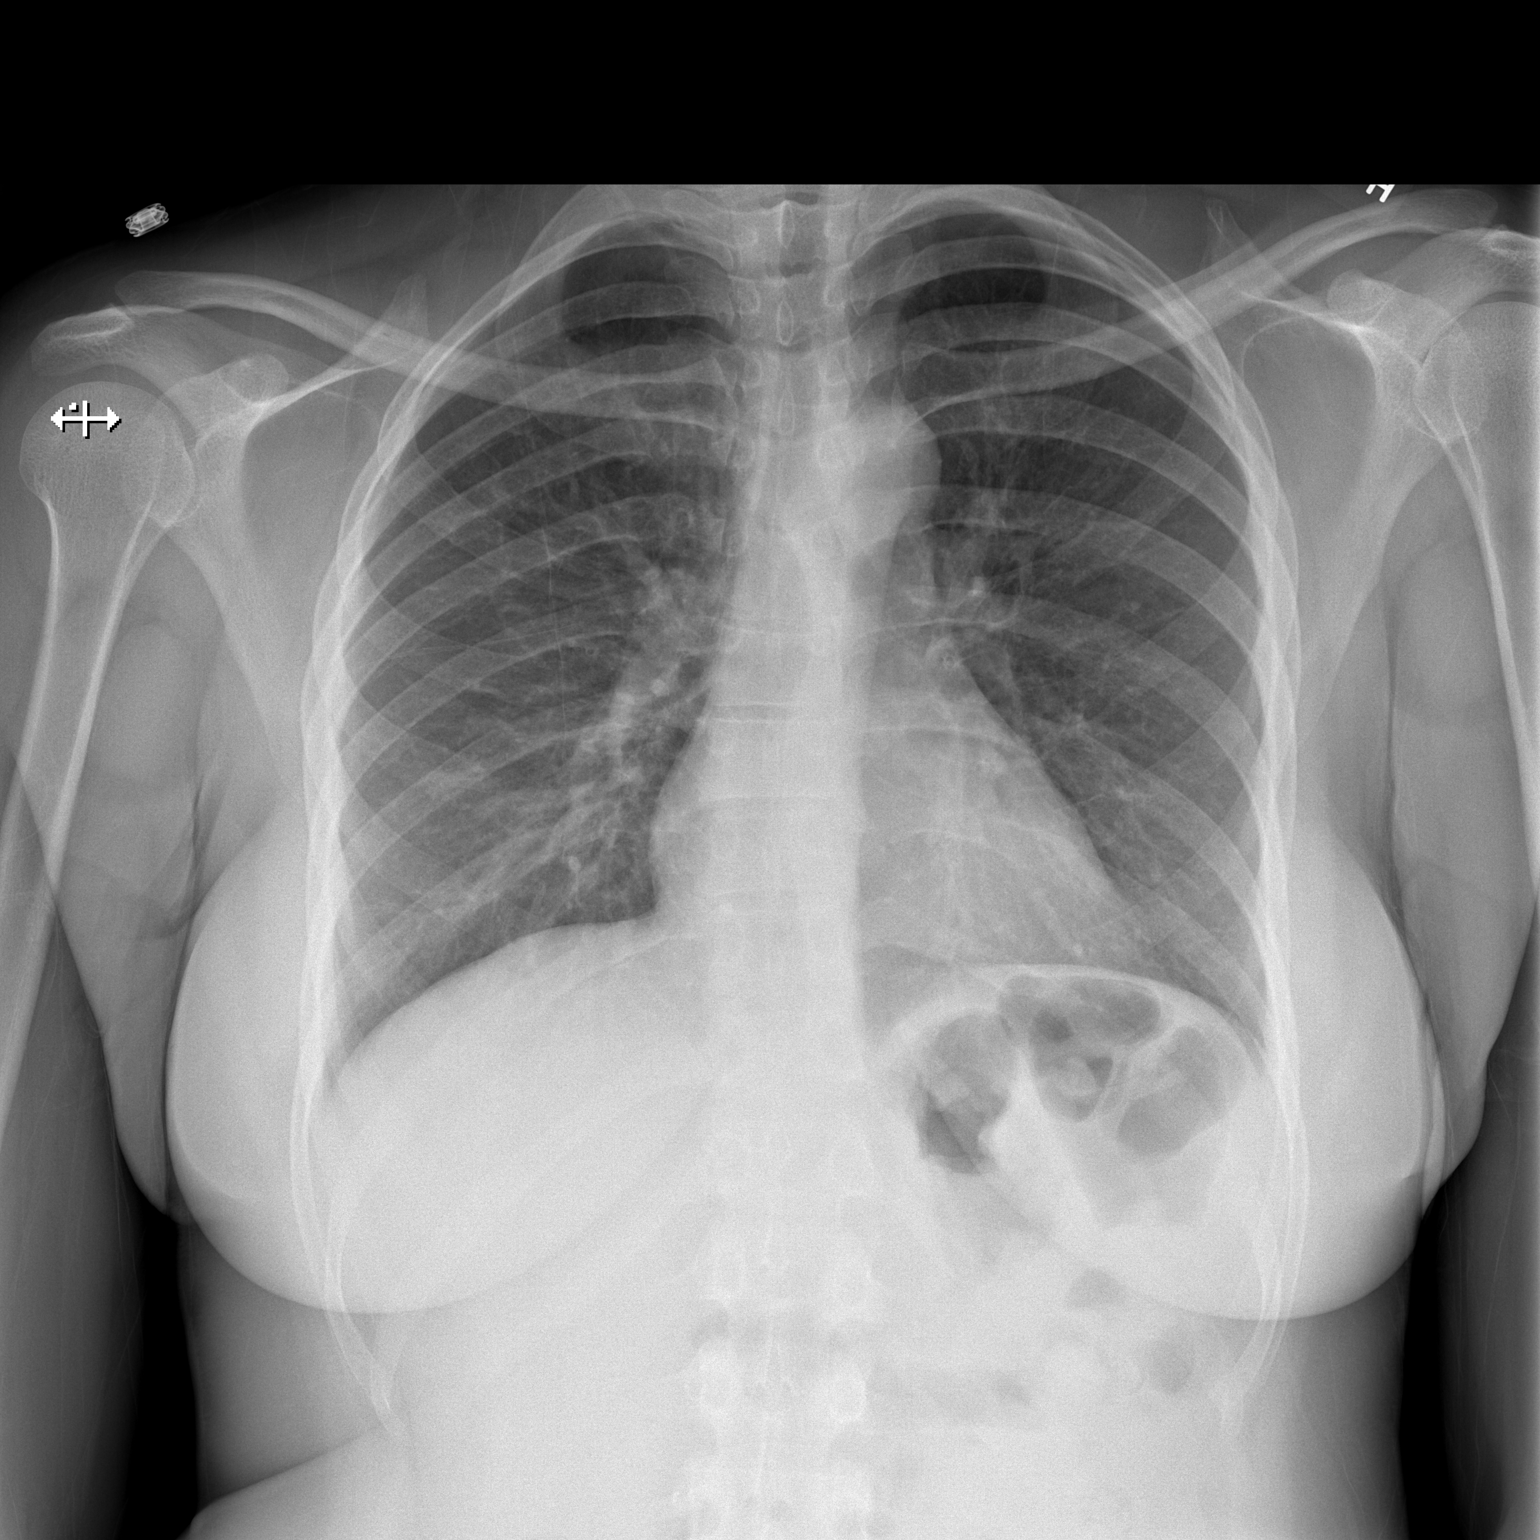

[w chest lat]
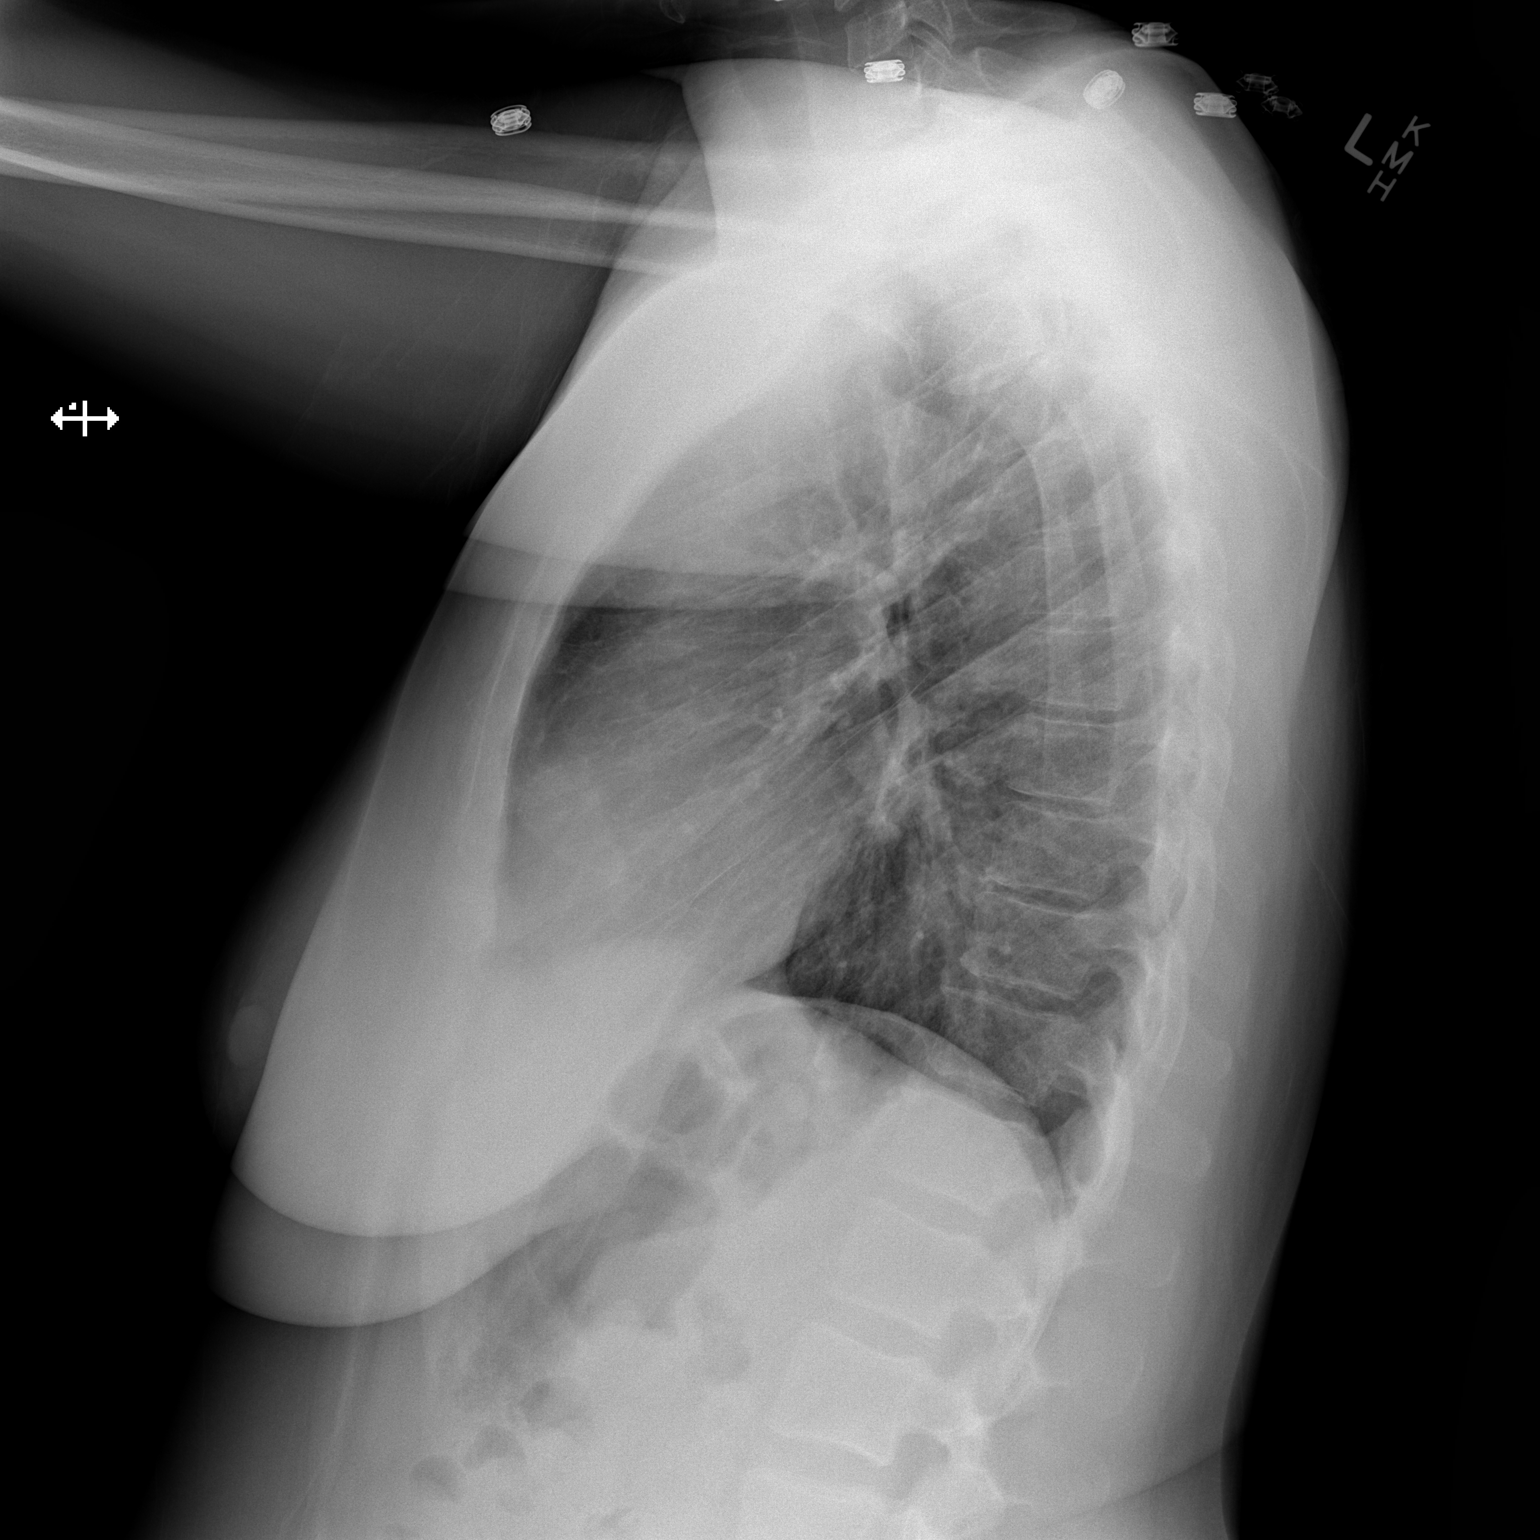

[2 of 2 positions shown; findings below may reference images not displayed]

FINDINGS: Heart size and mediastinal contours are within normal limits. Both
lungs are clear. Visualized skeletal structures are unremarkable.
IMPRESSION: Negative exam.

## 2015-10-24 ENCOUNTER — Emergency Department
Admission: EM | Admit: 2015-10-24 | Discharge: 2015-10-24 | Disposition: A | Discharge status: Home / Self Care | Payer: No Typology Code available for payment source | Attending: Emergency Medicine | Admitting: Emergency Medicine

## 2015-10-24 ENCOUNTER — Encounter (HOSPITAL_COMMUNITY): Payer: Self-pay | Admitting: Emergency Medicine

## 2015-10-24 ENCOUNTER — Emergency Department: Payer: No Typology Code available for payment source

## 2015-10-24 ENCOUNTER — Emergency Department (HOSPITAL_COMMUNITY)
Admission: EM | Admit: 2015-10-24 | Discharge: 2015-10-24 | Disposition: A | Discharge status: Home / Self Care | Payer: No Typology Code available for payment source | Attending: Emergency Medicine | Admitting: Emergency Medicine

## 2015-10-24 DIAGNOSIS — Z862 Personal history of diseases of the blood and blood-forming organs and certain disorders involving the immune mechanism: Secondary | ICD-10-CM | POA: Insufficient documentation

## 2015-10-24 DIAGNOSIS — Z5321 Procedure and treatment not carried out due to patient leaving prior to being seen by health care provider: Secondary | ICD-10-CM | POA: Insufficient documentation

## 2015-10-24 DIAGNOSIS — J441 Chronic obstructive pulmonary disease with (acute) exacerbation: Secondary | ICD-10-CM | POA: Insufficient documentation

## 2015-10-24 DIAGNOSIS — R062 Wheezing: Secondary | ICD-10-CM

## 2015-10-24 DIAGNOSIS — Z87891 Personal history of nicotine dependence: Secondary | ICD-10-CM | POA: Insufficient documentation

## 2015-10-24 DIAGNOSIS — R05 Cough: Secondary | ICD-10-CM

## 2015-10-24 DIAGNOSIS — R059 Cough, unspecified: Secondary | ICD-10-CM

## 2015-10-24 DIAGNOSIS — Z79899 Other long term (current) drug therapy: Secondary | ICD-10-CM | POA: Insufficient documentation

## 2015-10-24 LAB — CBC WITH DIFFERENTIAL/PLATELET
Basophils Absolute: 0 10*3/uL (ref 0.0–0.1)
Basophils Relative: 0 %
Eosinophils Absolute: 0.3 10*3/uL (ref 0.0–0.7)
Eosinophils Relative: 6 %
HEMATOCRIT: 42.5 % (ref 36.0–46.0)
HEMOGLOBIN: 14.1 g/dL (ref 12.0–15.0)
LYMPHS ABS: 2.3 10*3/uL (ref 0.7–4.0)
Lymphocytes Relative: 45 %
MCH: 28.4 pg (ref 26.0–34.0)
MCHC: 33.2 g/dL (ref 30.0–36.0)
MCV: 85.7 fL (ref 78.0–100.0)
MONOS PCT: 4 %
Monocytes Absolute: 0.2 10*3/uL (ref 0.1–1.0)
NEUTROS ABS: 2.3 10*3/uL (ref 1.7–7.7)
Neutrophils Relative %: 45 %
Platelets: 181 10*3/uL (ref 150–400)
RBC: 4.96 MIL/uL (ref 3.87–5.11)
RDW: 13.2 % (ref 11.5–15.5)
WBC: 5.2 10*3/uL (ref 4.0–10.5)

## 2015-10-24 LAB — BASIC METABOLIC PANEL
ANION GAP: 10 (ref 5–15)
BUN: 10 mg/dL (ref 6–20)
CALCIUM: 9.1 mg/dL (ref 8.9–10.3)
CHLORIDE: 104 mmol/L (ref 101–111)
CO2: 23 mmol/L (ref 22–32)
CREATININE: 0.63 mg/dL (ref 0.44–1.00)
GFR calc Af Amer: >60 mL/min (ref >60)
GFR calc non Af Amer: >60 mL/min (ref >60)
GLUCOSE: 107 mg/dL — AB (ref 65–99)
Potassium: 4 mmol/L (ref 3.5–5.1)
Sodium: 137 mmol/L (ref 135–145)

## 2015-10-24 LAB — BRAIN NATRIURETIC PEPTIDE: B Natriuretic Peptide: 9.6 pg/mL (ref 0.0–100.0)

## 2015-10-24 MED ORDER — DEXAMETHASONE SODIUM PHOSPHATE 10 MG/ML IJ SOLN
10.0000 mg | Freq: Once | INTRAMUSCULAR | Status: AC
  Administered 2015-10-24: 10 mg via INTRAVENOUS
  Filled 2015-10-24: qty 1

## 2015-10-24 MED ORDER — GUAIFENESIN-CODEINE 100-10 MG/5ML PO SYRP: 5.0000 mL | ORAL_SOLUTION | Freq: Three times a day (TID) | ORAL | Status: DC | PRN

## 2015-10-24 MED ORDER — ALBUTEROL SULFATE (2.5 MG/3ML) 0.083% IN NEBU
5.0000 mg | INHALATION_SOLUTION | Freq: Once | RESPIRATORY_TRACT | Status: AC
  Administered 2015-10-24: 5 mg via RESPIRATORY_TRACT
  Filled 2015-10-24: qty 6

## 2015-10-24 MED ORDER — PREDNISONE 10 MG PO TABS: 40.0000 mg | ORAL_TABLET | Freq: Every day | ORAL | Status: DC

## 2015-10-24 NOTE — ED Notes (Signed)
Pt from home with recurrent bronchitis related SOB. Pt states that she has used her neb at home and her inhaler at home with no relief. Pt has audible wheezing respirations. Pt states that she was recently seen here for this, but was only able to get an appt with her PCP at the end of this month. Pt states that her SOB became increasingly worse today.

## 2015-10-24 NOTE — ED Notes (Addendum)
Pt reports to ED w/ c/o of SOB and had cough for lst 4 days. Pt sts she is coughing up phlem. Pt sts that she has been using her inhaler but still remains SOB.  Denies n/v/d, CP, LOC or dizziness.  Pt sts that it feels "like I can't get any breath at all".

## 2015-10-24 NOTE — ED Provider Notes (Signed)
CSN: [id number]     Arrival date & time 10/24/15  1949 History   First MD Initiated Contact with Patient 10/24/15 2031     Chief Complaint  Patient presents with  . Shortness of Breath     (Consider location/radiation/quality/duration/timing/severity/associated sxs/prior Treatment) HPI   Patient is a 40 year old female with a history of COPD who presents the ED with 3 days of progressively worsening dry cough and shortness of breath. Associated symptoms include sore throat, sinus congestion, runny nose, and chest tightness worse with cough. Patient tried her albuterol neb at home with no relief. This is similar to a COPD exacerbation she has had the past. She denies fever, chills, abdominal pain, nausea, vomiting, sick contacts, recent travel. Patient states she has had to sleep upright on numerous pillows for the past 3 weeks cause she feels like she cannot breath if she lays flat. She states it feels like her throat is going to close on her. Patient also states she's been experiencing increasing shortness of breath with minimal activities for the last 3 weeks.  Past Medical History  Diagnosis Date  . Anemia   . COPD (chronic obstructive pulmonary disease) ([place])   . Bronchitis    Past Surgical History  Procedure Laterality Date  . Cesarean section    . Tubal ligation    . Dilation and curettage of uterus     Family History  Problem Relation Age of Onset  . Hypertension Mother    Social History  Substance Use Topics  . Smoking status: Former Smoker -- 0.20 packs/day for 5 years    Types: Cigarettes    Quit date: 06/11/2014  . Smokeless tobacco: Never Used  . Alcohol Use: 0.0 oz/week    0 Standard drinks or equivalent per week     Comment: occ   OB History    No data available     Review of Systems  Constitutional: Negative for fever and chills.  HENT: Positive for congestion, rhinorrhea and sore throat. Negative for trouble swallowing.   Eyes: Negative for visual  disturbance.  Respiratory: Positive for cough, chest tightness, shortness of breath and wheezing.   Cardiovascular: Negative for chest pain and leg swelling.  Gastrointestinal: Negative for nausea, vomiting, abdominal pain and abdominal distention.  Musculoskeletal: Negative for myalgias, arthralgias and neck pain.  Skin: Negative for rash.  Allergic/Immunologic: Negative for immunocompromised state.  Neurological: Negative for dizziness, syncope, speech difficulty, weakness, light-headedness and headaches.  Psychiatric/Behavioral: Negative for confusion and agitation.      Allergies  Review of patient's allergies indicates no known allergies.  Home Medications   Prior to Admission medications   Medication Sig Start Date End Date Taking? Authorizing Provider  albuterol (PROVENTIL) (2.5 MG/3ML) 0.083% nebulizer solution Take 3 mLs (2.5 mg total) by nebulization every 6 (six) hours as needed for wheezing or shortness of breath. Patient not taking: Reported on 09/07/2015 06/30/15   [facility], PA-C  Albuterol Sulfate (PROAIR RESPICLICK) 123XX123 (90 Base) MCG/ACT AEPB Inhale 1 puff into the lungs every 6 (six) hours as needed (wheezing). Patient not taking: Reported on 09/07/2015 06/16/15   Juanito Doom, MD  albuterol-ipratropium [facility]) [phone] MCG/ACT inhaler Inhale 2 puffs into the lungs 4 (four) times daily as needed for wheezing. 09/15/15 09/14/16  Victorino Dike, FNP  guaiFENesin-codeine (ROBITUSSIN AC) 100-10 MG/5ML syrup Take 5 mLs by mouth 3 (three) times daily as needed for cough. 10/24/15   Kalman Drape, PA  permethrin (ACTICIN) 5 % cream  Apply 1 application topically once. Patient not taking: Reported on 09/07/2015 07/11/15   Karlene Einstein, MD  predniSONE (DELTASONE) 10 MG tablet Take 4 tablets (40 mg total) by mouth daily. 10/24/15   Kalman Drape, PA  Spacer/Aero-Holding Chambers (AEROCHAMBER MV) inhaler Use as instructed Patient not taking: Reported on 06/30/2015  02/18/15   Juanito Doom, MD   BP 121/87 mmHg  Pulse 83  Temp(Src) 98.1 F (36.7 C) (Oral)  Resp 20  SpO2 97%  LMP 10/10/2015 Physical Exam  Constitutional: She appears well-developed and well-nourished. No distress.  HENT:  Head: Normocephalic and atraumatic.  Mouth/Throat: Uvula is midline and mucous membranes are normal. Posterior oropharyngeal erythema present. No oropharyngeal exudate, posterior oropharyngeal edema or tonsillar abscesses.  Eyes: Conjunctivae are normal.  Neck: Normal range of motion. No tracheal deviation present.  Cardiovascular: Normal rate and normal heart sounds.   Pulses:      Radial pulses are 2+ on the right side, and 2+ on the left side.  Pulmonary/Chest: Effort normal.  Inspiratory and expiratory wheezes heard throughout all lung fields, some accessory muscle use,   Musculoskeletal: Normal range of motion.  Neurological: She is alert. Coordination normal.  Skin: Skin is warm and dry. No rash noted. She is not diaphoretic.  Psychiatric: She has a normal mood and affect. Her behavior is normal.    ED Course  Procedures (including critical care time) Labs Review Labs Reviewed  BASIC METABOLIC PANEL - Abnormal; Notable for the following:    Glucose, Bld 107 (*)    All other components within normal limits  CBC WITH DIFFERENTIAL/PLATELET  BRAIN NATRIURETIC PEPTIDE    Imaging Review Dg Chest 2 View  10/24/2015  CLINICAL DATA:  Patient with cough and shortness of breath for 4 days. EXAM: CHEST  2 VIEW COMPARISON:  Chest radiograph 09/15/2015. FINDINGS: Stable cardiac and mediastinal contours. No consolidative pulmonary opacities. No pleural effusion or pneumothorax. Regional skeleton is unremarkable. IMPRESSION: No active cardiopulmonary disease. Electronically Signed   By: Lovey Newcomer M.D.   On: 10/24/2015 18:16   I have personally reviewed and evaluated these images and lab results as part of my medical decision-making.   EKG  Interpretation None      MDM   Final diagnoses:  COPD exacerbation ([place])  Wheezing  Cough    Patient with O2 saturations maintained >90, no current signs of respiratory distress, VVS. Lung exam improved after nebulizer treatment. Decadron given in the ED and pt will bd dc with 5 day burst. Pt states they are breathing at baseline and feels much better. Pt has been instructed to continue using prescribed medications and to speak with PCP and pulmonologist about today's exacerbation. Discussed strict return precautions  I discussed all of the results with the patient and she expressed understanding to the verbal discharge instructions.      Kalman Drape, [place] 10/24/15 2312  Virgel Manifold, MD 10/29/15 1251

## 2015-10-24 NOTE — Discharge Instructions (Signed)
Follow-up with your primary care provider and your pulmonologist on Monday regarding your visit to the emergency department today.  Take your prednisone as prescribed, take OTC Zyrtec once daily, and the cough syrup as needed.   Return to the emergency department if you experience worsening chest tightness, wheezing, chest pain, shortness of breath, fever, chills or worsening productive cough.   I hope you feel better!!!  Chronic Obstructive Pulmonary Disease Exacerbation Chronic obstructive pulmonary disease (COPD) is a common lung condition in which airflow from the lungs is limited. COPD is a general term that can be used to describe many different lung problems that limit airflow, including chronic bronchitis and emphysema. COPD exacerbations are episodes when breathing symptoms become much worse and require extra treatment. Without treatment, COPD exacerbations can be life threatening, and frequent COPD exacerbations can cause further damage to your lungs. CAUSES  Respiratory infections.  Exposure to smoke.  Exposure to air pollution, chemical fumes, or dust. Sometimes there is no apparent cause or trigger. RISK FACTORS  Smoking cigarettes.  Older age.  Frequent prior COPD exacerbations. SIGNS AND SYMPTOMS  Increased coughing.  Increased thick spit (sputum) production.  Increased wheezing.  Increased shortness of breath.  Rapid breathing.  Chest tightness. DIAGNOSIS Your medical history, a physical exam, and tests will help your health care provider make a diagnosis. Tests may include:  A chest X-ray.  Basic lab tests.  Sputum testing.  An arterial blood gas test. TREATMENT Depending on the severity of your COPD exacerbation, you may need to be admitted to a hospital for treatment. Some of the treatments commonly used to treat COPD exacerbations are:   Antibiotic medicines.  Bronchodilators. These are drugs that expand the air passages. They may be given with  an inhaler or nebulizer. Spacer devices may be needed to help improve drug delivery.  Corticosteroid medicines.  Supplemental oxygen therapy.  Airway clearing techniques, such as noninvasive ventilation (NIV) and positive expiratory pressure (PEP). These provide respiratory support through a mask or other noninvasive device. HOME CARE INSTRUCTIONS  Do not smoke. Quitting smoking is very important to prevent COPD from getting worse and exacerbations from happening as often.  Avoid exposure to all substances that irritate the airway, especially to tobacco smoke.  If you were prescribed an antibiotic medicine, finish it all even if you start to feel better.  Take all medicines as directed by your health care provider.It is important to use correct technique with inhaled medicines.  Drink enough fluids to keep your urine clear or pale yellow (unless you have a medical condition that requires fluid restriction).  Use a cool mist vaporizer. This makes it easier to clear your chest when you cough.  If you have a home nebulizer and oxygen, continue to use them as directed.  Maintain all necessary vaccinations to prevent infections.  Exercise regularly.  Eat a healthy diet.  Keep all follow-up appointments as directed by your health care provider. SEEK IMMEDIATE MEDICAL CARE IF:  You have worsening shortness of breath.  You have trouble talking.  You have severe chest pain.  You have blood in your sputum.  You have a fever.  You have weakness, vomit repeatedly, or faint.  You feel confused.  You continue to get worse. MAKE SURE YOU:  Understand these instructions.  Will watch your condition.  Will get help right away if you are not doing well or get worse.   This information is not intended to replace advice given to you by your  health care provider. Make sure you discuss any questions you have with your health care provider.   Document Released: 04/03/2007 Document  Revised: 06/27/2014 Document Reviewed: 02/08/2013 Elsevier Interactive Patient Education [employer].

## 2015-12-08 ENCOUNTER — Ambulatory Visit: Payer: No Typology Code available for payment source | Admitting: Pulmonary Disease

## 2015-12-08 NOTE — Progress Notes (Signed)
No show

## 2016-04-25 ENCOUNTER — Emergency Department
Admission: EM | Admit: 2016-04-25 | Discharge: 2016-04-25 | Disposition: A | Discharge status: Home / Self Care | Payer: No Typology Code available for payment source | Attending: Emergency Medicine | Admitting: Emergency Medicine

## 2016-04-25 DIAGNOSIS — J449 Chronic obstructive pulmonary disease, unspecified: Secondary | ICD-10-CM | POA: Insufficient documentation

## 2016-04-25 DIAGNOSIS — Z87891 Personal history of nicotine dependence: Secondary | ICD-10-CM | POA: Insufficient documentation

## 2016-04-25 DIAGNOSIS — J209 Acute bronchitis, unspecified: Secondary | ICD-10-CM | POA: Insufficient documentation

## 2016-04-25 MED ORDER — AZITHROMYCIN 250 MG PO TABS: ORAL_TABLET | ORAL | 0 refills | Status: DC

## 2016-04-25 MED ORDER — PREDNISONE 20 MG PO TABS
40.0000 mg | ORAL_TABLET | Freq: Once | ORAL | Status: AC
  Administered 2016-04-25: 40 mg via ORAL
  Filled 2016-04-25: qty 2

## 2016-04-25 MED ORDER — AZITHROMYCIN 500 MG PO TABS
500.0000 mg | ORAL_TABLET | Freq: Once | ORAL | Status: AC
  Administered 2016-04-25: 500 mg via ORAL
  Filled 2016-04-25: qty 1

## 2016-04-25 MED ORDER — PREDNISONE 20 MG PO TABS: 40.0000 mg | ORAL_TABLET | Freq: Every day | ORAL | 0 refills | Status: DC

## 2016-04-25 NOTE — Discharge Instructions (Signed)
We believe that your symptoms are caused today by an exacerbation of ybronchitis.  Please take the prescribed medications and any medications that you have at home for your COPD.  Follow up with your doctor as recommended.  If you develop any new or worsening symptoms, including but not limited to fever, persistent vomiting, worsening shortness of breath, or other symptoms that concern you, please return to the Emergency Department immediately.

## 2016-04-25 NOTE — ED Notes (Signed)
Pt reports headache since Saturday and a non-productive cough since Thursday - pt was dx with bronchitis last year and since then has been having problems with coughing and wheezing off and on - pt was rx'd albuterol inhaler and albuterol nebulizer - pt has been using inhaler and nebulizer but symptoms have not improved - pt reports chills but has not checked temperature - denies nausea/vomiting

## 2016-04-25 NOTE — ED Provider Notes (Signed)
[facility] Emergency Department Provider Note   ____________________________________________   First MD Initiated Contact with Patient 04/25/16 1819     (approximate)  I have reviewed the triage vital signs and the nursing notes.   HISTORY  Chief Complaint Cough and Headache    HPI Kara West is a 40 y.o. Kara West has a history of COPD and asthma. Has also had bronchitis in the past  For about the last 5 days she is been having a nonproductive cough, occasionally she's felt slightly wheezy which is improved with her albuterol's. She had a persistent cough the last 5 days, feels generally fatigued, scratchy throat,  She has not had a measured fever but has felt warm and chilled at times. No abdominal pain nausea or vomiting. She had a slight headache Saturday, this is improved. No neck pain or stiffness.  No chest pain. Reports slight shortness of breath at times relieved by her inhalers. Presently reports she is not wheezing.  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 She denies ongoing smoking, no leg swelling, no history of blood clots. She is not pregnant. Previous tubal ligation  Past Medical History:  Diagnosis Date  . Anemia   . Bronchitis   . COPD (chronic obstructive pulmonary disease) [facility])     Patient Active Problem List   Diagnosis Date Noted  . Moderate Persistent Asthma 02/18/2015  . Allergic rhinitis 02/18/2015  . COPD exacerbation ([place]) 12/01/2014    Past Surgical History:  Procedure Laterality Date  . CESAREAN SECTION    . DILATION AND CURETTAGE OF UTERUS    . TUBAL LIGATION      Prior to Admission medications   Medication Sig Start Date End Date Taking? Authorizing Provider  albuterol (PROVENTIL) (2.5 MG/3ML) 0.083% nebulizer solution Take 3 mLs (2.5 mg total) by nebulization every 6 (six) hours as needed for wheezing or shortness of breath. Patient not taking: Reported on 09/07/2015  06/30/15   [facility] Ward, PA-C  Albuterol Sulfate (PROAIR RESPICLICK) [id number] (90 Base) MCG/ACT AEPB Inhale 1 puff into the lungs every 6 (six) hours as needed (wheezing). Patient not taking: Reported on 09/07/2015 06/16/15   Juanito Doom, MD  albuterol-ipratropium [facility]) [phone] MCG/ACT inhaler Inhale 2 puffs into the lungs 4 (four) times daily as needed for wheezing. 09/15/15 09/14/16  Victorino Dike, FNP  azithromycin (ZITHROMAX) 250 MG tablet One tab daily starting 04/26/16 04/25/16   Delman Kitten, MD  guaiFENesin-codeine [facility]) 100-10 MG/5ML syrup Take 5 mLs by mouth 3 (three) times daily as needed for cough. 10/24/15   Kalman Drape, PA  permethrin (ACTICIN) 5 % cream Apply 1 application topically once. Patient not taking: Reported on 09/07/2015 07/11/15   Karlene Einstein, MD  predniSONE (DELTASONE) 20 MG tablet Take 2 tablets (40 mg total) by mouth daily with breakfast. 04/25/16   Delman Kitten, MD  Spacer/Aero-Holding Chambers (AEROCHAMBER MV) inhaler Use as instructed Patient not taking: Reported on 06/30/2015 02/18/15   Juanito Doom, MD    Allergies Patient has no known allergies.  Family History  Problem Relation Age of Onset  . Hypertension Mother     Social History Social History  Substance Use Topics  . Smoking status: Former Smoker    Packs/day: 0.20    Years: 5.00    Types: Cigarettes    Quit date: 06/11/2014  . Smokeless tobacco: Never Used  . Alcohol use 0.0 oz/week     Comment: occ  Review of Systems Constitutional: Chills, warm at times, fatigued Eyes: No visual changes. ENT: No trouble swallowing. Feels scratchy throat at times but not painful Cardiovascular: Denies chest pain. Respiratory: Denies shortness of breath at present, see history of present illness. Gastrointestinal: No abdominal pain.  No nausea, no vomiting.  No diarrhea.  No constipation. Genitourinary: Negative for dysuria. Musculoskeletal: Negative for back pain. Skin: Negative  for rash. Neurological: Negative for headaches, focal weakness or numbness.  Report she has up-to-date albuterol inhaler prescriptions.  10-point ROS otherwise negative.  ____________________________________________   PHYSICAL EXAM:  VITAL SIGNS: ED Triage Vitals  Enc Vitals Group     BP 04/25/16 1722 116/73     Pulse Rate 04/25/16 1722 80     Resp 04/25/16 1722 18     Temp 04/25/16 1722 97.9 F (36.6 C)     Temp Source 04/25/16 1722 Oral     SpO2 04/25/16 1722 100 %     Weight 04/25/16 1723 225 lb (102.1 kg)     Height 04/25/16 1723 5\' 9"  (1.753 m)     Head Circumference --      Peak Flow --      Pain Score 04/25/16 1723 9     Pain Loc --      Pain Edu? --      Excl. in [place]? --     Constitutional: Alert and oriented. Well appearing and in no acute distress. Eyes: Conjunctivae are normal. PERRL. EOMI. Head: Atraumatic. Nose: No congestion/rhinnorhea. Mouth/Throat: Mucous membranes are moist.  Oropharynx non-erythematous. Neck: No stridor.   Cardiovascular: Normal rate, regular rhythm. Grossly normal heart sounds.  Good peripheral circulation. Respiratory: Normal respiratory effort.  No retractions. Lungs CTAB.Frequent nonproductive dry cough. Speaks in full sentences Gastrointestinal: Soft and nontender. No distention.  Musculoskeletal: No lower extremity tenderness nor edema.  No joint effusions. Neurologic:  Normal speech and language. No gross focal neurologic deficits are appreciated.  Skin:  Skin is warm, dry and intact. No rash noted. Psychiatric: Mood and affect are normal. Speech and behavior are normal.  ____________________________________________   LABS (all labs ordered are listed, but only abnormal results are displayed)  Labs Reviewed - No data to display ____________________________________________  EKG   ____________________________________________  RADIOLOGY   ____________________________________________   PROCEDURES  Procedure(s)  performed: None  Procedures  Critical Care performed: No  ____________________________________________   INITIAL IMPRESSION / ASSESSMENT AND PLAN / ED COURSE  Pertinent labs & imaging results that were available during my care of the patient were reviewed by me and considered in my medical decision making (see chart for details).  Patient just for nonproductive cough, general viral-like symptoms but does report a history of COPD and and increased work of breathing or hypoxia. Given her symptomatology reported though, I suspect likely upper respiratory infection with bronchitis and possible mild exacerbation of her underlying airway disease. We'll prescribe prednisone, azithromycin, and close follow-up. We discuss careful return precautions patient is very agreeable.  No acute cardiac symptoms. No signs or symptoms suggest pulmonary embolism, pneumothorax, severe pneumonia or indication for imaging.  Return precautions and treatment recommendations and follow-up discussed with the patient who is agreeable with the plan.   Clinical Course      ____________________________________________   FINAL CLINICAL IMPRESSION(S) / ED DIAGNOSES  Final diagnoses:  Acute bronchitis, unspecified organism      NEW MEDICATIONS STARTED DURING THIS VISIT:  New Prescriptions   AZITHROMYCIN (ZITHROMAX) 250 MG TABLET    One tab daily starting  04/26/16   PREDNISONE (DELTASONE) 20 MG TABLET    Take 2 tablets (40 mg total) by mouth daily with breakfast.     Note:  This document was prepared using Dragon voice recognition software and may include unintentional dictation errors.     Delman Kitten, MD 04/25/16 [phone]

## 2016-04-25 NOTE — ED Triage Notes (Signed)
Patient presents to the ED with cough and congestion with non-productive cough since Wednesday and headache since Saturday.  Patient reports history of chronic bronchitis but states this is worse than normal.  Patient reports using her inhaler without improvement.

## 2016-05-28 ENCOUNTER — Encounter: Payer: Self-pay | Admitting: Emergency Medicine

## 2016-05-28 ENCOUNTER — Emergency Department: Payer: No Typology Code available for payment source

## 2016-05-28 ENCOUNTER — Emergency Department
Admission: EM | Admit: 2016-05-28 | Discharge: 2016-05-28 | Disposition: A | Discharge status: Home / Self Care | Payer: No Typology Code available for payment source | Attending: Emergency Medicine | Admitting: Emergency Medicine

## 2016-05-28 DIAGNOSIS — J4541 Moderate persistent asthma with (acute) exacerbation: Secondary | ICD-10-CM | POA: Insufficient documentation

## 2016-05-28 DIAGNOSIS — J449 Chronic obstructive pulmonary disease, unspecified: Secondary | ICD-10-CM | POA: Insufficient documentation

## 2016-05-28 DIAGNOSIS — Z87891 Personal history of nicotine dependence: Secondary | ICD-10-CM | POA: Insufficient documentation

## 2016-05-28 MED ORDER — IPRATROPIUM-ALBUTEROL 0.5-2.5 (3) MG/3ML IN SOLN
3.0000 mL | Freq: Once | RESPIRATORY_TRACT | Status: AC
  Administered 2016-05-28: 3 mL via RESPIRATORY_TRACT
  Filled 2016-05-28: qty 3

## 2016-05-28 MED ORDER — BENZONATATE 100 MG PO CAPS: 100.0000 mg | ORAL_CAPSULE | Freq: Three times a day (TID) | ORAL | 0 refills | Status: DC | PRN

## 2016-05-28 MED ORDER — DEXAMETHASONE SODIUM PHOSPHATE 10 MG/ML IJ SOLN
10.0000 mg | Freq: Once | INTRAMUSCULAR | Status: AC
  Administered 2016-05-28: 10 mg via INTRAMUSCULAR
  Filled 2016-05-28: qty 1

## 2016-05-28 MED ORDER — PREDNISONE 20 MG PO TABS: 20.0000 mg | ORAL_TABLET | Freq: Two times a day (BID) | ORAL | 0 refills | Status: DC

## 2016-05-28 NOTE — Discharge Instructions (Signed)
Take the prescription meds as directed. Follow-up with Dr. Manuella Ghazi on Monday as scheduled.

## 2016-05-28 NOTE — ED Notes (Signed)
Pt complains of cough, congestion, headache and shortness of breath for last few days. Pt states she has nebulizer machine but it is messed up.

## 2016-05-28 NOTE — ED Triage Notes (Signed)
Cough x 2 days, denies fevers.

## 2016-05-28 NOTE — ED Provider Notes (Signed)
[facility] Emergency Department Provider Note ____________________________________________  Time seen: 1308  I have reviewed the triage vital signs and the nursing notes.  HISTORY  Chief Complaint  Cough  HPI Kara West is a 40 y.o. female presents to the ED for evaluation of a 2 day complaint of non-productive, cough. Patient denies any intermittent fevers, chills, or sweats. She has history of moderate persistent asthma and COPD, and uses an albuterol inhaler.She reports, however, that her nebulizer tubing is dysfunctional. As such, she has not had a nebulizer treatment in about 3 days.   Past Medical History:  Diagnosis Date  . Anemia   . Bronchitis   . COPD (chronic obstructive pulmonary disease) [facility])     Patient Active Problem List   Diagnosis Date Noted  . Moderate Persistent Asthma 02/18/2015  . Allergic rhinitis 02/18/2015  . COPD exacerbation ([place]) 12/01/2014    Past Surgical History:  Procedure Laterality Date  . CESAREAN SECTION    . DILATION AND CURETTAGE OF UTERUS    . TUBAL LIGATION      Prior to Admission medications   Medication Sig Start Date End Date Taking? Authorizing Provider  albuterol (PROVENTIL) (2.5 MG/3ML) 0.083% nebulizer solution Take 3 mLs (2.5 mg total) by nebulization every 6 (six) hours as needed for wheezing or shortness of breath. Patient not taking: Reported on 09/07/2015 06/30/15   [facility] Ward, PA-C  Albuterol Sulfate (PROAIR RESPICLICK) [id number] (90 Base) MCG/ACT AEPB Inhale 1 puff into the lungs every 6 (six) hours as needed (wheezing). Patient not taking: Reported on 09/07/2015 06/16/15   Juanito Doom, MD  albuterol-ipratropium [facility]) [phone] MCG/ACT inhaler Inhale 2 puffs into the lungs 4 (four) times daily as needed for wheezing. 09/15/15 09/14/16  Victorino Dike, FNP  azithromycin (ZITHROMAX) 250 MG tablet One tab daily starting 04/26/16 04/25/16   Delman Kitten, MD  benzonatate (TESSALON  PERLES) 100 MG capsule Take 1 capsule (100 mg total) by mouth 3 (three) times daily as needed for cough (Take 1-2 per dose). 05/28/16   Beda Dula V Bacon Liadan Guizar, PA-C  guaiFENesin-codeine (ROBITUSSIN AC) 100-10 MG/5ML syrup Take 5 mLs by mouth 3 (three) times daily as needed for cough. 10/24/15   Kalman Drape, PA  permethrin (ACTICIN) 5 % cream Apply 1 application topically once. Patient not taking: Reported on 09/07/2015 07/11/15   Karlene Einstein, MD  predniSONE (DELTASONE) 20 MG tablet Take 1 tablet (20 mg total) by mouth 2 (two) times daily with a meal. 05/28/16   Dannielle Karvonen [place], PA-C  Spacer/Aero-Holding Chambers (AEROCHAMBER MV) inhaler Use as instructed Patient not taking: Reported on 06/30/2015 02/18/15   Juanito Doom, MD    Allergies Patient has no known allergies.  Family History  Problem Relation Age of Onset  . Hypertension Mother     Social History Social History  Substance Use Topics  . Smoking status: Former Smoker    Packs/day: 0.20    Years: 5.00    Types: Cigarettes    Quit date: 06/11/2014  . Smokeless tobacco: Never Used  . Alcohol use 0.0 oz/week     Comment: occ    Review of Systems  Constitutional: Negative for fever. Eyes: Negative for visual changes. ENT: Negative for sore throat. Cardiovascular: Negative for chest pain. Respiratory: Positive for shortness of breath and cough Musculoskeletal: Negative for back pain. ____________________________________________  PHYSICAL EXAM:  VITAL SIGNS: ED Triage Vitals  Enc Vitals Group     BP 05/28/16 1235 (!)  104/55     Pulse Rate 05/28/16 1235 88     Resp 05/28/16 1235 18     Temp 05/28/16 1235 97.7 F (36.5 C)     Temp Source 05/28/16 1235 Oral     SpO2 05/28/16 1235 96 %     Weight 05/28/16 1236 234 lb (106.1 kg)     Height 05/28/16 1236 5\' 9"  (1.753 m)     Head Circumference --      Peak Flow --      Pain Score 05/28/16 1236 9     Pain Loc --      Pain Edu? --      Excl. in [place]? --      Constitutional: Alert and oriented. Well appearing and in no distress. Head: Normocephalic and atraumatic. Cardiovascular: Normal rate, regular rhythm. Normal distal pulses. Respiratory: Normal respiratory effort. No wheezes/rales/rhonchi. Musculoskeletal: Nontender with normal range of motion in all extremities.  Skin:  Skin is warm, dry and intact. No rash noted. ___________________________________________   RADIOLOGY  CXR  IMPRESSION: No active cardiopulmonary disease. ____________________________________________  PROCEDURES  DuoNeb x 1 Decadron 10 mg IM ____________________________________________  INITIAL IMPRESSION / ASSESSMENT AND PLAN / ED COURSE  Patient reports improvement of her symptoms following DuoNeb and steroid administration. She is discharged with a prescription for prednisone dose as directed. She is also provided with Ladona Ridgel. She will follow up with [facility] on Monday as scheduled for medication refills. Return precautions are reviewed.  Clinical Course    ____________________________________________  FINAL CLINICAL IMPRESSION(S) / ED DIAGNOSES  Final diagnoses:  Moderate persistent asthma with exacerbation      Melvenia Needles, PA-C 05/28/16 1543    Lisa Roca, MD 05/28/16 1553

## 2016-06-17 ENCOUNTER — Emergency Department
Admission: EM | Admit: 2016-06-17 | Discharge: 2016-06-17 | Disposition: A | Discharge status: Home / Self Care | Payer: No Typology Code available for payment source | Attending: Emergency Medicine | Admitting: Emergency Medicine

## 2016-06-17 ENCOUNTER — Emergency Department: Payer: No Typology Code available for payment source

## 2016-06-17 ENCOUNTER — Encounter: Payer: Self-pay | Admitting: Emergency Medicine

## 2016-06-17 DIAGNOSIS — Z87891 Personal history of nicotine dependence: Secondary | ICD-10-CM | POA: Insufficient documentation

## 2016-06-17 DIAGNOSIS — J441 Chronic obstructive pulmonary disease with (acute) exacerbation: Secondary | ICD-10-CM

## 2016-06-17 DIAGNOSIS — Z79899 Other long term (current) drug therapy: Secondary | ICD-10-CM | POA: Insufficient documentation

## 2016-06-17 DIAGNOSIS — J45909 Unspecified asthma, uncomplicated: Secondary | ICD-10-CM | POA: Insufficient documentation

## 2016-06-17 MED ORDER — ALBUTEROL SULFATE HFA 108 (90 BASE) MCG/ACT IN AERS: 2.0000 | INHALATION_SPRAY | Freq: Four times a day (QID) | RESPIRATORY_TRACT | 2 refills | Status: DC | PRN

## 2016-06-17 MED ORDER — PREDNISONE 10 MG PO TABS: ORAL_TABLET | ORAL | 0 refills | Status: DC

## 2016-06-17 MED ORDER — IPRATROPIUM-ALBUTEROL 0.5-2.5 (3) MG/3ML IN SOLN
3.0000 mL | Freq: Once | RESPIRATORY_TRACT | Status: AC
  Administered 2016-06-17: 3 mL via RESPIRATORY_TRACT

## 2016-06-17 NOTE — ED Notes (Signed)
See triage note   States she developed some wheezing and prod cough about 3-4 days ago  Is currently out of Symbicort and Proair inhalers

## 2016-06-17 NOTE — ED Provider Notes (Signed)
[facility] Emergency Department Provider Note  ____________________________________________   First MD Initiated Contact with Patient 06/17/16 1206     (approximate)  I have reviewed the triage vital signs and the nursing notes.   HISTORY  Chief Complaint Shortness of Breath and Cough    HPI Kara West is a 40 y.o. female is here complaining of cough and wheezing for the last 2 days. Patient states that she used the last of her nebulizer treatments this morning. Patient states she has 4 treatments yesterday. Patient has a history of COPD and has been on inhalers for this. She reports a productive cough with yellow flamed. She denies any fever, chills, nausea or vomiting. She states that normally she is not aware of wheezing. She has not been taking any other over-the-counter medication.Patient states that she was a smoker and quit one year ago. Patient states that currently she is out of her Ventolin inhaler. She denies any pain.   Past Medical History:  Diagnosis Date  . Anemia   . Bronchitis   . COPD (chronic obstructive pulmonary disease) [facility])     Patient Active Problem List   Diagnosis Date Noted  . Moderate Persistent Asthma 02/18/2015  . Allergic rhinitis 02/18/2015  . COPD exacerbation ([place]) 12/01/2014    Past Surgical History:  Procedure Laterality Date  . CESAREAN SECTION    . DILATION AND CURETTAGE OF UTERUS    . TUBAL LIGATION      Prior to Admission medications   Medication Sig Start Date End Date Taking? Authorizing Provider  albuterol (PROVENTIL HFA;VENTOLIN HFA) 108 (90 Base) MCG/ACT inhaler Inhale 2 puffs into the lungs every 6 (six) hours as needed for wheezing or shortness of breath. 06/17/16   Johnn Hai, PA-C  albuterol-ipratropium (COMBIVENT) 18-103 MCG/ACT inhaler Inhale 2 puffs into the lungs 4 (four) times daily as needed for wheezing. 09/15/15 09/14/16  Victorino Dike, FNP  predniSONE (DELTASONE) 10 MG  tablet Take 6 tablets  today, on day 2 take 5 tablets, day 3 take 4 tablets, day 4 take 3 tablets, day 5 take  2 tablets and 1 tablet the last day 06/17/16   Johnn Hai, PA-C  Spacer/Aero-Holding Chambers (AEROCHAMBER MV) inhaler Use as instructed Patient not taking: Reported on 06/30/2015 02/18/15   Juanito Doom, MD    Allergies Patient has no known allergies.  Family History  Problem Relation Age of Onset  . Hypertension Mother     Social History Social History  Substance Use Topics  . Smoking status: Former Smoker    Packs/day: 0.20    Years: 5.00    Types: Cigarettes    Quit date: 06/11/2014  . Smokeless tobacco: Never Used  . Alcohol use 0.0 oz/week     Comment: occ    Review of Systems Constitutional: No fever/chills Eyes: No visual changes. ENT: No sore throat. Cardiovascular: Denies chest pain. Respiratory: Positive shortness of breath. Positive wheezing. Positive coughing. Gastrointestinal: No abdominal pain.  No nausea, no vomiting.   Musculoskeletal: Denies muscle skeletal pain. Skin: Negative for rash. Neurological: Negative for headaches, focal weakness or numbness.  10-point ROS otherwise negative.  ____________________________________________   PHYSICAL EXAM:  VITAL SIGNS: ED Triage Vitals  Enc Vitals Group     BP 06/17/16 1137 120/82     Pulse Rate 06/17/16 1137 87     Resp 06/17/16 1137 (!) 22     Temp 06/17/16 1137 97.5 F (36.4 C)  Temp Source 06/17/16 1137 Oral     SpO2 06/17/16 1137 98 %     Weight 06/17/16 1137 234 lb (106.1 kg)     Height 06/17/16 1137 5\' 9"  (1.753 m)     Head Circumference --      Peak Flow --      Pain Score 06/17/16 1130 0     Pain Loc --      Pain Edu? --      Excl. in [place]? --     Constitutional: Alert and oriented. Well appearing and in no acute distress. Eyes: Conjunctivae are normal. PERRL. EOMI. Head: Atraumatic. Nose: Minimal congestion/rhinnorhea.  EACs and TMs are clear  bilaterally. Mouth/Throat: Mucous membranes are moist.  Oropharynx non-erythematous. Neck: No stridor.   Hematological/Lymphatic/Immunilogical: No cervical lymphadenopathy. Cardiovascular: Normal rate, regular rhythm. Grossly normal heart sounds.  Good peripheral circulation. Respiratory: Normal respiratory effort.  No retractions. Initially patient had a coarse cough with some rales however this cleared up after her first nebulizer treatment. Patient continued to improve. Musculoskeletal: Moves upper and lower extremities without any difficulty. Normal gait was noted. Neurologic:  Normal speech and language. No gross focal neurologic deficits are appreciated. No gait instability. Skin:  Skin is warm, dry and intact. No rash noted. Psychiatric: Mood and affect are normal. Speech and behavior are normal.  ____________________________________________   LABS (all labs ordered are listed, but only abnormal results are displayed)  Labs Reviewed - No data to display ____________________________________________  EKG   Per Dr. Marcelene Butte ____________________________________________  RADIOLOGY  IMPRESSION:  No active lung disease. Peribronchial thickening may indicate  bronchitis, possibly chronic.    ____________________________________________   PROCEDURES  Procedure(s) performed: None  Procedures  Critical Care performed: No  ____________________________________________   INITIAL IMPRESSION / ASSESSMENT AND PLAN / ED COURSE  Pertinent labs & imaging results that were available during my care of the patient were reviewed by me and considered in my medical decision making (see chart for details).    Clinical Course   Patient improved after her first nebulizer treatment. Patient was given a prescription for continue use of her albuterol inhaler. Patient was also given a prescription for tapering dose of prednisone beginning at 60 mg and tapering down. She'll follow-up with  her primary care doctor if any continued problems.   ____________________________________________   FINAL CLINICAL IMPRESSION(S) / ED DIAGNOSES  Final diagnoses:  COPD exacerbation ([place])      NEW MEDICATIONS STARTED DURING THIS VISIT:  Discharge Medication List as of 06/17/2016 12:21 PM    START taking these medications   Details  albuterol (PROVENTIL HFA;VENTOLIN HFA) 108 (90 Base) MCG/ACT inhaler Inhale 2 puffs into the lungs every 6 (six) hours as needed for wheezing or shortness of breath., Starting Fri 06/17/2016, Print         Note:  This document was prepared using Dragon voice recognition software and may include unintentional dictation errors.    Johnn Hai, PA-C 06/17/16 Stevenson Quigley, MD 06/18/16 [phone]

## 2016-06-17 NOTE — Discharge Instructions (Signed)
Keep your Appointment with your doctor next week. Continued taking your inhaler and begin taking prednisone as directed.

## 2016-06-17 NOTE — ED Triage Notes (Addendum)
Pt reports cough and wheezing x 2 days. Using neb treatments last used this morning. Used 4 treatments yesterday. Pt has hx of COPD and has been out of inhalers she uses daily. Pt reports productive cough with yellow phlegm.

## 2016-06-25 ENCOUNTER — Emergency Department: Payer: Self-pay

## 2016-06-25 ENCOUNTER — Encounter: Payer: Self-pay | Admitting: Emergency Medicine

## 2016-06-25 ENCOUNTER — Emergency Department
Admission: EM | Admit: 2016-06-25 | Discharge: 2016-06-25 | Disposition: A | Discharge status: Home / Self Care | Payer: Self-pay | Attending: Emergency Medicine | Admitting: Emergency Medicine

## 2016-06-25 DIAGNOSIS — R05 Cough: Secondary | ICD-10-CM | POA: Insufficient documentation

## 2016-06-25 DIAGNOSIS — J4540 Moderate persistent asthma, uncomplicated: Secondary | ICD-10-CM | POA: Insufficient documentation

## 2016-06-25 DIAGNOSIS — Z87891 Personal history of nicotine dependence: Secondary | ICD-10-CM | POA: Insufficient documentation

## 2016-06-25 DIAGNOSIS — M79605 Pain in left leg: Secondary | ICD-10-CM | POA: Insufficient documentation

## 2016-06-25 DIAGNOSIS — M79604 Pain in right leg: Secondary | ICD-10-CM | POA: Insufficient documentation

## 2016-06-25 DIAGNOSIS — Z79899 Other long term (current) drug therapy: Secondary | ICD-10-CM | POA: Insufficient documentation

## 2016-06-25 DIAGNOSIS — J441 Chronic obstructive pulmonary disease with (acute) exacerbation: Secondary | ICD-10-CM | POA: Insufficient documentation

## 2016-06-25 LAB — COMPREHENSIVE METABOLIC PANEL
ALBUMIN: 3.9 g/dL (ref 3.5–5.0)
ALK PHOS: 61 U/L (ref 38–126)
ALT: 19 U/L (ref 14–54)
ANION GAP: 8 (ref 5–15)
AST: 25 U/L (ref 15–41)
BILIRUBIN TOTAL: 0.9 mg/dL (ref 0.3–1.2)
BUN: 14 mg/dL (ref 6–20)
CALCIUM: 8.6 mg/dL — AB (ref 8.9–10.3)
CO2: 22 mmol/L (ref 22–32)
CREATININE: 0.48 mg/dL (ref 0.44–1.00)
Chloride: 106 mmol/L (ref 101–111)
GFR calc Af Amer: >60 mL/min (ref >60)
GFR calc non Af Amer: >60 mL/min (ref >60)
GLUCOSE: 110 mg/dL — AB (ref 65–99)
Potassium: 3.4 mmol/L — ABNORMAL LOW (ref 3.5–5.1)
Sodium: 136 mmol/L (ref 135–145)
TOTAL PROTEIN: 7.1 g/dL (ref 6.5–8.1)

## 2016-06-25 LAB — CK: CK TOTAL: 39 U/L (ref 38–234)

## 2016-06-25 LAB — CBC
HCT: 38.8 % (ref 35.0–47.0)
HEMOGLOBIN: 12.7 g/dL (ref 12.0–16.0)
MCH: 26.5 pg (ref 26.0–34.0)
MCHC: 32.7 g/dL (ref 32.0–36.0)
MCV: 81 fL (ref 80.0–100.0)
PLATELETS: 220 10*3/uL (ref 150–440)
RBC: 4.79 MIL/uL (ref 3.80–5.20)
RDW: 15.6 % — AB (ref 11.5–14.5)
WBC: 8.7 10*3/uL (ref 3.6–11.0)

## 2016-06-25 LAB — ABO/RH: ABO/RH(D): A POS

## 2016-06-25 NOTE — ED Notes (Signed)
NAD noted at time of D/C. Pt denies questions or concerns. Pt ambulatory to the lobby at this time.  

## 2016-06-25 NOTE — ED Notes (Signed)
Pt taken to [place] at this time by Colletta [place], [place] tech.

## 2016-06-25 NOTE — ED Triage Notes (Signed)
Pt presents c/o cold feeling in lower extremities. Pt states last time she felt like this she needed a blood transfusion (7-8 years ago). BLE feel warm to touch. Sensation intact.

## 2016-06-25 NOTE — ED Provider Notes (Signed)
[facility] Emergency Department Provider Note   ____________________________________________    I have reviewed the triage vital signs and the nursing notes.   HISTORY  Chief Complaint Anemia     HPI Kara West is a 41 y.o. female who presents with complaints of bilateral lower extremity pain. Patient reports her legs feel sore. She states the last time this happened she had severe anemia. She denies any injury to the area. No recent travel. No history of blood clots. No fevers or chills. No recent exercise.  Noted medications   Past Medical History:  Diagnosis Date  . Anemia   . Bronchitis   . COPD (chronic obstructive pulmonary disease) [facility])     Patient Active Problem List   Diagnosis Date Noted  . Moderate Persistent Asthma 02/18/2015  . Allergic rhinitis 02/18/2015  . COPD exacerbation ([place]) 12/01/2014    Past Surgical History:  Procedure Laterality Date  . CESAREAN SECTION    . DILATION AND CURETTAGE OF UTERUS    . TUBAL LIGATION      Prior to Admission medications   Medication Sig Start Date End Date Taking? Authorizing Provider  albuterol (PROVENTIL HFA;VENTOLIN HFA) 108 (90 Base) MCG/ACT inhaler Inhale 2 puffs into the lungs every 6 (six) hours as needed for wheezing or shortness of breath. 06/17/16   Johnn Hai, PA-C  albuterol-ipratropium (COMBIVENT) 18-103 MCG/ACT inhaler Inhale 2 puffs into the lungs 4 (four) times daily as needed for wheezing. 09/15/15 09/14/16  Victorino Dike, FNP  predniSONE (DELTASONE) 10 MG tablet Take 6 tablets  today, on day 2 take 5 tablets, day 3 take 4 tablets, day 4 take 3 tablets, day 5 take  2 tablets and 1 tablet the last day 06/17/16   Johnn Hai, PA-C  Spacer/Aero-Holding Chambers (AEROCHAMBER MV) inhaler Use as instructed Patient not taking: Reported on 06/30/2015 02/18/15   Juanito Doom, MD     Allergies Patient has no known allergies.  Family History  Problem  Relation Age of Onset  . Hypertension Mother     Social History Social History  Substance Use Topics  . Smoking status: Former Smoker    Packs/day: 0.20    Years: 5.00    Types: Cigarettes    Quit date: 06/11/2014  . Smokeless tobacco: Never Used  . Alcohol use 0.0 oz/week     Comment: occ    Review of Systems  Constitutional: No fever/chills  Cardiovascular: Denies chest pain. Respiratory: Positive cough, no shortness of breath, no pleurisy Gastrointestinal:   No nausea, no vomiting.   Genitourinary: Negative for dysuria. Musculoskeletal: Leg pain as above Skin: Negative for rash. Neurological: Negative for headaches, no focal deficits  10-point ROS otherwise negative.  ____________________________________________   PHYSICAL EXAM:  VITAL SIGNS: ED Triage Vitals  Enc Vitals Group     BP 06/25/16 1218 123/70     Pulse Rate 06/25/16 1218 (!) 116     Resp 06/25/16 1218 20     Temp 06/25/16 1218 98.4 F (36.9 C)     Temp Source 06/25/16 1218 Oral     SpO2 06/25/16 1218 97 %     Weight 06/25/16 1217 225 lb (102.1 kg)     Height 06/25/16 1217 5\' 7"  (1.702 m)     Head Circumference --      Peak Flow --      Pain Score 06/25/16 1217 10     Pain Loc --  Pain Edu? --      Excl. in [place]? --     Constitutional: Alert and oriented. No acute distress. Pleasant and interactive Eyes: Conjunctivae are normal.  Head: Atraumatic. Nose: No congestion/rhinnorhea. Mouth/Throat: Mucous membranes are moist.    Cardiovascular:Mild tachycardia regular rhythm. Grossly normal heart sounds.  Good peripheral circulation. Respiratory: Normal respiratory effort.  No retractions. Scattered mild wheezes Gastrointestinal: Soft and nontender. No distention.  No CVA tenderness. Genitourinary: deferred Musculoskeletal: No lower extremity tenderness nor edema.  Warm and well perfused , 2+ distal pulses in both lower legs. No significant calf tenderness Neurologic:  Normal speech and  language. No weakness no numbness Skin:  Skin is warm, dry and intact. No rash noted. Psychiatric: Mood and affect are normal. Speech and behavior are normal.  ____________________________________________   LABS (all labs ordered are listed, but only abnormal results are displayed)  Labs Reviewed  COMPREHENSIVE METABOLIC PANEL - Abnormal; Notable for the following:       Result Value   Potassium 3.4 (*)    Glucose, Bld 110 (*)    Calcium 8.6 (*)    All other components within normal limits  CBC - Abnormal; Notable for the following:    RDW 15.6 (*)    All other components within normal limits  CK  ABO/RH   ____________________________________________  EKG  None ____________________________________________  RADIOLOGY  Ultrasound unremarkable ____________________________________________   PROCEDURES  Procedure(s) performed: No    Critical Care performed: No ____________________________________________   INITIAL IMPRESSION / ASSESSMENT AND PLAN / ED COURSE  Pertinent labs & imaging results that were available during my care of the patient were reviewed by me and considered in my medical decision making (see chart for details).  Patient well-appearing and in no distress. Legs are warm and well-perfused. Labs normal, ck normal. [place] normal. Suspect myalgias/ muscle pain. Recommend supportive care, return precautions discussed with patient  ____________________________________________   FINAL CLINICAL IMPRESSION(S) / ED DIAGNOSES  Final diagnoses:  Pain in both lower extremities      NEW MEDICATIONS STARTED DURING THIS VISIT:  New Prescriptions   No medications on file     Note:  This document was prepared using Dragon voice recognition software and may include unintentional dictation errors.    Lavonia Drafts, MD 06/25/16 1525

## 2016-06-25 NOTE — ED Notes (Signed)
Pt returned from [place] at this time. NAD noted. Pt c/o pain to BLE at this time.

## 2016-08-14 ENCOUNTER — Emergency Department
Admission: EM | Admit: 2016-08-14 | Discharge: 2016-08-14 | Disposition: A | Discharge status: Home / Self Care | Payer: No Typology Code available for payment source | Attending: Emergency Medicine | Admitting: Emergency Medicine

## 2016-08-14 DIAGNOSIS — Y990 Civilian activity done for income or pay: Secondary | ICD-10-CM | POA: Insufficient documentation

## 2016-08-14 DIAGNOSIS — X17XXXA Contact with hot engines, machinery and tools, initial encounter: Secondary | ICD-10-CM | POA: Insufficient documentation

## 2016-08-14 DIAGNOSIS — Z23 Encounter for immunization: Secondary | ICD-10-CM | POA: Insufficient documentation

## 2016-08-14 DIAGNOSIS — Y929 Unspecified place or not applicable: Secondary | ICD-10-CM | POA: Insufficient documentation

## 2016-08-14 DIAGNOSIS — Y9389 Activity, other specified: Secondary | ICD-10-CM | POA: Insufficient documentation

## 2016-08-14 DIAGNOSIS — T23232A Burn of second degree of multiple left fingers (nail), not including thumb, initial encounter: Secondary | ICD-10-CM | POA: Insufficient documentation

## 2016-08-14 DIAGNOSIS — T300 Burn of unspecified body region, unspecified degree: Secondary | ICD-10-CM

## 2016-08-14 DIAGNOSIS — Z87891 Personal history of nicotine dependence: Secondary | ICD-10-CM | POA: Insufficient documentation

## 2016-08-14 DIAGNOSIS — J449 Chronic obstructive pulmonary disease, unspecified: Secondary | ICD-10-CM | POA: Insufficient documentation

## 2016-08-14 DIAGNOSIS — Z79899 Other long term (current) drug therapy: Secondary | ICD-10-CM | POA: Insufficient documentation

## 2016-08-14 MED ORDER — SILVER SULFADIAZINE 1 % EX CREA: TOPICAL_CREAM | CUTANEOUS | 1 refills | Status: DC

## 2016-08-14 MED ORDER — SILVER SULFADIAZINE 1 % EX CREA
TOPICAL_CREAM | Freq: Once | CUTANEOUS | Status: AC
  Administered 2016-08-14: 23:00:00 via TOPICAL
  Filled 2016-08-14: qty 85

## 2016-08-14 MED ORDER — TETANUS-DIPHTH-ACELL PERTUSSIS 5-2.5-18.5 LF-MCG/0.5 IM SUSP
0.5000 mL | Freq: Once | INTRAMUSCULAR | Status: AC
  Administered 2016-08-14: 0.5 mL via INTRAMUSCULAR
  Filled 2016-08-14: qty 0.5

## 2016-08-14 NOTE — ED Triage Notes (Signed)
Pt states that Monday at work she burned her left hand on a type of dryer, pt has 3 small blistered areas to the top of her left hand, pt states that the pain has cont and she wanted to make sure it wasn't infected

## 2016-08-14 NOTE — ED Notes (Signed)
Pt presents to ED with c/o burns on her hand from Monday. Pt states she burned her hand on a dryer, presents with burns to her 3rd and 4th fingers on her L hand. Some redness and mild swelling noted. Pt states she wants to make sure "it's not infected". Pt states she does not want to file as workers comp at this time.

## 2016-08-14 NOTE — ED Provider Notes (Signed)
[facility] Emergency Department Provider Note  ____________________________________________  Time seen: Approximately 11:30 PM  I have reviewed the triage vital signs and the nursing notes.   HISTORY  Chief Complaint Hand Burn    HPI Kara West is a 41 y.o. female presents to the emergency department with blisters on third and fourth fingers of left hand. Patient states that she burned herself with a machine that gets hotter than a hair dryer. The machine is used to put plastic down on the end of tables. Patient states the blisters are draining clear fluid. Patient denies any additional injuries. No fever, shortness of breath, chest pain, nausea, vomiting, abdominal pain.   Past Medical History:  Diagnosis Date  . Anemia   . Bronchitis   . COPD (chronic obstructive pulmonary disease) [facility])     Patient Active Problem List   Diagnosis Date Noted  . Moderate Persistent Asthma 02/18/2015  . Allergic rhinitis 02/18/2015  . COPD exacerbation ([place]) 12/01/2014    Past Surgical History:  Procedure Laterality Date  . CESAREAN SECTION    . DILATION AND CURETTAGE OF UTERUS    . TUBAL LIGATION      Prior to Admission medications   Medication Sig Start Date End Date Taking? Authorizing Provider  albuterol (PROVENTIL HFA;VENTOLIN HFA) 108 (90 Base) MCG/ACT inhaler Inhale 2 puffs into the lungs every 6 (six) hours as needed for wheezing or shortness of breath. 06/17/16   Johnn Hai, PA-C  albuterol-ipratropium (COMBIVENT) 18-103 MCG/ACT inhaler Inhale 2 puffs into the lungs 4 (four) times daily as needed for wheezing. 09/15/15 09/14/16  Victorino Dike, FNP  predniSONE (DELTASONE) 10 MG tablet Take 6 tablets  today, on day 2 take 5 tablets, day 3 take 4 tablets, day 4 take 3 tablets, day 5 take  2 tablets and 1 tablet the last day 06/17/16   Johnn Hai, PA-C  silver sulfADIAZINE (SILVADENE) 1 % cream Apply to affected area daily 08/14/16 08/14/17   Laban Emperor, PA-C  Spacer/Aero-Holding Chambers (AEROCHAMBER MV) inhaler Use as instructed Patient not taking: Reported on 06/30/2015 02/18/15   Juanito Doom, MD    Allergies Patient has no known allergies.  Family History  Problem Relation Age of Onset  . Hypertension Mother     Social History Social History  Substance Use Topics  . Smoking status: Former Smoker    Packs/day: 0.20    Years: 5.00    Types: Cigarettes    Quit date: 06/11/2014  . Smokeless tobacco: Never Used  . Alcohol use 0.0 oz/week     Comment: occ     Review of Systems  Constitutional: No fever/chills ENT: No upper respiratory complaints. Cardiovascular: No chest pain. Respiratory: No SOB. Gastrointestinal: No abdominal pain.  No nausea, no vomiting.  Neurological: Negative for headaches, numbness or tingling   ____________________________________________   PHYSICAL EXAM:  VITAL SIGNS: ED Triage Vitals  Enc Vitals Group     BP 08/14/16 2205 112/67     Pulse Rate 08/14/16 2205 79     Resp 08/14/16 2205 18     Temp 08/14/16 2205 98 F (36.7 C)     Temp Source 08/14/16 2205 Oral     SpO2 08/14/16 2205 100 %     Weight 08/14/16 2206 194 lb (88 kg)     Height 08/14/16 2206 5\' 9"  (1.753 m)     Head Circumference --      Peak Flow --  Pain Score 08/14/16 2206 9     Pain Loc --      Pain Edu? --      Excl. in [place]? --      Constitutional: Alert and oriented. Well appearing and in no acute distress. Eyes: Conjunctivae are normal. PERRL. EOMI. Head: Atraumatic. ENT:      Ears:      Nose: No congestion/rhinnorhea.      Mouth/Throat: Mucous membranes are moist.  Neck: No stridor. Cardiovascular: Normal rate, regular rhythm.  Good peripheral circulation. Respiratory: Normal respiratory effort without tachypnea or retractions. Lungs CTAB. Good air entry to the bases with no decreased or absent breath sounds. Musculoskeletal: Full range of motion to all extremities. No gross  deformities appreciated. Neurologic:  Normal speech and language. No gross focal neurologic deficits are appreciated.  Skin:  Skin is warm, dry. 3 blisters on third and fourth fingers of left hand. No drainage noted. Mild swelling surrounding blisters.    ____________________________________________   LABS (all labs ordered are listed, but only abnormal results are displayed)  Labs Reviewed - No data to display ____________________________________________  EKG   ____________________________________________  RADIOLOGY   No results found.  ____________________________________________    PROCEDURES  Procedure(s) performed:    Procedures    Medications  Tdap (BOOSTRIX) injection 0.5 mL (0.5 mLs Intramuscular Given 08/14/16 2242)  silver sulfADIAZINE (SILVADENE) 1 % cream ( Topical Given 08/14/16 2256)     ____________________________________________   INITIAL IMPRESSION / ASSESSMENT AND PLAN / ED COURSE  Pertinent labs & imaging results that were available during my care of the patient were reviewed by me and considered in my medical decision making (see chart for details).  Review of the  [place] was performed in accordance of the [place] prior to dispensing any controlled drugs.     Patient's diagnosis is consistent with 2nd degree burn. Vital signs and exam are reassuring. Tetanus shot was updated. Silvadene was applied to blisters and fingers were wrapped. Education was provided and all questions were answered. Patient will be discharged home with prescriptions for Silvadene. Patient is to follow up with PCP as directed. Patient is given ED precautions to return to the ED for any worsening or new symptoms.     ____________________________________________  FINAL CLINICAL IMPRESSION(S) / ED DIAGNOSES  Final diagnoses:  Burn      NEW MEDICATIONS STARTED DURING THIS VISIT:  Discharge Medication List as of 08/14/2016 10:46 PM    START taking these  medications   Details  silver sulfADIAZINE (SILVADENE) 1 % cream Apply to affected area daily, Print            This chart was dictated using voice recognition software/Dragon. Despite best efforts to proofread, errors can occur which can change the meaning. Any change was purely unintentional.    Laban Emperor, PA-C 08/14/16 [place], MD 08/15/16 1455

## 2016-09-06 ENCOUNTER — Emergency Department: Payer: No Typology Code available for payment source

## 2016-09-06 ENCOUNTER — Emergency Department
Admission: EM | Admit: 2016-09-06 | Discharge: 2016-09-06 | Disposition: A | Discharge status: Home / Self Care | Payer: No Typology Code available for payment source | Attending: Emergency Medicine | Admitting: Emergency Medicine

## 2016-09-06 DIAGNOSIS — B9789 Other viral agents as the cause of diseases classified elsewhere: Secondary | ICD-10-CM

## 2016-09-06 DIAGNOSIS — Z79899 Other long term (current) drug therapy: Secondary | ICD-10-CM | POA: Insufficient documentation

## 2016-09-06 DIAGNOSIS — Z87891 Personal history of nicotine dependence: Secondary | ICD-10-CM | POA: Insufficient documentation

## 2016-09-06 DIAGNOSIS — J441 Chronic obstructive pulmonary disease with (acute) exacerbation: Secondary | ICD-10-CM | POA: Insufficient documentation

## 2016-09-06 DIAGNOSIS — J069 Acute upper respiratory infection, unspecified: Secondary | ICD-10-CM

## 2016-09-06 DIAGNOSIS — J4520 Mild intermittent asthma, uncomplicated: Secondary | ICD-10-CM | POA: Insufficient documentation

## 2016-09-06 MED ORDER — PREDNISONE 20 MG PO TABS: ORAL_TABLET | ORAL | 0 refills | Status: DC

## 2016-09-06 MED ORDER — PREDNISONE 20 MG PO TABS
60.0000 mg | ORAL_TABLET | Freq: Once | ORAL | Status: AC
  Administered 2016-09-06: 60 mg via ORAL
  Filled 2016-09-06: qty 3

## 2016-09-06 MED ORDER — HYDROCOD POLST-CPM POLST ER 10-8 MG/5ML PO SUER
5.0000 mL | Freq: Once | ORAL | Status: AC
  Administered 2016-09-06: 5 mL via ORAL
  Filled 2016-09-06: qty 5

## 2016-09-06 MED ORDER — HYDROCOD POLST-CPM POLST ER 10-8 MG/5ML PO SUER
5.0000 mL | Freq: Two times a day (BID) | ORAL | 0 refills | Status: DC
Start: 2016-09-06 — End: 2017-02-14

## 2016-09-06 NOTE — Discharge Instructions (Signed)
1. Finish prednisone 60 mg daily. Start your next dose Wednesday. 2. You may take Tussionex as needed for cough. 3. Return to the ER for worsening symptoms, persistent vomiting, difficulty breathing or other concerns.

## 2016-09-06 NOTE — ED Notes (Signed)

## 2016-09-06 NOTE — ED Provider Notes (Signed)
[facility] Emergency Department Provider Note   ____________________________________________   First MD Initiated Contact with Patient 09/06/16 0451     (approximate)  I have reviewed the triage vital signs and the nursing notes.   HISTORY  Chief Complaint Cough    HPI Kara West is a 41 y.o. female who presents to the ED from home with a chief complaint of nonproductive cough and congestion. Patient with a history of asthma, never hospitalized, who complains of nasal congestion and nonproductive cough for one week. Denies associated fever, chills, chest pain, shortness of breath, abdominal pain, nausea, vomiting, diarrhea. She did use her nebulizer prior to arrival for wheezing. Denies recent travel or trauma. Nebulizer partially relieved her symptoms.   Past Medical History:  Diagnosis Date  . Anemia   . Bronchitis   . COPD (chronic obstructive pulmonary disease) [facility])     Patient Active Problem List   Diagnosis Date Noted  . Moderate Persistent Asthma 02/18/2015  . Allergic rhinitis 02/18/2015  . COPD exacerbation ([place]) 12/01/2014    Past Surgical History:  Procedure Laterality Date  . CESAREAN SECTION    . DILATION AND CURETTAGE OF UTERUS    . TUBAL LIGATION      Prior to Admission medications   Medication Sig Start Date End Date Taking? Authorizing Provider  albuterol (PROVENTIL HFA;VENTOLIN HFA) 108 (90 Base) MCG/ACT inhaler Inhale 2 puffs into the lungs every 6 (six) hours as needed for wheezing or shortness of breath. 06/17/16   Johnn Hai, PA-C  albuterol-ipratropium (COMBIVENT) 18-103 MCG/ACT inhaler Inhale 2 puffs into the lungs 4 (four) times daily as needed for wheezing. 09/15/15 09/14/16  Victorino Dike, FNP  chlorpheniramine-HYDROcodone (TUSSIONEX PENNKINETIC ER) 10-8 MG/5ML SUER Take 5 mLs by mouth 2 (two) times daily. 09/06/16   Paulette Blanch, MD  predniSONE (DELTASONE) 20 MG tablet 3 tablets daily x 4 days  09/06/16   Paulette Blanch, MD  silver sulfADIAZINE (SILVADENE) 1 % cream Apply to affected area daily 08/14/16 08/14/17  Laban Emperor, PA-C  Spacer/Aero-Holding Chambers (AEROCHAMBER MV) inhaler Use as instructed Patient not taking: Reported on 06/30/2015 02/18/15   Juanito Doom, MD    Allergies Patient has no known allergies.  Family History  Problem Relation Age of Onset  . Hypertension Mother     Social History Social History  Substance Use Topics  . Smoking status: Former Smoker    Packs/day: 0.20    Years: 5.00    Types: Cigarettes    Quit date: 06/11/2014  . Smokeless tobacco: Never Used  . Alcohol use 0.0 oz/week     Comment: occ    Review of Systems  Constitutional: No fever/chills. Eyes: No visual changes. ENT: Positive for nasal congestion.No sore throat. Cardiovascular: Denies chest pain. Respiratory: Positive for nonproductive cough. Denies shortness of breath. Gastrointestinal: No abdominal pain.  No nausea, no vomiting.  No diarrhea.  No constipation. Genitourinary: Negative for dysuria. Musculoskeletal: Negative for back pain. Skin: Negative for rash. Neurological: Negative for headaches, focal weakness or numbness.  10-point ROS otherwise negative.  ____________________________________________   PHYSICAL EXAM:  VITAL SIGNS: ED Triage Vitals  Enc Vitals Group     BP 09/06/16 0110 108/61     Pulse Rate 09/06/16 0109 74     Resp 09/06/16 0109 18     Temp 09/06/16 0109 98.1 F (36.7 C)     Temp Source 09/06/16 0109 Oral     SpO2 09/06/16 0109 97 %  Weight 09/06/16 0109 205 lb (93 kg)     Height 09/06/16 0109 5\' 9"  (1.753 m)     Head Circumference --      Peak Flow --      Pain Score 09/06/16 0110 7     Pain Loc --      Pain Edu? --      Excl. in [place]? --     Constitutional: Alert and oriented. Well appearing and in no acute distress. Eyes: Conjunctivae are normal. PERRL. EOMI. Head: Atraumatic. Nose:  Congestion/rhinnorhea. Mouth/Throat: Mucous membranes are moist.  Oropharynx non-erythematous. Neck: No stridor.   Cardiovascular: Normal rate, regular rhythm. Grossly normal heart sounds.  Good peripheral circulation. Respiratory: Normal respiratory effort.  No retractions. Lungs CTAB. Active dry cough. Gastrointestinal: Soft and nontender. No distention. No abdominal bruits. No CVA tenderness. Musculoskeletal: No lower extremity tenderness nor edema.  No joint effusions. Neurologic:  Normal speech and language. No gross focal neurologic deficits are appreciated. No gait instability. Skin:  Skin is warm, dry and intact. No rash noted. Psychiatric: Mood and affect are normal. Speech and behavior are normal.  ____________________________________________   LABS (all labs ordered are listed, but only abnormal results are displayed)  Labs Reviewed - No data to display ____________________________________________  EKG  none ____________________________________________  RADIOLOGY  Chest x-ray interpreted per Dr. Quintella Reichert: No active cardiopulmonary disease. ____________________________________________   PROCEDURES  Procedure(s) performed: None  Procedures  Critical Care performed: No  ____________________________________________   INITIAL IMPRESSION / ASSESSMENT AND PLAN / ED COURSE  Pertinent labs & imaging results that were available during my care of the patient were reviewed by me and considered in my medical decision making (see chart for details).  41 year old female with a history of asthma who presents with viral URI with wheezing heard at home. Will place on 5 day burst of prednisone, Tussionex as needed for cough, and patient will follow-up closely with her PCP this week. Strict return precautions given. Patient verbalizes understanding and agrees with plan of care.      ____________________________________________   FINAL CLINICAL IMPRESSION(S) / ED  DIAGNOSES  Final diagnoses:  Viral URI with cough  Mild intermittent asthma without complication      NEW MEDICATIONS STARTED DURING THIS VISIT:  New Prescriptions   CHLORPHENIRAMINE-HYDROCODONE (TUSSIONEX PENNKINETIC ER) 10-8 MG/5ML SUER    Take 5 mLs by mouth 2 (two) times daily.   PREDNISONE (DELTASONE) 20 MG TABLET    3 tablets daily x 4 days     Note:  This document was prepared using Dragon voice recognition software and may include unintentional dictation errors.    Paulette Blanch, MD 09/06/16 [phone]

## 2016-09-06 NOTE — ED Triage Notes (Signed)
Pt in with co cough and congestion for over a week, has hx of bronchitis.

## 2016-10-18 IMAGING — CR DG CHEST 2V
2 series · 2 of 2 positions shown · non-contrast
Comparison: 06/17/2015

CLINICAL DATA: Productive cough, shortness of breath and chest
pain.

EXAM:
CHEST - 2 VIEW

[w chest pa]
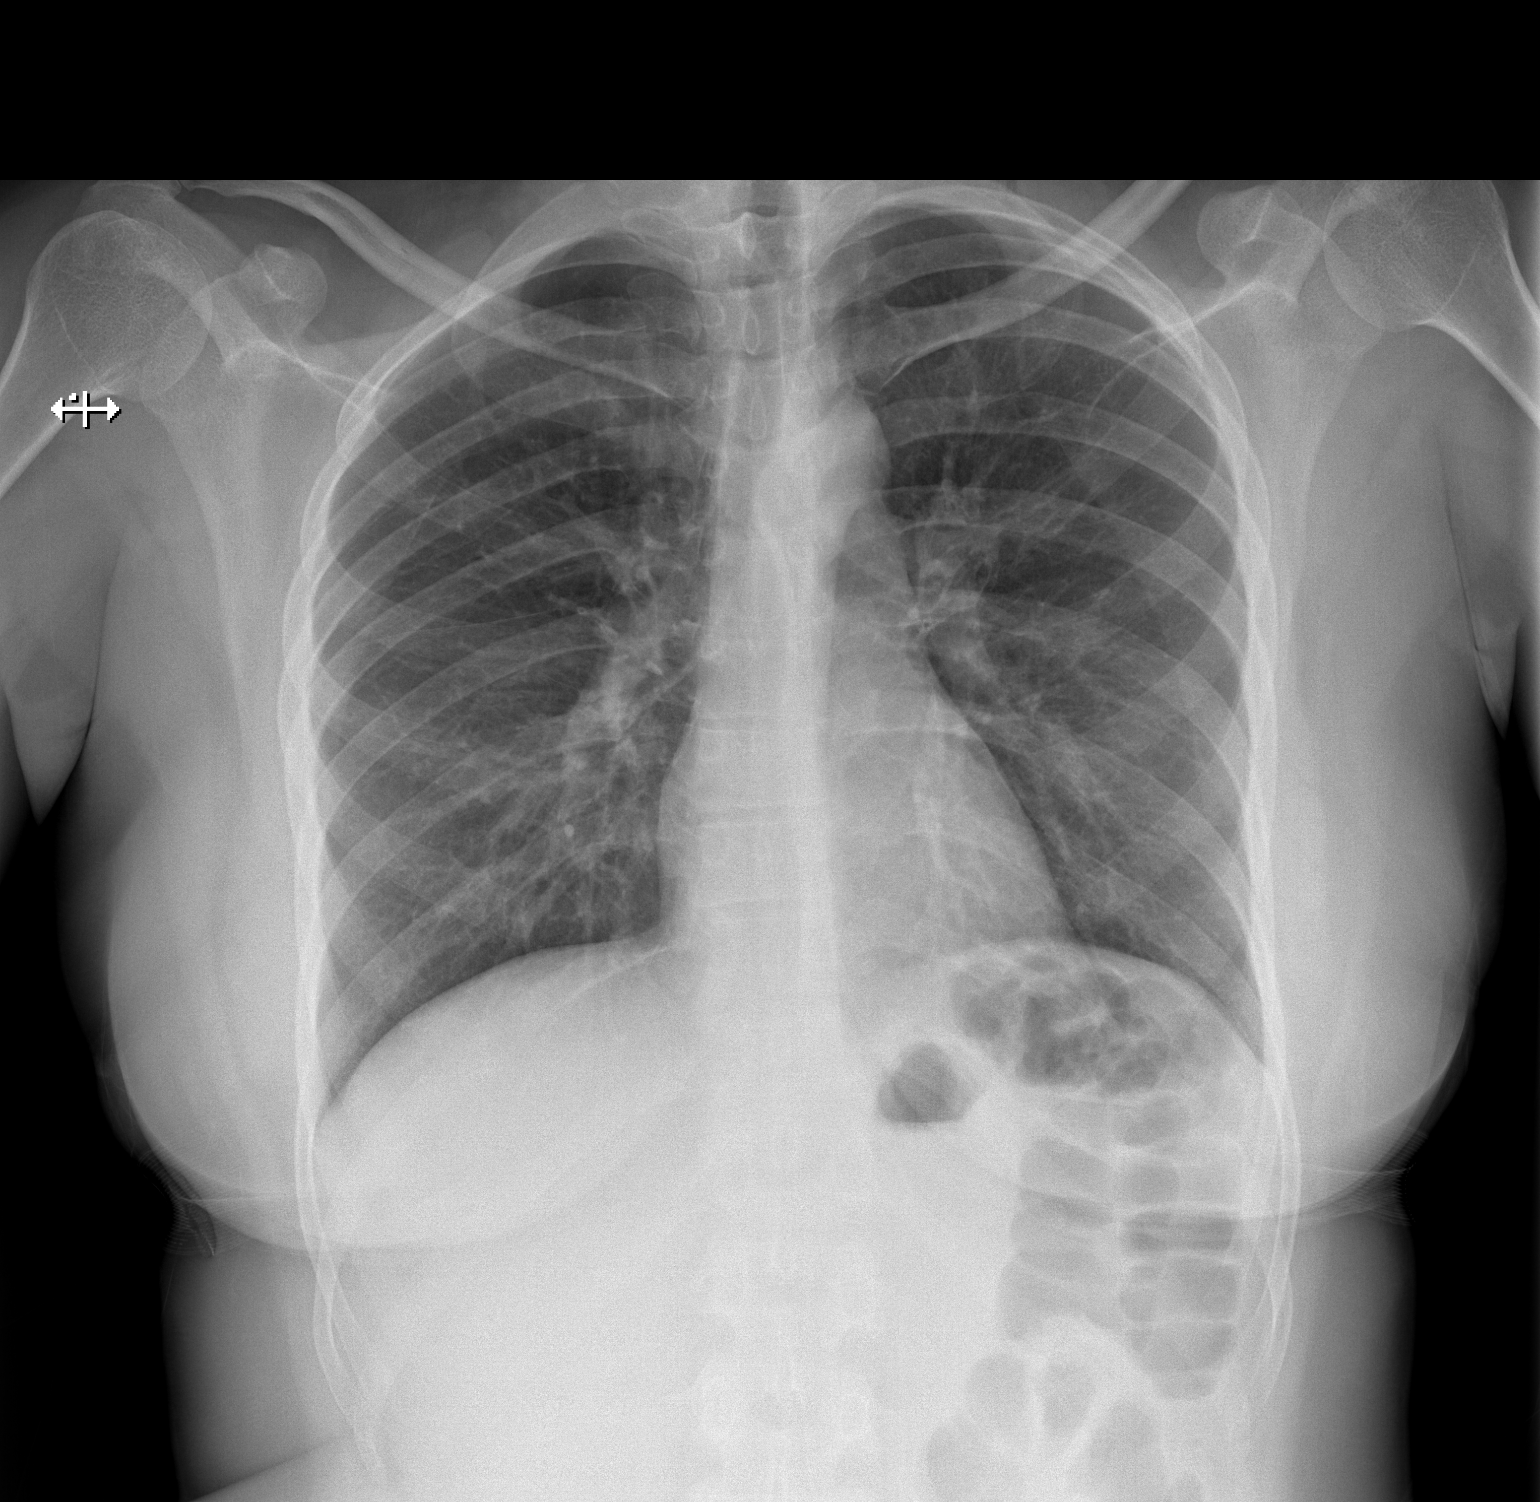

[w chest lat]
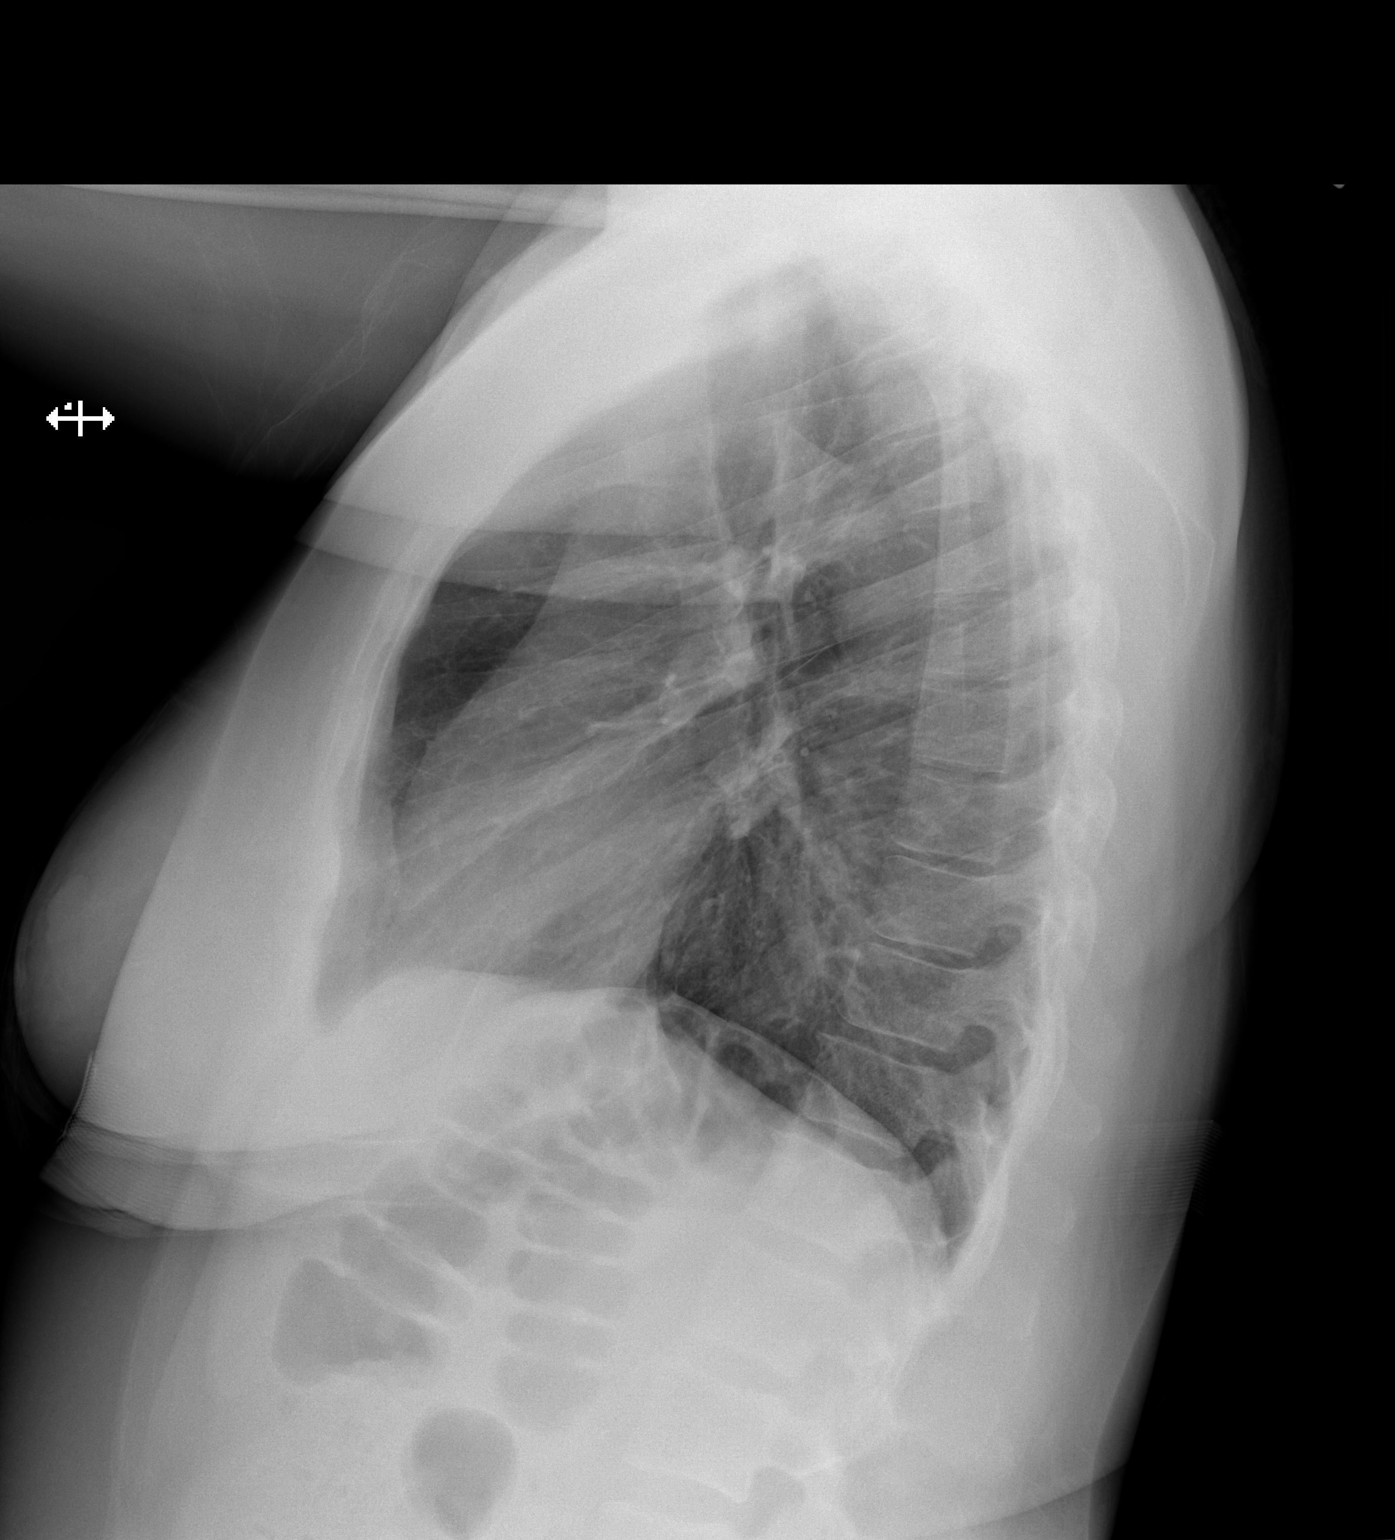

[2 of 2 positions shown; findings below may reference images not displayed]

FINDINGS: The heart size and mediastinal contours are within normal limits.
There is no evidence of pulmonary edema, consolidation,
pneumothorax, nodule or pleural fluid. The visualized skeletal
structures are unremarkable.
IMPRESSION: No active disease.

## 2016-12-04 ENCOUNTER — Emergency Department
Admission: EM | Admit: 2016-12-04 | Discharge: 2016-12-05 | Disposition: A | Discharge status: Home / Self Care | Payer: Self-pay | Attending: Emergency Medicine | Admitting: Emergency Medicine

## 2016-12-04 ENCOUNTER — Emergency Department: Payer: Self-pay

## 2016-12-04 DIAGNOSIS — N12 Tubulo-interstitial nephritis, not specified as acute or chronic: Secondary | ICD-10-CM | POA: Insufficient documentation

## 2016-12-04 DIAGNOSIS — Z79899 Other long term (current) drug therapy: Secondary | ICD-10-CM | POA: Insufficient documentation

## 2016-12-04 DIAGNOSIS — J4540 Moderate persistent asthma, uncomplicated: Secondary | ICD-10-CM | POA: Insufficient documentation

## 2016-12-04 DIAGNOSIS — J449 Chronic obstructive pulmonary disease, unspecified: Secondary | ICD-10-CM | POA: Insufficient documentation

## 2016-12-04 DIAGNOSIS — Z87891 Personal history of nicotine dependence: Secondary | ICD-10-CM | POA: Insufficient documentation

## 2016-12-04 LAB — URINALYSIS, COMPLETE (UACMP) WITH MICROSCOPIC
Bilirubin Urine: NEGATIVE
Glucose, UA: NEGATIVE mg/dL
HGB URINE DIPSTICK: NEGATIVE
Ketones, ur: NEGATIVE mg/dL
NITRITE: POSITIVE — AB
PROTEIN: NEGATIVE mg/dL
SPECIFIC GRAVITY, URINE: 1.026 (ref 1.005–1.030)
pH: 5 (ref 5.0–8.0)

## 2016-12-04 MED ORDER — KETOROLAC TROMETHAMINE 60 MG/2ML IM SOLN
30.0000 mg | Freq: Once | INTRAMUSCULAR | Status: AC
  Administered 2016-12-04: 30 mg via INTRAMUSCULAR
  Filled 2016-12-04: qty 2

## 2016-12-04 MED ORDER — ORPHENADRINE CITRATE 30 MG/ML IJ SOLN
60.0000 mg | Freq: Two times a day (BID) | INTRAMUSCULAR | Status: DC
  Administered 2016-12-04: 60 mg via INTRAMUSCULAR
  Filled 2016-12-04: qty 2

## 2016-12-04 NOTE — ED Triage Notes (Signed)
Pt reports to ED w/ c/o back pain x 1 week. Pt sts that back pain occurred after she was bending down and then stood up. Denies urinary s/s. Speaks w/o issue, MAE.

## 2016-12-04 NOTE — ED Provider Notes (Signed)
[facility] Emergency Department Provider Note  ____________________________________________  Time seen: Approximately 10:57 PM  I have reviewed the triage vital signs and the nursing notes.   HISTORY  Chief Complaint Back Pain    HPI Brent Noto is a 41 y.o. female that presents to the emergency department with right-sided back pain for one week. Patient states that she was bending over and when she realized she heard a pop and immediately started having pain on the right side. It is worse with movement. She denies dysuria but states that she has had some frequency of urination. She has not had a UTI in several years. No history of kidney stone. She has taken Aleve and Goody powders for pain. She denies fever, shortness breath, chest pain, nausea, vomiting, abdominal pain, bowel or bladder dysfunction, saddle paresthesias, leg pain, numbness, tingling.   Past Medical History:  Diagnosis Date  . Anemia   . Bronchitis   . COPD (chronic obstructive pulmonary disease) [facility])     Patient Active Problem List   Diagnosis Date Noted  . Moderate Persistent Asthma 02/18/2015  . Allergic rhinitis 02/18/2015  . COPD exacerbation ([place]) 12/01/2014    Past Surgical History:  Procedure Laterality Date  . CESAREAN SECTION    . DILATION AND CURETTAGE OF UTERUS    . TUBAL LIGATION      Prior to Admission medications   Medication Sig Start Date End Date Taking? Authorizing Provider  albuterol (PROVENTIL HFA;VENTOLIN HFA) 108 (90 Base) MCG/ACT inhaler Inhale 2 puffs into the lungs every 6 (six) hours as needed for wheezing or shortness of breath. 06/17/16   Johnn Hai, PA-C  albuterol-ipratropium (COMBIVENT) 18-103 MCG/ACT inhaler Inhale 2 puffs into the lungs 4 (four) times daily as needed for wheezing. 09/15/15 09/14/16  Sherrie George B, FNP  chlorpheniramine-HYDROcodone (TUSSIONEX PENNKINETIC ER) 10-8 MG/5ML SUER Take 5 mLs by mouth 2 (two) times daily.  09/06/16   Paulette Blanch, MD  levofloxacin (LEVAQUIN) 500 MG tablet Take 1 tablet (500 mg total) by mouth daily. 12/05/16 12/15/16  Laban Emperor, PA-C  predniSONE (DELTASONE) 20 MG tablet 3 tablets daily x 4 days 09/06/16   Paulette Blanch, MD  silver sulfADIAZINE (SILVADENE) 1 % cream Apply to affected area daily 08/14/16 08/14/17  Laban Emperor, PA-C  Spacer/Aero-Holding Chambers (AEROCHAMBER MV) inhaler Use as instructed Patient not taking: Reported on 06/30/2015 02/18/15   Juanito Doom, MD    Allergies Patient has no known allergies.  Family History  Problem Relation Age of Onset  . Hypertension Mother     Social History Social History  Substance Use Topics  . Smoking status: Former Smoker    Packs/day: 0.20    Years: 5.00    Types: Cigarettes    Quit date: 06/11/2014  . Smokeless tobacco: Never Used  . Alcohol use 0.0 oz/week     Comment: occ     Review of Systems  Constitutional: No fever/chills Cardiovascular: No chest pain. Respiratory: No SOB. Gastrointestinal: No abdominal pain.  No nausea, no vomiting.  Musculoskeletal: Positive for back pain. Skin: Negative for rash, abrasions, lacerations, ecchymosis. Neurological: Negative for headaches, numbness or tingling   ____________________________________________   PHYSICAL EXAM:  VITAL SIGNS: ED Triage Vitals  Enc Vitals Group     BP 12/04/16 2117 105/67     Pulse Rate 12/04/16 2117 72     Resp 12/04/16 2117 18     Temp 12/04/16 2117 97.6 F (36.4 C)  Temp Source 12/04/16 2117 Oral     SpO2 12/04/16 2117 99 %     Weight 12/04/16 2116 205 lb (93 kg)     Height 12/04/16 2116 5\' 9"  (1.753 m)     Head Circumference --      Peak Flow --      Pain Score 12/04/16 2116 10     Pain Loc --      Pain Edu? --      Excl. in [place]? --      Constitutional: Alert and oriented. Well appearing and in no acute distress. Eyes: Conjunctivae are normal. PERRL. EOMI. Head: Atraumatic. ENT:      Ears:      Nose: No  congestion/rhinnorhea.      Mouth/Throat: Mucous membranes are moist.  Neck: No stridor.   Cardiovascular: Normal rate, regular rhythm.  Good peripheral circulation. Respiratory: Normal respiratory effort without tachypnea or retractions. Lungs CTAB. Good air entry to the bases with no decreased or absent breath sounds. Gastrointestinal: Bowel sounds 4 quadrants. Soft and nontender to palpation. No guarding or rigidity. No palpable masses. No distention. Musculoskeletal: Full range of motion to all extremities. No gross deformities appreciated. Full range of motion of back. Right CVA tenderness. Neurologic:  Normal speech and language. No gross focal neurologic deficits are appreciated.  Skin:  Skin is warm, dry and intact. No rash noted.   ____________________________________________   LABS (all labs ordered are listed, but only abnormal results are displayed)  Labs Reviewed  URINALYSIS, COMPLETE (UACMP) WITH MICROSCOPIC - Abnormal; Notable for the following:       Result Value   Color, Urine YELLOW (*)    APPearance CLEAR (*)    Nitrite POSITIVE (*)    Leukocytes, UA TRACE (*)    Bacteria, UA MANY (*)    Squamous Epithelial / LPF 0-5 (*)    All other components within normal limits   ____________________________________________  EKG   ____________________________________________  RADIOLOGY  Ct Renal Stone Study  Result Date: 12/05/2016 CLINICAL DATA:  Back pain.  Urinary tract infection. EXAM: CT ABDOMEN AND PELVIS WITHOUT CONTRAST TECHNIQUE: Multidetector CT imaging of the abdomen and pelvis was performed following the standard protocol without IV contrast. COMPARISON:  CT abdomen pelvis 03/18/2014 FINDINGS: Lower chest: No pulmonary nodules. No visible pleural or pericardial effusion. Hepatobiliary: Normal hepatic size and contours. No perihepatic ascites. No intra- or extrahepatic biliary dilatation. Normal gallbladder. Pancreas: Normal pancreatic contours. No  peripancreatic fluid collection or pancreatic ductal dilatation. Spleen: Normal. Adrenals/Urinary Tract: Normal adrenal glands. No hydronephrosis or nephrolithiasis. No abnormal perinephric stranding. No ureteral obstruction. Stomach/Bowel: There is no hiatal hernia. The stomach and duodenum are normal. There is no dilated small bowel or enteric inflammation. There is no colonic abnormality. The appendix is normal. Vascular/Lymphatic: Normal course and caliber of the major abdominal vessels. No abdominal or pelvic adenopathy. Reproductive: Retroverted uterus.  No adnexal mass. Musculoskeletal: T12 hemangioma. No evidence of traumatic disc herniation. Vertebral body heights and intervertebral disc spaces are maintained. Normal visualized extrathoracic and extraperitoneal soft tissues. Other: No contributory non-categorized findings. IMPRESSION: 1. No CT finding to account for the reported back pain. 2. No acute abnormality of the abdomen or pelvis. Electronically Signed   By: Ulyses Jarred M.D.   On: 12/05/2016 00:41    ____________________________________________    PROCEDURES  Procedure(s) performed:    Procedures    Medications  orphenadrine (NORFLEX) injection 60 mg (60 mg Intramuscular Given 12/04/16 2247)  levofloxacin (LEVAQUIN) tablet 500  mg (not administered)  ketorolac (TORADOL) injection 30 mg (30 mg Intramuscular Given 12/04/16 2247)     ____________________________________________   INITIAL IMPRESSION / ASSESSMENT AND PLAN / ED COURSE  Pertinent labs & imaging results that were available during my care of the patient were reviewed by me and considered in my medical decision making (see chart for details).  Review of the [place] CSRS was performed in accordance of the [place] prior to dispensing any controlled drugs.   Patient's diagnosis is consistent with pyelonephritis. Vital signs and exam are reassuring. She is afebrile. No abdominal pain, nausea, vomiting. Urinalysis  consistent with infection. CT renal stone negative for kidney stone or hydronephrosis. Patient will be discharged home with prescriptions for Levaquin. Patient is to follow up with PCP as directed. Patient is given ED precautions to return to the ED for any worsening or new symptoms.     ____________________________________________  FINAL CLINICAL IMPRESSION(S) / ED DIAGNOSES  Final diagnoses:  Pyelonephritis      NEW MEDICATIONS STARTED DURING THIS VISIT:  New Prescriptions   LEVOFLOXACIN (LEVAQUIN) 500 MG TABLET    Take 1 tablet (500 mg total) by mouth daily.        This chart was dictated using voice recognition software/Dragon. Despite best efforts to proofread, errors can occur which can change the meaning. Any change was purely unintentional.    Laban Emperor, PA-C 12/05/16 [place], Randall An, MD 12/05/16 [phone]

## 2016-12-05 MED ORDER — LEVOFLOXACIN 500 MG PO TABS
500.0000 mg | ORAL_TABLET | Freq: Once | ORAL | Status: DC
  Filled 2016-12-05: qty 1

## 2016-12-05 MED ORDER — LEVOFLOXACIN 500 MG PO TABS: 500.0000 mg | ORAL_TABLET | Freq: Every day | ORAL | 0 refills | Status: AC

## 2017-02-14 ENCOUNTER — Ambulatory Visit
Admission: RE | Admit: 2017-02-14 | Discharge: 2017-02-14 | Disposition: A | Discharge status: Home / Self Care | Payer: No Typology Code available for payment source | Source: Ambulatory Visit | Attending: Family Medicine | Admitting: Family Medicine

## 2017-02-14 ENCOUNTER — Ambulatory Visit (INDEPENDENT_AMBULATORY_CARE_PROVIDER_SITE_OTHER): Payer: Self-pay | Admitting: Family Medicine

## 2017-02-14 ENCOUNTER — Encounter: Payer: Self-pay | Admitting: Family Medicine

## 2017-02-14 VITALS — BP 112/71 | HR 97 | Temp 98.0°F | Resp 16 | Ht 69.0 in | Wt 219.2 lb

## 2017-02-14 DIAGNOSIS — R202 Paresthesia of skin: Secondary | ICD-10-CM | POA: Diagnosis present

## 2017-02-14 DIAGNOSIS — M19071 Primary osteoarthritis, right ankle and foot: Secondary | ICD-10-CM | POA: Diagnosis not present

## 2017-02-14 DIAGNOSIS — M19072 Primary osteoarthritis, left ankle and foot: Secondary | ICD-10-CM | POA: Diagnosis not present

## 2017-02-14 LAB — CBC WITH DIFFERENTIAL/PLATELET
BASOS ABS: 0 {cells}/uL (ref 0–200)
Basophils Relative: 0 %
EOS PCT: 2 %
Eosinophils Absolute: 92 cells/uL (ref 15–500)
HEMATOCRIT: 40.9 % (ref 35.0–45.0)
HEMOGLOBIN: 12.8 g/dL (ref 11.7–15.5)
LYMPHS ABS: 2116 {cells}/uL (ref 850–3900)
Lymphocytes Relative: 46 %
MCH: 28.1 pg (ref 27.0–33.0)
MCHC: 31.3 g/dL — AB (ref 32.0–36.0)
MCV: 89.7 fL (ref 80.0–100.0)
MONO ABS: 276 {cells}/uL (ref 200–950)
MPV: 11.2 fL (ref 7.5–12.5)
Monocytes Relative: 6 %
NEUTROS ABS: 2116 {cells}/uL (ref 1500–7800)
NEUTROS PCT: 46 %
Platelets: 228 10*3/uL (ref 140–400)
RBC: 4.56 MIL/uL (ref 3.80–5.10)
RDW: 14.9 % (ref 11.0–15.0)
WBC: 4.6 10*3/uL (ref 3.8–10.8)

## 2017-02-14 LAB — POCT GLYCOSYLATED HEMOGLOBIN (HGB A1C): Hemoglobin A1C: 5

## 2017-02-14 NOTE — Progress Notes (Signed)
Name: Kara West   MRN: [id number]    DOB: 1976/02/18   Date:02/14/2017       Progress Note  Subjective  Chief Complaint  Chief Complaint  Patient presents with  . Foot Problem    pt complains of bilateral burning and tingling in feet x2 months, patient was seen at [facility] and was prescribed Gabapentin but has no relief     HPI  Patient has a 2 month history of progressively worsening burning and tingling in both feet, sensation present over the entire feet, weight bearing and walking makes it worse, nothing seems to make it better. She describes the sensation as her feet being on fire and experiencing pins and needles sensation. She has been seen at [facility] where she was tested for diabetes and given a prescription for Gabapentin 300 mg daily, which is not helping her symptoms at all.  She denies any back pain, works in [employer] and is on her feet for at least 8 hours daily.    Past Medical History:  Diagnosis Date  . Anemia   . Bronchitis   . COPD (chronic obstructive pulmonary disease) ([place])     Past Surgical History:  Procedure Laterality Date  . CESAREAN SECTION    . DILATION AND CURETTAGE OF UTERUS    . TUBAL LIGATION      Family History  Problem Relation Age of Onset  . Hypertension Mother     Social History   Social History  . Marital status: Legally Separated    Spouse name: N/A  . Number of children: N/A  . Years of education: N/A   Occupational History  . Not on file.   Social History Main Topics  . Smoking status: Former Smoker    Packs/day: 0.20    Years: 5.00    Types: Cigarettes    Quit date: 06/11/2014  . Smokeless tobacco: Never Used  . Alcohol use 0.0 oz/week     Comment: occ  . Drug use: No  . Sexual activity: Yes   Other Topics Concern  . Not on file   Social History Narrative  . No narrative on file     Current Outpatient Prescriptions:  .  albuterol (PROVENTIL HFA;VENTOLIN HFA) 108 (90  Base) MCG/ACT inhaler, Inhale 2 puffs into the lungs every 6 (six) hours as needed for wheezing or shortness of breath., Disp: 1 Inhaler, Rfl: 2 .  Spacer/Aero-Holding Chambers (AEROCHAMBER MV) inhaler, Use as instructed, Disp: 1 each, Rfl: 0 .  albuterol-ipratropium (COMBIVENT) 18-103 MCG/ACT inhaler, Inhale 2 puffs into the lungs 4 (four) times daily as needed for wheezing., Disp: 1 Inhaler, Rfl: 2  No Known Allergies   Review of Systems  Constitutional: Negative for chills, fever and malaise/fatigue.  Respiratory: Negative for cough, shortness of breath and wheezing.   Cardiovascular: Negative for chest pain.  Gastrointestinal: Negative for abdominal pain.  Psychiatric/Behavioral: Negative for depression. The patient is not nervous/anxious.       Objective  Vitals:   02/14/17 1459  BP: 112/71  Pulse: 97  Resp: 16  Temp: 98 F (36.7 C)  TempSrc: Oral  SpO2: 97%  Weight: 219 lb 3.2 oz (99.4 kg)  Height: 5\' 9"  (1.753 m)    Physical Exam  Constitutional: She is oriented to person, place, and time and well-developed, well-nourished, and in no distress.  HENT:  Head: Normocephalic and atraumatic.  Cardiovascular: Normal rate, regular rhythm and normal heart sounds.   No murmur heard.  Pulmonary/Chest: Effort normal and breath sounds normal. She has no wheezes.  Abdominal: Soft. Bowel sounds are normal. There is no tenderness.  Musculoskeletal: She exhibits no edema.       Right foot: Normal. There is no tenderness, no swelling, normal capillary refill and no deformity.       Left foot: Normal. There is normal range of motion, no tenderness, no swelling, normal capillary refill, no crepitus and no deformity.  Neurological: She is alert and oriented to person, place, and time. She has normal sensation.  Psychiatric: Mood, memory, affect and judgment normal.  Nursing note and vitals reviewed.     Assessment & Plan  1. Paresthesia of both feet Unclear etiology, obtain  pertinent lab work to rule out vitamin deficiency, hypothyroidism, anemia and electrolyte disturbance, x-rays to rule out acute musculoskeletal etiology. Point-of-care A1c is 5.0 - CBC with Differential/Platelet - COMPLETE METABOLIC PANEL WITH GFR - TSH - VITAMIN D 25 Hydroxy (Vit-D Deficiency, Fractures) - B12 and Folate Panel - ANA - DG Foot Complete Left; Future - DG Foot Complete Right; Future - POCT glycosylated hemoglobin (Hb A1C)   Sabria Florido [facility] 02/14/2017 3:17 PM

## 2017-02-15 ENCOUNTER — Telehealth: Payer: Self-pay | Admitting: Family Medicine

## 2017-02-15 DIAGNOSIS — R202 Paresthesia of skin: Secondary | ICD-10-CM

## 2017-02-15 LAB — COMPLETE METABOLIC PANEL WITH GFR
ALBUMIN: 4.1 g/dL (ref 3.6–5.1)
ALK PHOS: 72 U/L (ref 33–115)
ALT: 17 U/L (ref 6–29)
AST: 16 U/L (ref 10–30)
BILIRUBIN TOTAL: 0.7 mg/dL (ref 0.2–1.2)
BUN: 8 mg/dL (ref 7–25)
CALCIUM: 9.6 mg/dL (ref 8.6–10.2)
CO2: 19 mmol/L — ABNORMAL LOW (ref 20–32)
Chloride: 106 mmol/L (ref 98–110)
Creat: 0.62 mg/dL (ref 0.50–1.10)
GFR, Est African American: >89 mL/min (ref >=60)
GFR, Est Non African American: >89 mL/min (ref >=60)
GLUCOSE: 81 mg/dL (ref 65–99)
Potassium: 3.5 mmol/L (ref 3.5–5.3)
SODIUM: 138 mmol/L (ref 135–146)
TOTAL PROTEIN: 6.6 g/dL (ref 6.1–8.1)

## 2017-02-15 LAB — ANTI-NUCLEAR AB-TITER (ANA TITER): ANA Titer 1: 1:40 {titer} — ABNORMAL HIGH

## 2017-02-15 LAB — B12 AND FOLATE PANEL
Folate: 8.2 ng/mL (ref >5.4)
Vitamin B-12: 544 pg/mL (ref 200–1100)

## 2017-02-15 LAB — TSH: TSH: 1.34 m[IU]/L

## 2017-02-15 LAB — VITAMIN D 25 HYDROXY (VIT D DEFICIENCY, FRACTURES): Vit D, 25-Hydroxy: 23 ng/mL — ABNORMAL LOW (ref 30–100)

## 2017-02-15 LAB — ANA: Anti Nuclear Antibody(ANA): POSITIVE — AB

## 2017-02-15 NOTE — Telephone Encounter (Signed)
Pt was seen on yesterday and states that you where going to call in a prescription for her foot. However she went to [facility] and they did not have anything.  [phone]

## 2017-02-15 NOTE — Telephone Encounter (Signed)
Dr. Manuella Ghazi, Pt was seen on yesterday and states that you where going to call in a prescription for her foot. However she went to [facility] and they did not have anything.  [phone]

## 2017-02-16 DIAGNOSIS — R202 Paresthesia of skin: Secondary | ICD-10-CM | POA: Insufficient documentation

## 2017-02-16 MED ORDER — GABAPENTIN 100 MG PO CAPS: 100.0000 mg | ORAL_CAPSULE | Freq: Three times a day (TID) | ORAL | 0 refills | Status: DC

## 2017-02-16 NOTE — Telephone Encounter (Signed)
Pt is calling again for her RX to be sent to the pharmacy.

## 2017-02-16 NOTE — Telephone Encounter (Signed)
Returned call and left a voice message for patient. Based on her lab and x-ray results, we'll send a prescription for gabapentin 100 mg 3 times daily to her pharmacy. Patient to follow-up in one month.

## 2017-02-17 ENCOUNTER — Telehealth: Payer: Self-pay

## 2017-02-17 MED ORDER — VITAMIN D (ERGOCALCIFEROL) 1.25 MG (50000 UNIT) PO CAPS: 50000.0000 [IU] | ORAL_CAPSULE | ORAL | 0 refills | Status: DC

## 2017-02-17 NOTE — Telephone Encounter (Signed)
Patient has been notified of lab results and a prescription for vitamin D3 50,000 units take 1 capsule once a week x12 weeks has been sent to [facility] per Dr. Manuella Ghazi, patient verbalized understanding

## 2017-02-17 NOTE — Telephone Encounter (Signed)
Patient has been notified of lab results and medications sent to Pharmacy per Dr. Manuella Ghazi

## 2017-02-28 ENCOUNTER — Ambulatory Visit: Payer: Medicaid Other | Admitting: Family Medicine

## 2017-03-01 ENCOUNTER — Telehealth: Payer: Self-pay | Admitting: Family Medicine

## 2017-03-01 NOTE — Telephone Encounter (Signed)
I had requested that patient should be referred to rheumatology for positive ANA to rule out autoimmune diseases. Please confirm and will place a referral for rheumatology. In the meantime, she can schedule appointment to discuss changing to a different agent such as Lyrica

## 2017-03-01 NOTE — Telephone Encounter (Signed)
Pt states the Gabatentin 100 mg is not working for her and she states she is still in pain. Pt would like to be advised as to what she can do.

## 2017-03-09 ENCOUNTER — Ambulatory Visit (INDEPENDENT_AMBULATORY_CARE_PROVIDER_SITE_OTHER): Payer: Self-pay | Admitting: Family Medicine

## 2017-03-09 ENCOUNTER — Ambulatory Visit: Payer: Self-pay | Admitting: Family Medicine

## 2017-03-09 ENCOUNTER — Encounter: Payer: Self-pay | Admitting: Family Medicine

## 2017-03-09 VITALS — BP 110/70 | HR 82 | Temp 98.2°F | Resp 16 | Ht 69.0 in | Wt 222.7 lb

## 2017-03-09 DIAGNOSIS — M79604 Pain in right leg: Secondary | ICD-10-CM

## 2017-03-09 DIAGNOSIS — M79605 Pain in left leg: Secondary | ICD-10-CM

## 2017-03-09 DIAGNOSIS — R202 Paresthesia of skin: Secondary | ICD-10-CM

## 2017-03-09 MED ORDER — DICLOFENAC SODIUM 75 MG PO TBEC: 75.0000 mg | DELAYED_RELEASE_TABLET | Freq: Two times a day (BID) | ORAL | 0 refills | Status: DC | PRN

## 2017-03-09 NOTE — Patient Instructions (Addendum)
Paresthesia Paresthesia is a burning or prickling feeling. This feeling can happen in any part of the body. It often happens in the hands, arms, legs, or feet. Usually, it is not painful. In most cases, the feeling goes away in a short time and is not a sign of a serious problem. Follow these instructions at home:  Avoid drinking alcohol.  Try massage or needle therapy (acupuncture) to help with your problems.  Keep all follow-up visits as told by your doctor. This is important. Contact a doctor if:  You keep on having episodes of paresthesia.  Your burning or prickling feeling gets worse when you walk.  You have pain or cramps.  You feel dizzy.  You have a rash. Get help right away if:  You feel weak.  You have trouble walking or moving.  You have problems speaking, understanding, or seeing.  You feel confused.  You cannot control when you pee (urinate) or poop (bowel movement).  You lose feeling (numbness) after an injury.  You pass out (faint). This information is not intended to replace advice given to you by your health care provider. Make sure you discuss any questions you have with your health care provider. Document Released: 05/19/2008 Document Revised: 11/12/2015 Document Reviewed: 06/02/2014 Elsevier Interactive Patient Education  2018 Elsevier Inc.  

## 2017-03-09 NOTE — Progress Notes (Addendum)
Name: Kara West   MRN: [id number]    DOB: 1976/03/14   Date:03/09/2017       Progress Note  Subjective  Chief Complaint  Chief Complaint  Patient presents with  . Foot Swelling    pain, feels like they are buring and pain now going up legs    HPI  Patient presents for worsening BLE and bilateral foot numbness and tingling. She also notes intermittent weakness - occurring daily for a few seconds, feels like she cannot balance on her legs for a few seconds after going from sitting to standing.  Does get mild dependent edema to RLE intermittently.  Reviewed visit with PCP Dr. Manuella Ghazi on 02/14/2017 - Bilateral foot Xrays were unconcerning, Labs showed very mildly elevated ANA, slightly low Vitamin D, normal B12 and folate, CMP, CBC, TSH, and A1C.  She is taking Gabapentin 180m TID with no relief of pain.  No loss of bowel/bladder control, no BUE numbness/tingling, chest pain/shortness of breath, no recent falls. 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   Patient Active Problem List   Diagnosis Date Noted  . Paresthesia of both feet 02/16/2017  . Moderate Persistent Asthma 02/18/2015  . Allergic rhinitis 02/18/2015  . COPD exacerbation ([place] 12/01/2014    Social History  Substance Use Topics  . Smoking status: Former Smoker    Packs/day: 0.20    Years: 5.00    Types: Cigarettes    Quit date: 06/11/2014  . Smokeless tobacco: Never Used  . Alcohol use 0.0 oz/week     Comment: occ     Current Outpatient Prescriptions:  .  albuterol (PROVENTIL HFA;VENTOLIN HFA) 108 (90 Base) MCG/ACT inhaler, Inhale 2 puffs into the lungs every 6 (six) hours as needed for wheezing or shortness of breath., Disp: 1 Inhaler, Rfl: 2 .  gabapentin (NEURONTIN) 100 MG capsule, Take 1 capsule (100 mg total) by mouth 3 (three) times daily., Disp: 90 capsule, Rfl: 0 .  Spacer/Aero-Holding Chambers  (AEROCHAMBER MV) inhaler, Use as instructed, Disp: 1 each, Rfl: 0 .  Vitamin D, Ergocalciferol, (DRISDOL) 50000 units CAPS capsule, Take 1 capsule (50,000 Units total) by mouth once a week. For 12 weeks, Disp: 12 capsule, Rfl: 0 .  albuterol-ipratropium (COMBIVENT) 18-103 MCG/ACT inhaler, Inhale 2 puffs into the lungs 4 (four) times daily as needed for wheezing., Disp: 1 Inhaler, Rfl: 2  No Known Allergies  ROS  Ten systems reviewed and is negative except as mentioned in HPI  Objective  Vitals:   03/09/17 1339  BP: 110/70  Pulse: 82  Resp: 16  Temp: 98.2 F (36.8 C)  TempSrc: Oral  SpO2: 97%  Weight: 222 lb 11.2 oz (101 kg)  Height: _0  (1.753 m)   Body mass index is 32.89 kg/m.  Nursing Note and Vital Signs reviewed.  Physical Exam  Constitutional: Patient appears well-developed and well-nourished. Obese No distress.  HEENT: head atraumatic, normocephalic Cardiovascular: Normal rate, regular rhythm, S1/S2 present.  No murmur or rub heard. Trace RIGHT LE edema. Pedal pulses +2 bilaterally Pulmonary/Chest: Effort normal and breath sounds clear. No respiratory distress or retractions. Psychiatric: Patient has a normal mood and affect. behavior is normal. Judgment and thought content normal. Musculoskeletal: Normal range of motion, no joint effusions. No gross deformities. No crepitus to ankles or knees, strength is equal in BLE, no bony tenderness. Neurological: she is alert and  oriented to person, place, and time. No cranial nerve deficit. Coordination, balance, strength, speech and gait are normal. Sensation is equal to BLE. Skin: Skin is warm and dry. No rash noted. No erythema. BLE warm and dry and appropriate for ethnicity.   Psychiatric: Patient has a normal mood and affect. behavior is normal. Judgment and thought content normal.   Recent Results (from the past 2160 hour(s))  POCT glycosylated hemoglobin (Hb A1C)     Status: Normal   Collection Time: 02/14/17  3:40  PM  Result Value Ref Range   Hemoglobin A1C 5.0   CBC with Differential/Platelet     Status: Abnormal   Collection Time: 02/14/17  3:51 PM  Result Value Ref Range   WBC 4.6 3.8 - 10.8 K/uL   RBC 4.56 3.80 - 5.10 MIL/uL   Hemoglobin 12.8 11.7 - 15.5 g/dL   HCT 40.9 35.0 - 45.0 %   MCV 89.7 80.0 - 100.0 fL   MCH 28.1 27.0 - 33.0 pg   MCHC 31.3 (L) 32.0 - 36.0 g/dL   RDW 14.9 11.0 - 15.0 %   Platelets 228 140 - 400 K/uL   MPV 11.2 7.5 - 12.5 fL   Neutro Abs 2,116 1,500 - 7,800 cells/uL   Lymphs Abs 2,116 850 - 3,900 cells/uL   Monocytes Absolute 276 200 - 950 cells/uL   Eosinophils Absolute 92 15 - 500 cells/uL   Basophils Absolute 0 0 - 200 cells/uL   Neutrophils Relative % 46 %   Lymphocytes Relative 46 %   Monocytes Relative 6 %   Eosinophils Relative 2 %   Basophils Relative 0 %   Smear Review Criteria for review not met   COMPLETE METABOLIC PANEL WITH GFR     Status: Abnormal   Collection Time: 02/14/17  3:51 PM  Result Value Ref Range   Sodium 138 135 - 146 mmol/L   Potassium 3.5 3.5 - 5.3 mmol/L   Chloride 106 98 - 110 mmol/L   CO2 19 (L) 20 - 32 mmol/L    Comment: ** Please note change in reference range(s). **      Glucose, Bld 81 65 - 99 mg/dL   BUN 8 7 - 25 mg/dL   Creat 0.62 0.50 - 1.10 mg/dL   Total Bilirubin 0.7 0.2 - 1.2 mg/dL   Alkaline Phosphatase 72 33 - 115 U/L   AST 16 10 - 30 U/L   ALT 17 6 - 29 U/L   Total Protein 6.6 6.1 - 8.1 g/dL   Albumin 4.1 3.6 - 5.1 g/dL   Calcium 9.6 8.6 - 10.2 mg/dL   GFR, Est African American >89 >=60 mL/min   GFR, Est Non African American >89 >=60 mL/min  TSH     Status: None   Collection Time: 02/14/17  3:51 PM  Result Value Ref Range   TSH 1.34 mIU/L    Comment:   Reference Range   > or = 20 Years  0.40-4.50   Pregnancy Range First trimester  0.26-2.66 Second trimester 0.55-2.73 Third trimester  0.43-2.91     VITAMIN D 25 Hydroxy (Vit-D Deficiency, Fractures)     Status: Abnormal   Collection Time:  02/14/17  3:51 PM  Result Value Ref Range   Vit D, 25-Hydroxy 23 (L) 30 - 100 ng/mL    Comment: Vitamin D Status           25-OH Vitamin D        Deficiency                <  20 ng/mL        Insufficiency         20 - 29 ng/mL        Optimal             > or = 30 ng/mL   For 25-OH Vitamin D testing on patients on D2-supplementation and patients for whom quantitation of D2 and D3 fractions is required, the Quest[facility] 25-OH VIT D, (D2,D3), LC/MS/MS is recommended: order code [id number] (patients > 2 yrs).   B12 and Folate Panel     Status: None   Collection Time: 02/14/17  3:51 PM  Result Value Ref Range   Vitamin B-12 544 200 - 1,100 pg/mL   Folate 8.2 >5.4 ng/mL    Comment: Reference Range >17 years:   Low: <3.4 ng/mL              Borderline: 3.4-5.4 ng/mL              Normal: >5.4 ng/mL     ANA     Status: Abnormal   Collection Time: 02/14/17  3:51 PM  Result Value Ref Range   Anit Nuclear Antibody(ANA) POS (A) NEGATIVE  Anti-nuclear ab-titer (ANA titer)     Status: Abnormal   Collection Time: 02/14/17  3:51 PM  Result Value Ref Range   ANA Pattern 1 HOMOGENEOUS (A)    ANA Titer 1 1:40 (H) titer    Comment:           Reference Range           < 1:40      Negative             1:40-1:80 Low Antibody level           > 1:80      Elevated Antibody level      Assessment & Plan  1. Paresthesia of both feet - Ambulatory referral to Neurology  2. Bilateral lower extremity pain - Ambulatory referral to Neurology - diclofenac (VOLTAREN) 75 MG EC tablet; Take 1 tablet (75 mg total) by mouth 2 (two) times daily as needed.  Dispense: 30 tablet; Refill: 0  -Red flags and when to present for emergency care or RTC including fever >101.24F, saddle anesthesia, extremity weakness that does not go away after a few seconds, fall, new/worsening/un-resolving symptoms, reviewed with patient at time of visit. Follow up and care instructions discussed and provided in AVS.  I have reviewed this  encounter including the documentation in this note and/or discussed this patient with the Johney Maine, FNP, NP-C. I am certifying that I agree with the content of this note as supervising physician.  Steele Sizer, MD [facility] 03/12/2017, 3:27 PM

## 2017-03-16 ENCOUNTER — Other Ambulatory Visit: Payer: Self-pay | Admitting: Family Medicine

## 2017-03-16 DIAGNOSIS — R202 Paresthesia of skin: Secondary | ICD-10-CM

## 2017-03-17 ENCOUNTER — Ambulatory Visit: Payer: Self-pay | Admitting: Family Medicine

## 2017-03-26 ENCOUNTER — Emergency Department
Admission: EM | Admit: 2017-03-26 | Discharge: 2017-03-26 | Disposition: A | Discharge status: Home / Self Care | Payer: Managed Care, Other (non HMO) | Attending: Emergency Medicine | Admitting: Emergency Medicine

## 2017-03-26 ENCOUNTER — Encounter: Payer: Self-pay | Admitting: Emergency Medicine

## 2017-03-26 DIAGNOSIS — Z87891 Personal history of nicotine dependence: Secondary | ICD-10-CM | POA: Insufficient documentation

## 2017-03-26 DIAGNOSIS — J209 Acute bronchitis, unspecified: Secondary | ICD-10-CM

## 2017-03-26 DIAGNOSIS — J42 Unspecified chronic bronchitis: Secondary | ICD-10-CM | POA: Diagnosis not present

## 2017-03-26 DIAGNOSIS — Z79899 Other long term (current) drug therapy: Secondary | ICD-10-CM | POA: Diagnosis not present

## 2017-03-26 DIAGNOSIS — R05 Cough: Secondary | ICD-10-CM | POA: Diagnosis present

## 2017-03-26 MED ORDER — PREDNISONE 50 MG PO TABS: 50.0000 mg | ORAL_TABLET | Freq: Every day | ORAL | 0 refills | Status: DC

## 2017-03-26 MED ORDER — PSEUDOEPH-BROMPHEN-DM 30-2-10 MG/5ML PO SYRP: 10.0000 mL | ORAL_SOLUTION | Freq: Four times a day (QID) | ORAL | 0 refills | Status: DC | PRN

## 2017-03-26 MED ORDER — ALBUTEROL SULFATE (2.5 MG/3ML) 0.083% IN NEBU
2.5000 mg | INHALATION_SOLUTION | Freq: Once | RESPIRATORY_TRACT | Status: AC
  Administered 2017-03-26: 2.5 mg via RESPIRATORY_TRACT
  Filled 2017-03-26: qty 3

## 2017-03-26 MED ORDER — METHYLPREDNISOLONE SODIUM SUCC 125 MG IJ SOLR
125.0000 mg | Freq: Once | INTRAMUSCULAR | Status: AC
  Administered 2017-03-26: 125 mg via INTRAMUSCULAR
  Filled 2017-03-26: qty 2

## 2017-03-26 NOTE — ED Provider Notes (Signed)
[facility] Emergency Department Provider Note  ____________________________________________  Time seen: Approximately 9:39 PM  I have reviewed the triage vital signs and the nursing notes.   HISTORY  Chief Complaint URI    HPI Kara West is a 41 y.o. female who presents emergency department complaining of cough, mild chest tightness for the past 2-3 days. Patient reports that she has a history of chronic bronchitis and COPD. She's been using her Advair as prescribed but states that her symptoms are not responding well. Patient reports that she has used an over-the-counter cold medication with minimal relief. She states that typically when she "gets like this" she needs steroids. Patient denies any headache, visual changes, nasal congestion, ear pressure, sore throat, abdominal pain, nausea or vomiting. No fevers or chills. No shortness of breath or chest pain.   Past Medical History:  Diagnosis Date  . Anemia   . Bronchitis   . COPD (chronic obstructive pulmonary disease) [facility])     Patient Active Problem List   Diagnosis Date Noted  . Paresthesia of both feet 02/16/2017  . Moderate Persistent Asthma 02/18/2015  . Allergic rhinitis 02/18/2015  . COPD exacerbation ([place]) 12/01/2014    Past Surgical History:  Procedure Laterality Date  . CESAREAN SECTION    . DILATION AND CURETTAGE OF UTERUS    . TUBAL LIGATION      Prior to Admission medications   Medication Sig Start Date End Date Taking? Authorizing Provider  albuterol (PROVENTIL HFA;VENTOLIN HFA) 108 (90 Base) MCG/ACT inhaler Inhale 2 puffs into the lungs every 6 (six) hours as needed for wheezing or shortness of breath. 06/17/16   Johnn Hai, PA-C  albuterol-ipratropium (COMBIVENT) 18-103 MCG/ACT inhaler Inhale 2 puffs into the lungs 4 (four) times daily as needed for wheezing. 09/15/15 09/14/16  Sherrie George B, FNP  brompheniramine-pseudoephedrine-DM 30-2-10 MG/5ML syrup Take 10  mLs by mouth 4 (four) times daily as needed. 03/26/17   Cuthriell, Charline Bills, PA-C  diclofenac (VOLTAREN) 75 MG EC tablet Take 1 tablet (75 mg total) by mouth 2 (two) times daily as needed. 03/09/17   Hubbard Hartshorn, FNP  gabapentin (NEURONTIN) 100 MG capsule Take 1 capsule (100 mg total) by mouth 3 (three) times daily. 02/16/17   Roselee Nova, MD  predniSONE (DELTASONE) 50 MG tablet Take 1 tablet (50 mg total) by mouth daily with breakfast. 03/26/17   Cuthriell, Charline Bills, PA-C  Spacer/Aero-Holding Chambers (AEROCHAMBER MV) inhaler Use as instructed 02/18/15   Juanito Doom, MD  Vitamin D, Ergocalciferol, (DRISDOL) 50000 units CAPS capsule Take 1 capsule (50,000 Units total) by mouth once a week. For 12 weeks 02/17/17   Roselee Nova, MD    Allergies Patient has no known allergies.  Family History  Problem Relation Age of Onset  . Hypertension Mother     Social History Social History  Substance Use Topics  . Smoking status: Former Smoker    Packs/day: 0.20    Years: 5.00    Types: Cigarettes    Quit date: 06/11/2014  . Smokeless tobacco: Never Used  . Alcohol use 0.0 oz/week     Comment: occ     Review of Systems  Constitutional: No fever/chills Eyes: No visual changes. No discharge ENT: No upper respiratory complaints. Cardiovascular: no chest pain. Respiratory: positive for nonproductive cough. No SOB. Gastrointestinal: No abdominal pain.  No nausea, no vomiting.  No diarrhea.  No constipation. Musculoskeletal: Negative for musculoskeletal pain. Skin: Negative for rash,  abrasions, lacerations, ecchymosis. Neurological: Negative for headaches, focal weakness or numbness. 10-point ROS otherwise negative.  ____________________________________________   PHYSICAL EXAM:  VITAL SIGNS: ED Triage Vitals  Enc Vitals Group     BP 03/26/17 2059 122/77     Pulse Rate 03/26/17 2059 68     Resp 03/26/17 2059 18     Temp 03/26/17 2059 97.9 F (36.6 C)     Temp  Source 03/26/17 2059 Oral     SpO2 03/26/17 2059 99 %     Weight 03/26/17 2100 222 lb (100.7 kg)     Height 03/26/17 2100 5\' 9"  (1.753 m)     Head Circumference --      Peak Flow --      Pain Score 03/26/17 2059 10     Pain Loc --      Pain Edu? --      Excl. in [place]? --      Constitutional: Alert and oriented. Well appearing and in no acute distress. Eyes: Conjunctivae are normal. PERRL. EOMI. Head: Atraumatic. ENT:      Ears: EACs and TMs unremarkable bilaterally.      Nose: No congestion/rhinnorhea.      Mouth/Throat: Mucous membranes are moist. Oropharynx is nonerythematous and nonedematous. Uvula is midline. Neck: No stridor.   Hematological/Lymphatic/Immunilogical: No cervical lymphadenopathy. Cardiovascular: Normal rate, regular rhythm. Normal S1 and S2.  Good peripheral circulation. Respiratory: Normal respiratory effort without tachypnea or retractions. Lungs with scattered coarse breath sounds. No definitive wheezing. No rales or rhonchi.Kermit Balo air entry to the bases with no decreased or absent breath sounds. Musculoskeletal: Full range of motion to all extremities. No gross deformities appreciated. Neurologic:  Normal speech and language. No gross focal neurologic deficits are appreciated.  Skin:  Skin is warm, dry and intact. No rash noted. Psychiatric: Mood and affect are normal. Speech and behavior are normal. Patient exhibits appropriate insight and judgement.   ____________________________________________   LABS (all labs ordered are listed, but only abnormal results are displayed)  Labs Reviewed - No data to display ____________________________________________  EKG   ____________________________________________  RADIOLOGY   No results found.  ____________________________________________    PROCEDURES  Procedure(s) performed:    Procedures    Medications  albuterol (PROVENTIL) (2.5 MG/3ML) 0.083% nebulizer solution 2.5 mg (not administered)   methylPREDNISolone sodium succinate (SOLU-MEDROL) 125 mg/2 mL injection 125 mg (not administered)     ____________________________________________   INITIAL IMPRESSION / ASSESSMENT AND PLAN / ED COURSE  Pertinent labs & imaging results that were available during my care of the patient were reviewed by me and considered in my medical decision making (see chart for details).  Review of the [place] CSRS was performed in accordance of the [place] prior to dispensing any controlled drugs.     Patient's diagnosis is consistent with exacerbation of chronic bronchitis. Exam is reassuring. No indication for labs or imaging at this time. Patient is given albuterol treatment and shot of Solu-Medrol emergency department.. Patient will be discharged home with prescriptions for short course of prednisone and Bromfed cough syrup for symptom control. Patient is to follow up with primary care as needed or otherwise directed. Patient is given ED precautions to return to the ED for any worsening or new symptoms.     ____________________________________________  FINAL CLINICAL IMPRESSION(S) / ED DIAGNOSES  Final diagnoses:  Chronic bronchitis with acute exacerbation (HCC)      NEW MEDICATIONS STARTED DURING THIS VISIT:  New Prescriptions   BROMPHENIRAMINE-PSEUDOEPHEDRINE-DM 30-2-10 MG/5ML  SYRUP    Take 10 mLs by mouth 4 (four) times daily as needed.   PREDNISONE (DELTASONE) 50 MG TABLET    Take 1 tablet (50 mg total) by mouth daily with breakfast.        This chart was dictated using voice recognition software/Dragon. Despite best efforts to proofread, errors can occur which can change the meaning. Any change was purely unintentional.    Darletta Moll, PA-C 03/26/17 2206    Nance Pear, MD 03/26/17 2207

## 2017-03-26 NOTE — ED Triage Notes (Signed)
Pt arrives POV with c/o URI. Pt states "I think that my bronchitis is flaring up". Pt has clear air movement throughout all fields with sinus drainage. Pt is ambulatory to triage with NAD.

## 2017-05-16 NOTE — Telephone Encounter (Signed)
This encounter was created in error - please disregard.

## 2017-05-18 ENCOUNTER — Encounter: Payer: Self-pay | Admitting: Maternal Newborn

## 2017-05-24 ENCOUNTER — Other Ambulatory Visit: Payer: Self-pay

## 2017-05-24 ENCOUNTER — Encounter: Payer: Self-pay | Admitting: Emergency Medicine

## 2017-05-24 ENCOUNTER — Emergency Department
Admission: EM | Admit: 2017-05-24 | Discharge: 2017-05-24 | Disposition: A | Discharge status: Home / Self Care | Payer: Managed Care, Other (non HMO) | Attending: Emergency Medicine | Admitting: Emergency Medicine

## 2017-05-24 DIAGNOSIS — Z87891 Personal history of nicotine dependence: Secondary | ICD-10-CM | POA: Insufficient documentation

## 2017-05-24 DIAGNOSIS — G629 Polyneuropathy, unspecified: Secondary | ICD-10-CM

## 2017-05-24 DIAGNOSIS — Z79899 Other long term (current) drug therapy: Secondary | ICD-10-CM | POA: Diagnosis not present

## 2017-05-24 DIAGNOSIS — M79672 Pain in left foot: Secondary | ICD-10-CM

## 2017-05-24 DIAGNOSIS — J449 Chronic obstructive pulmonary disease, unspecified: Secondary | ICD-10-CM | POA: Diagnosis not present

## 2017-05-24 DIAGNOSIS — M79673 Pain in unspecified foot: Secondary | ICD-10-CM | POA: Diagnosis present

## 2017-05-24 DIAGNOSIS — G5793 Unspecified mononeuropathy of bilateral lower limbs: Secondary | ICD-10-CM | POA: Insufficient documentation

## 2017-05-24 DIAGNOSIS — M79671 Pain in right foot: Secondary | ICD-10-CM

## 2017-05-24 MED ORDER — TRAMADOL HCL 50 MG PO TABS: 50.0000 mg | ORAL_TABLET | Freq: Four times a day (QID) | ORAL | 0 refills | Status: DC | PRN

## 2017-05-24 MED ORDER — PREDNISONE 20 MG PO TABS
60.0000 mg | ORAL_TABLET | Freq: Once | ORAL | Status: AC
  Administered 2017-05-24: 60 mg via ORAL
  Filled 2017-05-24: qty 3

## 2017-05-24 MED ORDER — TRAMADOL HCL 50 MG PO TABS
50.0000 mg | ORAL_TABLET | Freq: Once | ORAL | Status: AC
  Administered 2017-05-24: 50 mg via ORAL
  Filled 2017-05-24: qty 1

## 2017-05-24 MED ORDER — KETOROLAC TROMETHAMINE 60 MG/2ML IM SOLN
60.0000 mg | Freq: Once | INTRAMUSCULAR | Status: AC
  Administered 2017-05-24: 60 mg via INTRAMUSCULAR
  Filled 2017-05-24: qty 2

## 2017-05-24 MED ORDER — PREDNISONE 10 MG (21) PO TBPK: ORAL_TABLET | ORAL | 0 refills | Status: DC

## 2017-05-24 NOTE — Discharge Instructions (Signed)
Please follow up with your primary care physician as well as your neurologist as you have scheduled

## 2017-05-24 NOTE — ED Notes (Signed)
Patient was sleeping when went to assess pain

## 2017-05-24 NOTE — ED Provider Notes (Signed)
[facility] Emergency Department Provider Note   ____________________________________________   First MD Initiated Contact with Patient 05/24/17 0200     (approximate)  I have reviewed the triage vital signs and the nursing notes.   HISTORY  Chief Complaint Foot Pain    HPI Kara West is a 41 y.o. female who comes into the hospital today with some bilateral foot pain.  The patient has been having burning and tingling in her bilateral feet since July.  She reports that this is been going up into her ankles and her lower legs.  She reports that she saw her doctor and was tested for diabetes and they have came back normal.  The patient was started on gabapentin 300 mg 3 times a day which she reports she takes but she states it is not helping.  The patient reports that she was referred to see a specialist and has an appointment with neurology on the 18th.  She states that she was trying to wait but the pain was so severe today and yesterday that she had to come in.  She reports that it has been constant and does not get better.  Patient rates her pain a 10 out of 10 in intensity currently.  She decided to come into the hospital today for evaluation and treatment.   Past Medical History:  Diagnosis Date  . Anemia    s/p transfusion  . Bronchitis   . COPD (chronic obstructive pulmonary disease) ([place])   . GERD (gastroesophageal reflux disease)   . History of Papanicolaou smear of cervix 10/12/2011   neg    Patient Active Problem List   Diagnosis Date Noted  . Paresthesia of both feet 02/16/2017  . Moderate Persistent Asthma 02/18/2015  . Allergic rhinitis 02/18/2015  . COPD exacerbation ([place]) 12/01/2014    Past Surgical History:  Procedure Laterality Date  . CESAREAN SECTION     x3  . DILATION AND CURETTAGE OF UTERUS  11/11/2011   hysteroscopy  . ENDOMETRIAL ABLATION  11/11/2011  . TUBAL LIGATION  1997    Prior to Admission medications     Medication Sig Start Date End Date Taking? Authorizing Provider  albuterol (PROVENTIL HFA;VENTOLIN HFA) 108 (90 Base) MCG/ACT inhaler Inhale 2 puffs into the lungs every 6 (six) hours as needed for wheezing or shortness of breath. 06/17/16   Johnn Hai, PA-C  albuterol-ipratropium (COMBIVENT) 18-103 MCG/ACT inhaler Inhale 2 puffs into the lungs 4 (four) times daily as needed for wheezing. 09/15/15 09/14/16  Sherrie George B, FNP  brompheniramine-pseudoephedrine-DM 30-2-10 MG/5ML syrup Take 10 mLs by mouth 4 (four) times daily as needed. 03/26/17   Cuthriell, Charline Bills, PA-C  diclofenac (VOLTAREN) 75 MG EC tablet Take 1 tablet (75 mg total) by mouth 2 (two) times daily as needed. 03/09/17   Hubbard Hartshorn, FNP  gabapentin (NEURONTIN) 100 MG capsule Take 1 capsule (100 mg total) by mouth 3 (three) times daily. 02/16/17   Roselee Nova, MD  predniSONE (DELTASONE) 50 MG tablet Take 1 tablet (50 mg total) by mouth daily with breakfast. 03/26/17   Cuthriell, Charline Bills, PA-C  predniSONE (STERAPRED UNI-PAK 21 TAB) 10 MG (21) TBPK tablet Take 6 tabs on day 1 Take 5 tabs on day 2 Take 4 tabs on day 3 Take 3 tabs on day 4 Take 2 tabs on day 5 Take 1 tab on day 6 05/24/17   Loney Hering, MD  Spacer/Aero-Holding Chambers (AEROCHAMBER MV) inhaler Use  as instructed 02/18/15   Juanito Doom, MD  traMADol (ULTRAM) 50 MG tablet Take 1 tablet (50 mg total) by mouth every 6 (six) hours as needed. 05/24/17   Loney Hering, MD  Vitamin D, Ergocalciferol, (DRISDOL) 50000 units CAPS capsule Take 1 capsule (50,000 Units total) by mouth once a week. For 12 weeks 02/17/17   Roselee Nova, MD    Allergies Patient has no known allergies.  Family History  Problem Relation Age of Onset  . Hypertension Mother   . Diabetes Mother     Social History Social History   Tobacco Use  . Smoking status: Former Smoker    Packs/day: 0.20    Years: 5.00    Pack years: 1.00    Types: Cigarettes     Last attempt to quit: 06/11/2014    Years since quitting: 2.9  . Smokeless tobacco: Never Used  Substance Use Topics  . Alcohol use: Yes    Alcohol/week: 0.0 oz    Comment: occ  . Drug use: No    Review of Systems  Constitutional: No fever/chills Eyes: No visual changes. ENT: No sore throat. Cardiovascular: Denies chest pain. Respiratory: Denies shortness of breath. Gastrointestinal: No abdominal pain.  No nausea, no vomiting.  No diarrhea.  No constipation. Genitourinary: Negative for dysuria. Musculoskeletal: burning and tingling to bilateral feet Skin: Negative for rash. Neurological: Negative for headaches, focal weakness or numbness.   ____________________________________________   PHYSICAL EXAM:  VITAL SIGNS: ED Triage Vitals  Enc Vitals Group     BP 05/24/17 0038 114/81     Pulse Rate 05/24/17 0038 70     Resp 05/24/17 0038 20     Temp 05/24/17 0038 97.6 F (36.4 C)     Temp Source 05/24/17 0038 Oral     SpO2 05/24/17 0038 99 %     Weight 05/24/17 0020 225 lb (102.1 kg)     Height 05/24/17 0020 5\' 9"  (1.753 m)     Head Circumference --      Peak Flow --      Pain Score 05/24/17 0020 10     Pain Loc --      Pain Edu? --      Excl. in [place]? --     Constitutional: Alert and oriented. Well appearing and in mild distress. Eyes: Conjunctivae are normal. PERRL. EOMI. Head: Atraumatic. Nose: No congestion/rhinnorhea. Mouth/Throat: Mucous membranes are moist.  Oropharynx non-erythematous. Cardiovascular: Normal rate, regular rhythm. Grossly normal heart sounds.  Good peripheral circulation. Respiratory: Normal respiratory effort.  No retractions. Lungs CTAB. Gastrointestinal: Soft and nontender. No distention. Positive bowel sounds Musculoskeletal: No lower extremity tenderness nor edema.   Neurologic:  Normal speech and language.  Skin:  Skin is warm, dry and intact.  Psychiatric: Mood and affect are normal.   ____________________________________________     LABS (all labs ordered are listed, but only abnormal results are displayed)  Labs Reviewed - No data to display ____________________________________________  EKG  none ____________________________________________  RADIOLOGY  No results found.  ____________________________________________   PROCEDURES  Procedure(s) performed: None  Procedures  Critical Care performed: No  ____________________________________________   INITIAL IMPRESSION / ASSESSMENT AND PLAN / ED COURSE  As part of my medical decision making, I reviewed the following data within the electronic MEDICAL RECORD NUMBER Notes from prior ED visits and [place] Controlled Substance Database   This is a 41 year old female who comes into the hospital today with some burning and tingling to her bilateral  feet.  The patient has been having this going on for about 5-6 months.  She is on gabapentin and states that it is not improving.  My differential diagnosis includes neuropathy and other causes of paresthesia.  The patient has an appointment with neurology in approximately 2 weeks.  I feel that the patient needs to follow-up for further evaluation of this pain.  I gave the patient a shot of Toradol, tramadol and prednisone.  The patient's pain is improved to 4 out of 10.  The patient will be discharged home to follow-up with neurology.      ____________________________________________   FINAL CLINICAL IMPRESSION(S) / ED DIAGNOSES  Final diagnoses:  Neuropathy  Pain in both feet     ED Discharge Orders        Ordered    predniSONE (STERAPRED UNI-PAK 21 TAB) 10 MG (21) TBPK tablet     05/24/17 0449    traMADol (ULTRAM) 50 MG tablet  Every 6 hours PRN     05/24/17 0449       Note:  This document was prepared using Dragon voice recognition software and may include unintentional dictation errors.    Loney Hering, MD 05/24/17 0500

## 2017-05-24 NOTE — ED Triage Notes (Signed)
Patient ambulatory to triage with steady gait, without difficulty or distress noted; pt reports "burning" in feet for several months; st has seen PCP and rx gabapentin without relief

## 2017-05-26 ENCOUNTER — Encounter: Payer: Self-pay | Admitting: Maternal Newborn

## 2017-06-08 ENCOUNTER — Other Ambulatory Visit: Payer: Self-pay | Admitting: Neurology

## 2017-06-08 DIAGNOSIS — R202 Paresthesia of skin: Secondary | ICD-10-CM

## 2017-06-19 ENCOUNTER — Ambulatory Visit: Payer: Managed Care, Other (non HMO)

## 2017-06-27 ENCOUNTER — Ambulatory Visit
Admission: RE | Admit: 2017-06-27 | Discharge: 2017-06-27 | Disposition: A | Discharge status: Home / Self Care | Payer: Managed Care, Other (non HMO) | Source: Ambulatory Visit | Attending: Neurology | Admitting: Neurology

## 2017-06-27 DIAGNOSIS — D320 Benign neoplasm of cerebral meninges: Secondary | ICD-10-CM | POA: Insufficient documentation

## 2017-06-27 DIAGNOSIS — R202 Paresthesia of skin: Secondary | ICD-10-CM | POA: Diagnosis present

## 2017-06-27 MED ORDER — GADOBENATE DIMEGLUMINE 529 MG/ML IV SOLN
20.0000 mL | Freq: Once | INTRAVENOUS | Status: AC | PRN
  Administered 2017-06-27: 20 mL via INTRAVENOUS

## 2017-08-07 DIAGNOSIS — G608 Other hereditary and idiopathic neuropathies: Secondary | ICD-10-CM | POA: Insufficient documentation

## 2017-11-02 ENCOUNTER — Encounter (INDEPENDENT_AMBULATORY_CARE_PROVIDER_SITE_OTHER): Payer: Medicaid Other | Admitting: Vascular Surgery

## 2017-11-06 ENCOUNTER — Encounter (INDEPENDENT_AMBULATORY_CARE_PROVIDER_SITE_OTHER): Payer: Self-pay | Admitting: Vascular Surgery

## 2017-11-19 ENCOUNTER — Emergency Department
Admission: EM | Admit: 2017-11-19 | Discharge: 2017-11-19 | Disposition: A | Discharge status: Home / Self Care | Payer: No Typology Code available for payment source | Attending: Emergency Medicine | Admitting: Emergency Medicine

## 2017-11-19 ENCOUNTER — Other Ambulatory Visit: Payer: Self-pay

## 2017-11-19 DIAGNOSIS — R600 Localized edema: Secondary | ICD-10-CM | POA: Insufficient documentation

## 2017-11-19 DIAGNOSIS — Z79899 Other long term (current) drug therapy: Secondary | ICD-10-CM | POA: Insufficient documentation

## 2017-11-19 DIAGNOSIS — Z87891 Personal history of nicotine dependence: Secondary | ICD-10-CM | POA: Insufficient documentation

## 2017-11-19 DIAGNOSIS — J449 Chronic obstructive pulmonary disease, unspecified: Secondary | ICD-10-CM | POA: Insufficient documentation

## 2017-11-19 DIAGNOSIS — R609 Edema, unspecified: Secondary | ICD-10-CM

## 2017-11-19 LAB — URINALYSIS, COMPLETE (UACMP) WITH MICROSCOPIC
BILIRUBIN URINE: NEGATIVE
Bacteria, UA: NONE SEEN
GLUCOSE, UA: NEGATIVE mg/dL
HGB URINE DIPSTICK: NEGATIVE
KETONES UR: NEGATIVE mg/dL
LEUKOCYTES UA: NEGATIVE
Nitrite: NEGATIVE
PH: 8 (ref 5.0–8.0)
Protein, ur: NEGATIVE mg/dL
Specific Gravity, Urine: 1.015 (ref 1.005–1.030)
WBC UA: NONE SEEN WBC/hpf (ref 0–5)

## 2017-11-19 LAB — CBC
HCT: 40.3 % (ref 35.0–47.0)
Hemoglobin: 13.3 g/dL (ref 12.0–16.0)
MCH: 29.3 pg (ref 26.0–34.0)
MCHC: 33 g/dL (ref 32.0–36.0)
MCV: 88.9 fL (ref 80.0–100.0)
PLATELETS: 157 10*3/uL (ref 150–440)
RBC: 4.54 MIL/uL (ref 3.80–5.20)
RDW: 14.1 % (ref 11.5–14.5)
WBC: 3.5 10*3/uL — AB (ref 3.6–11.0)

## 2017-11-19 LAB — COMPREHENSIVE METABOLIC PANEL
ALT: 13 U/L — AB (ref 14–54)
AST: 17 U/L (ref 15–41)
Albumin: 3.9 g/dL (ref 3.5–5.0)
Alkaline Phosphatase: 63 U/L (ref 38–126)
Anion gap: 9 (ref 5–15)
BUN: 11 mg/dL (ref 6–20)
CHLORIDE: 106 mmol/L (ref 101–111)
CO2: 21 mmol/L — AB (ref 22–32)
CREATININE: 0.44 mg/dL (ref 0.44–1.00)
Calcium: 8.7 mg/dL — ABNORMAL LOW (ref 8.9–10.3)
GFR calc non Af Amer: >60 mL/min (ref >60)
Glucose, Bld: 90 mg/dL (ref 65–99)
POTASSIUM: 3.7 mmol/L (ref 3.5–5.1)
SODIUM: 136 mmol/L (ref 135–145)
Total Bilirubin: 0.7 mg/dL (ref 0.3–1.2)
Total Protein: 7 g/dL (ref 6.5–8.1)

## 2017-11-19 LAB — BRAIN NATRIURETIC PEPTIDE: B Natriuretic Peptide: 41 pg/mL (ref 0.0–100.0)

## 2017-11-19 LAB — PREGNANCY, URINE: Preg Test, Ur: NEGATIVE

## 2017-11-19 MED ORDER — HYDROCODONE-ACETAMINOPHEN 5-325 MG PO TABS
1.0000 | ORAL_TABLET | Freq: Once | ORAL | Status: AC
  Administered 2017-11-19: 1 via ORAL
  Filled 2017-11-19: qty 1

## 2017-11-19 MED ORDER — IBUPROFEN 600 MG PO TABS: 600.0000 mg | ORAL_TABLET | Freq: Four times a day (QID) | ORAL | 0 refills | Status: DC | PRN

## 2017-11-19 MED ORDER — FUROSEMIDE 40 MG PO TABS
40.0000 mg | ORAL_TABLET | Freq: Once | ORAL | Status: AC
  Administered 2017-11-19: 40 mg via ORAL
  Filled 2017-11-19: qty 1

## 2017-11-19 MED ORDER — HYDROCODONE-ACETAMINOPHEN 5-325 MG PO TABS: 1.0000 | ORAL_TABLET | Freq: Four times a day (QID) | ORAL | 0 refills | Status: AC | PRN

## 2017-11-19 NOTE — ED Provider Notes (Signed)
[facility] Emergency Department Provider Note ____________________________________________   First MD Initiated Contact with Patient 11/19/17 1139     (approximate)  I have reviewed the triage vital signs and the nursing notes.   HISTORY  Chief Complaint Leg Swelling    HPI Kara West is a 42 y.o. female with PMH as noted below who presents with bilateral lower extremity swelling over the last several days, gradual onset, persistent course, worse with standing, and somewhat painful.  Patient states that the same thing happened a few months ago and she was worked up by her regular doctor but no specific cause was found.  She denies any associated chest pain or shortness of breath.  No other abnormal swelling.  She states that she does stand for prolonged periods at work, however she has been working less than usual recently.   Past Medical History:  Diagnosis Date  . Anemia    s/p transfusion  . Bronchitis   . COPD (chronic obstructive pulmonary disease) ([place])   . GERD (gastroesophageal reflux disease)   . History of Papanicolaou smear of cervix 10/12/2011   neg    Patient Active Problem List   Diagnosis Date Noted  . Paresthesia of both feet 02/16/2017  . Moderate Persistent Asthma 02/18/2015  . Allergic rhinitis 02/18/2015  . COPD exacerbation ([place]) 12/01/2014    Past Surgical History:  Procedure Laterality Date  . CESAREAN SECTION     x3  . DILATION AND CURETTAGE OF UTERUS  11/11/2011   hysteroscopy  . ENDOMETRIAL ABLATION  11/11/2011  . TUBAL LIGATION  1997    Prior to Admission medications   Medication Sig Start Date End Date Taking? Authorizing Provider  albuterol (PROVENTIL HFA;VENTOLIN HFA) 108 (90 Base) MCG/ACT inhaler Inhale 2 puffs into the lungs every 6 (six) hours as needed for wheezing or shortness of breath. 06/17/16   Johnn Hai, PA-C  albuterol-ipratropium (COMBIVENT) 18-103 MCG/ACT inhaler Inhale 2 puffs  into the lungs 4 (four) times daily as needed for wheezing. 09/15/15 09/14/16  Sherrie George B, FNP  brompheniramine-pseudoephedrine-DM 30-2-10 MG/5ML syrup Take 10 mLs by mouth 4 (four) times daily as needed. 03/26/17   Cuthriell, Charline Bills, PA-C  diclofenac (VOLTAREN) 75 MG EC tablet Take 1 tablet (75 mg total) by mouth 2 (two) times daily as needed. 03/09/17   Hubbard Hartshorn, FNP  gabapentin (NEURONTIN) 100 MG capsule Take 1 capsule (100 mg total) by mouth 3 (three) times daily. 02/16/17   Roselee Nova, MD  HYDROcodone-acetaminophen (NORCO/VICODIN) 5-325 MG tablet Take 1 tablet by mouth every 6 (six) hours as needed for up to 5 days for severe pain. 11/19/17 11/24/17  Arta Silence, MD  ibuprofen (ADVIL,MOTRIN) 600 MG tablet Take 1 tablet (600 mg total) by mouth every 6 (six) hours as needed. 11/19/17   Arta Silence, MD  predniSONE (DELTASONE) 50 MG tablet Take 1 tablet (50 mg total) by mouth daily with breakfast. 03/26/17   Cuthriell, Charline Bills, PA-C  predniSONE (STERAPRED UNI-PAK 21 TAB) 10 MG (21) TBPK tablet Take 6 tabs on day 1 Take 5 tabs on day 2 Take 4 tabs on day 3 Take 3 tabs on day 4 Take 2 tabs on day 5 Take 1 tab on day 6 05/24/17   Loney Hering, MD  Spacer/Aero-Holding Chambers (AEROCHAMBER MV) inhaler Use as instructed 02/18/15   Juanito Doom, MD  traMADol (ULTRAM) 50 MG tablet Take 1 tablet (50 mg total) by mouth every 6 (  six) hours as needed. 05/24/17   Loney Hering, MD  Vitamin D, Ergocalciferol, (DRISDOL) 50000 units CAPS capsule Take 1 capsule (50,000 Units total) by mouth once a week. For 12 weeks 02/17/17   Roselee Nova, MD    Allergies Patient has no known allergies.  Family History  Problem Relation Age of Onset  . Hypertension Mother   . Diabetes Mother     Social History Social History   Tobacco Use  . Smoking status: Former Smoker    Packs/day: 0.20    Years: 5.00    Pack years: 1.00    Types: Cigarettes    Last attempt to  quit: 06/11/2014    Years since quitting: 3.4  . Smokeless tobacco: Never Used  Substance Use Topics  . Alcohol use: Yes    Alcohol/week: 0.0 oz    Comment: occ  . Drug use: No    Review of Systems  Constitutional: No fever. Eyes: No redness. ENT: No sore throat. Cardiovascular: Denies chest pain. Respiratory: Denies shortness of breath. Gastrointestinal: No vomiting. Genitourinary: Negative for dysuria.  Musculoskeletal: Negative for back pain. Skin: Negative for rash. Neurological: Negative for headache.   ____________________________________________   PHYSICAL EXAM:  VITAL SIGNS: ED Triage Vitals  Enc Vitals Group     BP 11/19/17 1006 104/61     Pulse Rate 11/19/17 1006 100     Resp 11/19/17 1006 18     Temp 11/19/17 1006 98.2 F (36.8 C)     Temp Source 11/19/17 1006 Oral     SpO2 11/19/17 1006 98 %     Weight 11/19/17 1006 226 lb (102.5 kg)     Height 11/19/17 1006 5\' 9"  (1.753 m)     Head Circumference --      Peak Flow --      Pain Score 11/19/17 1008 9     Pain Loc --      Pain Edu? --      Excl. in [place]? --     Constitutional: Alert and oriented.  Relatively well appearing and in no acute distress. Eyes: Conjunctivae are normal.  Head: Atraumatic. Nose: No congestion/rhinnorhea. Mouth/Throat: Mucous membranes are moist.   Neck: Normal range of motion.  Cardiovascular: Good peripheral circulation. Respiratory: Normal respiratory effort. Gastrointestinal:  No distention.  Musculoskeletal: 1+ bilateral lower extremity edema up to level of shins.  Extremities warm and well perfused.  No erythema, induration, or warmth.  2+ DP pulses bilaterally. Neurologic:  Normal speech and language. No gross focal neurologic deficits are appreciated.  Skin:  Skin is warm and dry. No rash noted. Psychiatric: Mood and affect are normal. Speech and behavior are normal.  ____________________________________________   LABS (all labs ordered are listed, but only  abnormal results are displayed)  Labs Reviewed  CBC - Abnormal; Notable for the following components:      Result Value   WBC 3.5 (*)    All other components within normal limits  COMPREHENSIVE METABOLIC PANEL - Abnormal; Notable for the following components:   CO2 21 (*)    Calcium 8.7 (*)    ALT 13 (*)    All other components within normal limits  URINALYSIS, COMPLETE (UACMP) WITH MICROSCOPIC - Abnormal; Notable for the following components:   Color, Urine YELLOW (*)    APPearance CLOUDY (*)    All other components within normal limits  BRAIN NATRIURETIC PEPTIDE  PREGNANCY, URINE   ____________________________________________  EKG   ____________________________________________  RADIOLOGY  ____________________________________________   PROCEDURES  Procedure(s) performed: No  Procedures  Critical Care performed: No ____________________________________________   INITIAL IMPRESSION / ASSESSMENT AND PLAN / ED COURSE  Pertinent labs & imaging results that were available during my care of the patient were reviewed by me and considered in my medical decision making (see chart for details).  42 year old female with PMH as noted above presents with bilateral lower extremity edema over the last several days, with history of a similar episode in the past which was worked up with no specific cause found.  On exam, the patient is well-appearing, vital signs are normal, and there is mild bilateral lower extremity edema but no evidence of cellulitis.  Initial labs obtained from triage are within normal limits.  Given this finding I do not there is no evidence of CHF, or hepatic or renal etiology.  I suspect most likely dependent edema, possibly related to venous stasis or other relatively benign etiology.  I will give a dose of p.o. Lasix here as well as analgesia.  Plan for discharge home with recommendations to elevate the legs, and I will give the patient a vascular  referral.  ----------------------------------------- 2:07 PM on 11/19/2017 -----------------------------------------  Patient's pain is significantly improved.  Discharge as per above plan.  Return precautions given, and the patient expresses understanding. ____________________________________________   FINAL CLINICAL IMPRESSION(S) / ED DIAGNOSES  Final diagnoses:  Peripheral edema      NEW MEDICATIONS STARTED DURING THIS VISIT:  New Prescriptions   HYDROCODONE-ACETAMINOPHEN (NORCO/VICODIN) 5-325 MG TABLET    Take 1 tablet by mouth every 6 (six) hours as needed for up to 5 days for severe pain.   IBUPROFEN (ADVIL,MOTRIN) 600 MG TABLET    Take 1 tablet (600 mg total) by mouth every 6 (six) hours as needed.     Note:  This document was prepared using Dragon voice recognition software and may include unintentional dictation errors.    Arta Silence, MD 11/19/17 1407

## 2017-11-19 NOTE — Discharge Instructions (Addendum)
Keep the legs elevated as much as possible.  Take ibuprofen for pain, and the hydrocodone only as needed for more severe pain.  Do not drive or operate machinery while taking that medication.  We have given you a referral for 1 of our vascular surgeons for follow-up.  You should also follow-up with your primary care doctor.  Return to the ER for new, worsening, persistent severe swelling, pain, rash, fever, difficulty walking, or any other new or worsening symptoms that concern you.

## 2017-11-19 NOTE — ED Triage Notes (Signed)
Pt c/o BL LE swelling with pain for the past 3 days. Denies SOB or other issues. Denies hx of htn or other medical issues. States this has happened before and did not find a cause.

## 2017-11-19 NOTE — ED Notes (Signed)
Pt resting in bed with family at beside. NAD noted. States pain now 4/10. Apologized for delay. Will continue to monitor.

## 2017-11-19 NOTE — ED Notes (Signed)
NAD noted at time of D/C. Pt denies questions or concerns. Pt ambulatory to the lobby at this time.  

## 2017-12-08 ENCOUNTER — Emergency Department
Admission: EM | Admit: 2017-12-08 | Discharge: 2017-12-08 | Disposition: A | Discharge status: Home / Self Care | Payer: No Typology Code available for payment source | Attending: Emergency Medicine | Admitting: Emergency Medicine

## 2017-12-08 ENCOUNTER — Encounter: Payer: Self-pay | Admitting: Emergency Medicine

## 2017-12-08 DIAGNOSIS — Z79899 Other long term (current) drug therapy: Secondary | ICD-10-CM | POA: Insufficient documentation

## 2017-12-08 DIAGNOSIS — J449 Chronic obstructive pulmonary disease, unspecified: Secondary | ICD-10-CM | POA: Insufficient documentation

## 2017-12-08 DIAGNOSIS — R609 Edema, unspecified: Secondary | ICD-10-CM

## 2017-12-08 DIAGNOSIS — Z87891 Personal history of nicotine dependence: Secondary | ICD-10-CM | POA: Insufficient documentation

## 2017-12-08 DIAGNOSIS — R600 Localized edema: Secondary | ICD-10-CM

## 2017-12-08 MED ORDER — HYDROMORPHONE HCL 1 MG/ML IJ SOLN
1.0000 mg | Freq: Once | INTRAMUSCULAR | Status: AC
  Administered 2017-12-08: 1 mg via INTRAMUSCULAR
  Filled 2017-12-08: qty 1

## 2017-12-08 MED ORDER — TRAMADOL HCL 50 MG PO TABS: 50.0000 mg | ORAL_TABLET | Freq: Four times a day (QID) | ORAL | 0 refills | Status: AC | PRN

## 2017-12-08 NOTE — ED Provider Notes (Signed)
[facility] Emergency Department Provider Note   ____________________________________________   First MD Initiated Contact with Patient 12/08/17 [phone]     (approximate)  I have reviewed the triage vital signs and the nursing notes.   HISTORY  Chief Complaint Foot Pain and Leg Pain    HPI Kara West is a 42 y.o. female patient presents with leg pain which is been the past year.  Patient was seen by his ED earlier this month and was advised to follow-up vascular clinic for definitive evaluation and treatment.  Patient has appointment on 18 December 2017.  Patient requesting pain medication until that time.  Patient rates the pain as a 10/10.  Patient described pain as bilateral lower extremity aching".  Patient denies shortness of breath or chest pain. Past Medical History:  Diagnosis Date  . Anemia    s/p transfusion  . Bronchitis   . COPD (chronic obstructive pulmonary disease) ([place])   . GERD (gastroesophageal reflux disease)   . History of Papanicolaou smear of cervix 10/12/2011   neg    Patient Active Problem List   Diagnosis Date Noted  . Paresthesia of both feet 02/16/2017  . Moderate Persistent Asthma 02/18/2015  . Allergic rhinitis 02/18/2015  . COPD exacerbation ([place]) 12/01/2014    Past Surgical History:  Procedure Laterality Date  . CESAREAN SECTION     x3  . DILATION AND CURETTAGE OF UTERUS  11/11/2011   hysteroscopy  . ENDOMETRIAL ABLATION  11/11/2011  . TUBAL LIGATION  1997    Prior to Admission medications   Medication Sig Start Date End Date Taking? Authorizing Provider  albuterol (PROVENTIL HFA;VENTOLIN HFA) 108 (90 Base) MCG/ACT inhaler Inhale 2 puffs into the lungs every 6 (six) hours as needed for wheezing or shortness of breath. 06/17/16   Johnn Hai, PA-C  albuterol-ipratropium (COMBIVENT) 18-103 MCG/ACT inhaler Inhale 2 puffs into the lungs 4 (four) times daily as needed for wheezing. 09/15/15 09/14/16   Sherrie George B, FNP  brompheniramine-pseudoephedrine-DM 30-2-10 MG/5ML syrup Take 10 mLs by mouth 4 (four) times daily as needed. 03/26/17   Cuthriell, Charline Bills, PA-C  diclofenac (VOLTAREN) 75 MG EC tablet Take 1 tablet (75 mg total) by mouth 2 (two) times daily as needed. 03/09/17   Hubbard Hartshorn, FNP  gabapentin (NEURONTIN) 100 MG capsule Take 1 capsule (100 mg total) by mouth 3 (three) times daily. 02/16/17   Roselee Nova, MD  ibuprofen (ADVIL,MOTRIN) 600 MG tablet Take 1 tablet (600 mg total) by mouth every 6 (six) hours as needed. 11/19/17   Arta Silence, MD  predniSONE (DELTASONE) 50 MG tablet Take 1 tablet (50 mg total) by mouth daily with breakfast. 03/26/17   Cuthriell, Charline Bills, PA-C  predniSONE (STERAPRED UNI-PAK 21 TAB) 10 MG (21) TBPK tablet Take 6 tabs on day 1 Take 5 tabs on day 2 Take 4 tabs on day 3 Take 3 tabs on day 4 Take 2 tabs on day 5 Take 1 tab on day 6 05/24/17   Loney Hering, MD  Spacer/Aero-Holding Chambers (AEROCHAMBER MV) inhaler Use as instructed 02/18/15   Juanito Doom, MD  traMADol (ULTRAM) 50 MG tablet Take 1 tablet (50 mg total) by mouth every 6 (six) hours as needed. 05/24/17   Loney Hering, MD  traMADol (ULTRAM) 50 MG tablet Take 1 tablet (50 mg total) by mouth every 6 (six) hours as needed. 12/08/17 12/08/18  Sable Feil, PA-C  Vitamin D, Ergocalciferol, (DRISDOL) 50000  units CAPS capsule Take 1 capsule (50,000 Units total) by mouth once a week. For 12 weeks 02/17/17   Roselee Nova, MD    Allergies Patient has no known allergies.  Family History  Problem Relation Age of Onset  . Hypertension Mother   . Diabetes Mother     Social History Social History   Tobacco Use  . Smoking status: Former Smoker    Packs/day: 0.20    Years: 5.00    Pack years: 1.00    Types: Cigarettes    Last attempt to quit: 06/11/2014    Years since quitting: 3.4  . Smokeless tobacco: Never Used  Substance Use Topics  . Alcohol use:  Yes    Alcohol/week: 0.0 oz    Comment: occ  . Drug use: No    Review of Systems  Constitutional: No fever/chills Eyes: No visual changes. ENT: No sore throat. Cardiovascular: Denies chest pain. Respiratory: Denies shortness of breath. Gastrointestinal: No abdominal pain.  No nausea, no vomiting.  No diarrhea.  No constipation. Genitourinary: Negative for dysuria. Musculoskeletal: Negative for back pain. Skin: Negative for rash. Neurological: Negative for headaches, focal weakness or numbness. Allergic/Immunilogical: Anemia. ____________________________________________   PHYSICAL EXAM:  VITAL SIGNS: ED Triage Vitals [12/08/17 0833]  Enc Vitals Group     BP 122/75     Pulse Rate 73     Resp 18     Temp 98.1 F (36.7 C)     Temp Source Oral     SpO2 100 %     Weight 225 lb (102.1 kg)     Height 5\' 9"  (1.753 m)     Head Circumference      Peak Flow      Pain Score 10     Pain Loc      Pain Edu?      Excl. in [place]?    Constitutional: Alert and oriented.  Moderate distress.     Eyes: Conjunctivae are normal. PERRL. EOMI. Cardiovascular: Normal rate, regular rhythm. Grossly normal heart sounds.  Good peripheral circulation 2+ dorsal pulses bilaterally. Respiratory: Normal respiratory effort.  No retractions. Lungs CTAB. Musculoskeletal: Bilateral ankle edema.  No joint effusions. Neurologic:  Normal speech and language. No gross focal neurologic deficits are appreciated. No gait instability. Skin:  Skin is warm, dry and intact. No rash noted.  No edema or erythema. Psychiatric: Mood and affect are normal. Speech and behavior are normal.  ____________________________________________   LABS (all labs ordered are listed, but only abnormal results are displayed)  Labs Reviewed - No data to display ____________________________________________  EKG   ____________________________________________  RADIOLOGY  ED MD interpretation:    Official radiology  report(s): No results found.  ____________________________________________   PROCEDURES  Procedure(s) performed: None  Procedures  Critical Care performed: No  ____________________________________________   INITIAL IMPRESSION / ASSESSMENT AND PLAN / ED COURSE  As part of my medical decision making, I reviewed the following data within the electronic MEDICAL RECORD NUMBER    Bilateral lower leg pain with peripheral edema.  Advised patient to follow-up with schedule vascular evaluation.  Patient given a prescription for tramadol and a work note.      ____________________________________________   FINAL CLINICAL IMPRESSION(S) / ED DIAGNOSES  Final diagnoses:  Mild peripheral edema     ED Discharge Orders        Ordered    traMADol (ULTRAM) 50 MG tablet  Every 6 hours PRN     12/08/17 0920  Note:  This document was prepared using Dragon voice recognition software and may include unintentional dictation errors.    Sable Feil, PA-C 12/08/17 4591    Earleen Newport, MD 12/08/17 [phone]

## 2017-12-08 NOTE — ED Notes (Signed)
See triage note   Presents with pain to both legs and feet   States she has had same sx's for about 1 year  States she pain made her legs weak the other day and fell  Landed on right knee   Min swelling noted to feet  Ambulates slowly d/t pain

## 2017-12-08 NOTE — ED Triage Notes (Signed)
Pt reports feet and leg pain for the past year. Pt states she  finaly has an appointment with the vascular and vein MD but not until July 1 and she can not handle the pain until then.

## 2017-12-08 NOTE — Discharge Instructions (Signed)
Follow-up with schedule vascular appointment take medication as directed.

## 2017-12-18 ENCOUNTER — Ambulatory Visit (INDEPENDENT_AMBULATORY_CARE_PROVIDER_SITE_OTHER): Payer: BLUE CROSS/BLUE SHIELD | Admitting: Vascular Surgery

## 2017-12-18 ENCOUNTER — Encounter (INDEPENDENT_AMBULATORY_CARE_PROVIDER_SITE_OTHER): Payer: Self-pay | Admitting: Vascular Surgery

## 2017-12-18 VITALS — BP 100/67 | HR 65 | Resp 16 | Ht 69.0 in | Wt 222.0 lb

## 2017-12-18 DIAGNOSIS — R600 Localized edema: Secondary | ICD-10-CM | POA: Diagnosis not present

## 2017-12-18 DIAGNOSIS — L03116 Cellulitis of left lower limb: Secondary | ICD-10-CM

## 2017-12-18 DIAGNOSIS — L03115 Cellulitis of right lower limb: Secondary | ICD-10-CM | POA: Diagnosis not present

## 2017-12-18 MED ORDER — CEPHALEXIN 500 MG PO CAPS: 500.0000 mg | ORAL_CAPSULE | Freq: Four times a day (QID) | ORAL | 0 refills | Status: DC

## 2017-12-18 NOTE — Progress Notes (Signed)
Subjective:    Patient ID: Kara West, female    DOB: February 10, 1976, 42 y.o.   MRN: [id number] Chief Complaint  Patient presents with  . New Patient (Initial Visit)    Leg swelling, Left   Presents as a new patient referred by Dr. Conley Rolls for evaluation of bilateral lower extremity edema and discomfort.  The patient notes that she has been experiencing swelling to the bilateral legs for almost a year.  The patient denies any recent or past surgery or trauma to the bilateral lower extremity.  The patient denies any DVT history.  The patient notes that her swelling has progressively worsened over the last year.  The patient notes that her swelling is associated with discomfort and intermittent erythema of the legs.  The patient states that her symptoms have progressed to the point that she is unable to function on a daily basis.  Her symptoms have become lifestyle limiting.  The patient does not engage in conservative therapy at this time.  The patient notes that her swelling / pain worsens towards the end of the day.  The denies any claudication-like symptoms, rest pain or ulceration to the bilateral lower extremity.  The patient denies any fever, nausea vomiting.  Review of Systems  Constitutional: Negative.   HENT: Negative.   Eyes: Negative.   Respiratory: Negative.   Cardiovascular: Positive for leg swelling.  Gastrointestinal: Negative.   Endocrine: Negative.   Genitourinary: Negative.   Musculoskeletal: Negative.   Skin: Positive for color change.  Allergic/Immunologic: Negative.   Neurological: Negative.   Hematological: Negative.   Psychiatric/Behavioral: Negative.       Objective:   Physical Exam  Constitutional: She is oriented to person, place, and time. She appears well-developed and well-nourished. No distress.  HENT:  Head: Normocephalic and atraumatic.  Right Ear: External ear normal.  Left Ear: External ear normal.  Eyes: Pupils are equal, round, and reactive  to light. Conjunctivae are normal.  Neck: Normal range of motion.  Cardiovascular: Normal rate, regular rhythm and normal heart sounds.  Pulses:      Radial pulses are 2+ on the right side, and 2+ on the left side.  Hard to palpate pedal pulses due to the patient's edema  Pulmonary/Chest: Effort normal and breath sounds normal.  Musculoskeletal: Normal range of motion. She exhibits edema (Moderate 1+ pitting edema noted bilaterally).  Neurological: She is alert and oriented to person, place, and time.  Skin: Skin is warm and dry. She is not diaphoretic.  Cellulitis to the bilateral legs from mid foot to mid calf.  There is no stasis dermatitis, fibrosis or active ulcerations noted.  Psychiatric: She has a normal mood and affect. Her behavior is normal. Judgment and thought content normal.  Vitals reviewed.  BP 100/67 (BP Location: Right Arm, Patient Position: Sitting)   Pulse 65   Resp 16   Ht 5\' 9"  (1.753 m)   Wt 222 lb (100.7 kg)   LMP 11/24/2017 (Approximate)   BMI 32.78 kg/m   Past Medical History:  Diagnosis Date  . Anemia    s/p transfusion  . Bronchitis   . COPD (chronic obstructive pulmonary disease) ([place])   . GERD (gastroesophageal reflux disease)   . History of Papanicolaou smear of cervix 10/12/2011   neg   Social History   Socioeconomic History  . Marital status: Legally Separated    Spouse name: Not on file  . Number of children: Not on file  . Years of education: Not  on file  . Highest education level: Not on file  Occupational History  . Not on file  Social Needs  . Financial resource strain: Not on file  . Food insecurity:    Worry: Not on file    Inability: Not on file  . Transportation needs:    Medical: Not on file    Non-medical: Not on file  Tobacco Use  . Smoking status: Former Smoker    Packs/day: 0.20    Years: 5.00    Pack years: 1.00    Types: Cigarettes    Last attempt to quit: 06/11/2014    Years since quitting: 3.5  . Smokeless  tobacco: Never Used  Substance and Sexual Activity  . Alcohol use: Yes    Alcohol/week: 0.0 oz    Comment: occ  . Drug use: No  . Sexual activity: Yes    Birth control/protection: Surgical    Comment: tubal  Lifestyle  . Physical activity:    Days per week: Not on file    Minutes per session: Not on file  . Stress: Not on file  Relationships  . Social connections:    Talks on phone: Not on file    Gets together: Not on file    Attends religious service: Not on file    Active member of club or organization: Not on file    Attends meetings of clubs or organizations: Not on file    Relationship status: Not on file  . Intimate partner violence:    Fear of current or ex partner: Not on file    Emotionally abused: Not on file    Physically abused: Not on file    Forced sexual activity: Not on file  Other Topics Concern  . Not on file  Social History Narrative  . Not on file   Past Surgical History:  Procedure Laterality Date  . CESAREAN SECTION     x3  . DILATION AND CURETTAGE OF UTERUS  11/11/2011   hysteroscopy  . ENDOMETRIAL ABLATION  11/11/2011  . TUBAL LIGATION  1997   Family History  Problem Relation Age of Onset  . Hypertension Mother   . Diabetes Mother    No Known Allergies     Assessment & Plan:  Presents as a new patient referred by Dr. Conley Rolls for evaluation of bilateral lower extremity edema and discomfort.  The patient notes that she has been experiencing swelling to the bilateral legs for almost a year.  The patient denies any recent or past surgery or trauma to the bilateral lower extremity.  The patient denies any DVT history.  The patient notes that her swelling has progressively worsened over the last year.  The patient notes that her swelling is associated with discomfort and intermittent erythema of the legs.  The patient states that her symptoms have progressed to the point that she is unable to function on a daily basis.  Her symptoms have become  lifestyle limiting.  The patient does not engage in conservative therapy at this time.  The patient notes that her swelling / pain worsens towards the end of the day.  The denies any claudication-like symptoms, rest pain or ulceration to the bilateral lower extremity.  The patient denies any fever, nausea vomiting.  1. Bilateral lower extremity edema - New In an attempt to gain control of the patient's edema I recommend undergoing 3 layer zinc oxide and wraps to the bilateral lower extremity for approximately 1 month.  Once her edema is  controlled I can transition her into medical grade 1 compression socks. In addition, behavioral modification including elevation during the day will be initiated. The patient is to follow-up in 1 month so I can assess her progress with Unna boot therapy I will bring the patient back and have her undergo bilateral lower extremity venous duplex to rule out reflux For the patient's cellulitis I will prescribe Keflex 500 mg 1 tab every the 6 hours x 10 days I will check the patient's legs in a week when she returns for an Unna boot change The patient was instructed to call the office in the interim if any worsening edema or ulcerations to the legs, feet or toes occurs. The patient expresses their understanding.  - VAS [place] LOWER EXTREMITY VENOUS REFLUX; Future  2. Bilateral cellulitis of lower leg - New As above  Current Outpatient Medications on File Prior to Visit  Medication Sig Dispense Refill  . albuterol (PROVENTIL HFA;VENTOLIN HFA) 108 (90 Base) MCG/ACT inhaler Inhale 2 puffs into the lungs every 6 (six) hours as needed for wheezing or shortness of breath. 1 Inhaler 2  . ferrous sulfate 325 (65 FE) MG tablet Take by mouth.    . traMADol (ULTRAM) 50 MG tablet Take 1 tablet (50 mg total) by mouth every 6 (six) hours as needed. 20 tablet 0  . albuterol-ipratropium (COMBIVENT) 18-103 MCG/ACT inhaler Inhale 2 puffs into the lungs 4 (four) times daily as needed for  wheezing. 1 Inhaler 2  . brompheniramine-pseudoephedrine-DM 30-2-10 MG/5ML syrup Take 10 mLs by mouth 4 (four) times daily as needed. (Patient not taking: Reported on 12/18/2017) 200 mL 0  . diclofenac (VOLTAREN) 75 MG EC tablet Take 1 tablet (75 mg total) by mouth 2 (two) times daily as needed. (Patient not taking: Reported on 12/18/2017) 30 tablet 0  . gabapentin (NEURONTIN) 100 MG capsule Take 1 capsule (100 mg total) by mouth 3 (three) times daily. (Patient not taking: Reported on 12/18/2017) 90 capsule 0  . ibuprofen (ADVIL,MOTRIN) 600 MG tablet Take 1 tablet (600 mg total) by mouth every 6 (six) hours as needed. (Patient not taking: Reported on 12/18/2017) 30 tablet 0  . LYRICA 75 MG capsule   0  . predniSONE (DELTASONE) 50 MG tablet Take 1 tablet (50 mg total) by mouth daily with breakfast. (Patient not taking: Reported on 12/18/2017) 5 tablet 0  . predniSONE (STERAPRED UNI-PAK 21 TAB) 10 MG (21) TBPK tablet Take 6 tabs on day 1 Take 5 tabs on day 2 Take 4 tabs on day 3 Take 3 tabs on day 4 Take 2 tabs on day 5 Take 1 tab on day 6 (Patient not taking: Reported on 12/18/2017) 21 tablet 0  . Spacer/Aero-Holding Chambers (AEROCHAMBER MV) inhaler Use as instructed 1 each 0  . thiamine 100 MG tablet Take by mouth.    . venlafaxine (EFFEXOR) 37.5 MG tablet Take by mouth.    . Vitamin D, Ergocalciferol, (DRISDOL) 50000 units CAPS capsule Take 1 capsule (50,000 Units total) by mouth once a week. For 12 weeks (Patient not taking: Reported on 12/18/2017) 12 capsule 0   No current facility-administered medications on file prior to visit.    There are no Patient Instructions on file for this visit. No follow-ups on file.  KIMBERLY A STEGMAYER, PA-C

## 2017-12-25 ENCOUNTER — Ambulatory Visit (INDEPENDENT_AMBULATORY_CARE_PROVIDER_SITE_OTHER): Payer: BLUE CROSS/BLUE SHIELD | Admitting: Vascular Surgery

## 2017-12-25 ENCOUNTER — Encounter (INDEPENDENT_AMBULATORY_CARE_PROVIDER_SITE_OTHER): Payer: Self-pay

## 2017-12-25 VITALS — BP 121/73 | HR 64 | Resp 17 | Ht 69.0 in | Wt 225.8 lb

## 2017-12-25 DIAGNOSIS — L03115 Cellulitis of right lower limb: Secondary | ICD-10-CM

## 2017-12-25 DIAGNOSIS — R600 Localized edema: Secondary | ICD-10-CM

## 2017-12-25 DIAGNOSIS — L03116 Cellulitis of left lower limb: Secondary | ICD-10-CM | POA: Diagnosis not present

## 2017-12-25 NOTE — Progress Notes (Signed)
History of Present Illness  There is no documented history at this time  Assessments & Plan   There are no diagnoses linked to this encounter.    Additional instructions  Subjective:  Patient presents with venous ulcer of the Bilateral lower extremity.    Procedure:  3 layer unna wrap was placed Bilateral lower extremity.   Plan:   Follow up in one week.  

## 2018-01-01 ENCOUNTER — Encounter (INDEPENDENT_AMBULATORY_CARE_PROVIDER_SITE_OTHER): Payer: Self-pay

## 2018-01-01 ENCOUNTER — Ambulatory Visit (INDEPENDENT_AMBULATORY_CARE_PROVIDER_SITE_OTHER): Payer: BLUE CROSS/BLUE SHIELD | Admitting: Vascular Surgery

## 2018-01-01 VITALS — BP 105/71 | HR 62 | Resp 17 | Ht 69.0 in | Wt 225.0 lb

## 2018-01-01 DIAGNOSIS — R600 Localized edema: Secondary | ICD-10-CM

## 2018-01-01 NOTE — Progress Notes (Signed)
History of Present Illness  There is no documented history at this time  Assessments & Plan   There are no diagnoses linked to this encounter.    Additional instructions  Subjective:  Patient presents with venous ulcer of the Bilateral lower extremity.    Procedure:  3 layer unna wrap was placed Bilateral lower extremity.   Plan:   Follow up in one week.  

## 2018-01-08 ENCOUNTER — Encounter (INDEPENDENT_AMBULATORY_CARE_PROVIDER_SITE_OTHER): Payer: Self-pay | Admitting: Vascular Surgery

## 2018-01-08 ENCOUNTER — Ambulatory Visit (INDEPENDENT_AMBULATORY_CARE_PROVIDER_SITE_OTHER): Payer: BLUE CROSS/BLUE SHIELD | Admitting: Vascular Surgery

## 2018-01-08 VITALS — BP 107/70 | HR 60 | Resp 18 | Ht 69.0 in | Wt 218.4 lb

## 2018-01-08 DIAGNOSIS — L03115 Cellulitis of right lower limb: Secondary | ICD-10-CM

## 2018-01-08 DIAGNOSIS — R202 Paresthesia of skin: Secondary | ICD-10-CM | POA: Diagnosis not present

## 2018-01-08 DIAGNOSIS — R600 Localized edema: Secondary | ICD-10-CM

## 2018-01-08 DIAGNOSIS — L03116 Cellulitis of left lower limb: Secondary | ICD-10-CM

## 2018-01-08 NOTE — Progress Notes (Signed)
Subjective:    Patient ID: Kara West, female    DOB: 12-30-1975, 42 y.o.   MRN: [id number] Chief Complaint  Patient presents with  . Follow-up    unna check   Patient presents for a monthly unna boot therapy follow-up.  The patient was initially seen on December 18, 2017 and evaluation of bilateral lower extremity edema and discomfort with paresthesia of her bilateral feet.  The patient was placed in 3 layer zinc oxide bilateral Unna wraps for approximately 1 month.  The patient was also treated for cellulitis at the time.  The patient presents today with improvement to her lower extremity edema, improvement in her discomfort and resolvent of her cellulitis.  The patient is very pleased with her results.  The patient denies any fever, nausea vomiting.  Patient denies any bilateral lower extremity claudication-like symptoms, rest pain or ulcer formation to the bilateral lower extremity.  Review of Systems  Constitutional: Negative.   HENT: Negative.   Eyes: Negative.   Respiratory: Negative.   Cardiovascular: Negative.   Gastrointestinal: Negative.   Endocrine: Negative.   Genitourinary: Negative.   Musculoskeletal: Negative.   Skin: Negative.   Allergic/Immunologic: Negative.   Neurological: Negative.   Hematological: Negative.   Psychiatric/Behavioral: Negative.       Objective:   Physical Exam  Constitutional: She is oriented to person, place, and time. She appears well-developed and well-nourished. No distress.  HENT:  Head: Normocephalic and atraumatic.  Right Ear: External ear normal.  Left Ear: External ear normal.  Eyes: Pupils are equal, round, and reactive to light. Conjunctivae and EOM are normal.  Neck: Normal range of motion.  Cardiovascular: Normal rate, regular rhythm, normal heart sounds and intact distal pulses.  Pulses:      Radial pulses are 2+ on the right side, and 2+ on the left side.       Dorsalis pedis pulses are 2+ on the right side, and 2+ on  the left side.       Posterior tibial pulses are 2+ on the right side, and 2+ on the left side.  Pulmonary/Chest: Effort normal and breath sounds normal.  Musculoskeletal: Normal range of motion. She exhibits edema (Bilateral lower extremity edema has now resolved.).  Neurological: She is alert and oriented to person, place, and time.  Skin: Skin is warm and dry. She is not diaphoretic.  Cellulitis has resolved.  Psychiatric: She has a normal mood and affect. Her behavior is normal. Judgment and thought content normal.  Vitals reviewed.  BP 107/70 (BP Location: Right Arm)   Pulse 60   Resp 18   Ht 5\' 9"  (1.753 m)   Wt 218 lb 6.4 oz (99.1 kg)   BMI 32.25 kg/m   Past Medical History:  Diagnosis Date  . Anemia    s/p transfusion  . Bronchitis   . COPD (chronic obstructive pulmonary disease) ([place])   . GERD (gastroesophageal reflux disease)   . History of Papanicolaou smear of cervix 10/12/2011   neg   Social History   Socioeconomic History  . Marital status: Legally Separated    Spouse name: Not on file  . Number of children: Not on file  . Years of education: Not on file  . Highest education level: Not on file  Occupational History  . Not on file  Social Needs  . Financial resource strain: Not on file  . Food insecurity:    Worry: Not on file    Inability: Not on file  .  Transportation needs:    Medical: Not on file    Non-medical: Not on file  Tobacco Use  . Smoking status: Former Smoker    Packs/day: 0.20    Years: 5.00    Pack years: 1.00    Types: Cigarettes    Last attempt to quit: 06/11/2014    Years since quitting: 3.5  . Smokeless tobacco: Never Used  Substance and Sexual Activity  . Alcohol use: Yes    Alcohol/week: 0.0 oz    Comment: occ  . Drug use: No  . Sexual activity: Yes    Birth control/protection: Surgical    Comment: tubal  Lifestyle  . Physical activity:    Days per week: Not on file    Minutes per session: Not on file  . Stress: Not  on file  Relationships  . Social connections:    Talks on phone: Not on file    Gets together: Not on file    Attends religious service: Not on file    Active member of club or organization: Not on file    Attends meetings of clubs or organizations: Not on file    Relationship status: Not on file  . Intimate partner violence:    Fear of current or ex partner: Not on file    Emotionally abused: Not on file    Physically abused: Not on file    Forced sexual activity: Not on file  Other Topics Concern  . Not on file  Social History Narrative  . Not on file   Past Surgical History:  Procedure Laterality Date  . CESAREAN SECTION     x3  . DILATION AND CURETTAGE OF UTERUS  11/11/2011   hysteroscopy  . ENDOMETRIAL ABLATION  11/11/2011  . TUBAL LIGATION  1997   Family History  Problem Relation Age of Onset  . Hypertension Mother   . Diabetes Mother    No Known Allergies     Assessment & Plan:  Patient presents for a monthly unna boot therapy follow-up.  The patient was initially seen on December 18, 2017 and evaluation of bilateral lower extremity edema and discomfort with paresthesia of her bilateral feet.  The patient was placed in 3 layer zinc oxide bilateral Unna wraps for approximately 1 month.  The patient was also treated for cellulitis at the time.  The patient presents today with improvement to her lower extremity edema, improvement in her discomfort and resolvent of her cellulitis.  The patient is very pleased with her results.  The patient denies any fever, nausea vomiting.  Patient denies any bilateral lower extremity claudication-like symptoms, rest pain or ulcer formation to the bilateral lower extremity.  1. Bilateral cellulitis of lower leg - Resolved Patient was treated with Keflex 500 mg 1 tab every 6 hours x10 days The patient presents today with resolvent of her cellulitis  2. Bilateral lower extremity edema - Stable The patient has undergone 1 month of 3 layer zinc  oxide Unna wraps to the bilateral lower extremity in an effort to gain control of her edema. This has been successful on the patient's edema has not resolved.  The patient will transition into medical grade 1 compression socks which she will wear on a daily basis Behavioral modification including elevation during the day will be continued. Anti-inflammatories for pain. The patient has an appointment in November 2019 to undergo bilateral lower extremity venous duplex to rule out any contributing venous versus lymphatic disease Information / prescription for compression stockings was  given to the patient. The patient was instructed to call the office in the interim if any worsening edema or ulcerations to the legs, feet or toes occurs. The patient expresses their understanding.  3. Paresthesia of both feet - Stable This is also improved since the patient's bilateral lower extremity edema has improved  Current Outpatient Medications on File Prior to Visit  Medication Sig Dispense Refill  . albuterol (PROVENTIL HFA;VENTOLIN HFA) 108 (90 Base) MCG/ACT inhaler Inhale 2 puffs into the lungs every 6 (six) hours as needed for wheezing or shortness of breath. 1 Inhaler 2  . cephALEXin (KEFLEX) 500 MG capsule Take 1 capsule (500 mg total) by mouth 4 (four) times daily. 40 capsule 0  . ferrous sulfate 325 (65 FE) MG tablet Take by mouth.    . gabapentin (NEURONTIN) 100 MG capsule Take 1 capsule (100 mg total) by mouth 3 (three) times daily. 90 capsule 0  . LYRICA 75 MG capsule   0  . predniSONE (STERAPRED UNI-PAK 21 TAB) 10 MG (21) TBPK tablet Take 6 tabs on day 1 Take 5 tabs on day 2 Take 4 tabs on day 3 Take 3 tabs on day 4 Take 2 tabs on day 5 Take 1 tab on day 6 21 tablet 0  . Spacer/Aero-Holding Chambers (AEROCHAMBER MV) inhaler Use as instructed 1 each 0  . thiamine 100 MG tablet Take by mouth.    . traMADol (ULTRAM) 50 MG tablet Take 1 tablet (50 mg total) by mouth every 6 (six) hours as  needed. 20 tablet 0  . venlafaxine (EFFEXOR) 37.5 MG tablet Take by mouth.    Marland Kitchen albuterol-ipratropium (COMBIVENT) 18-103 MCG/ACT inhaler Inhale 2 puffs into the lungs 4 (four) times daily as needed for wheezing. 1 Inhaler 2  . brompheniramine-pseudoephedrine-DM 30-2-10 MG/5ML syrup Take 10 mLs by mouth 4 (four) times daily as needed. (Patient not taking: Reported on 12/18/2017) 200 mL 0  . diclofenac (VOLTAREN) 75 MG EC tablet Take 1 tablet (75 mg total) by mouth 2 (two) times daily as needed. (Patient not taking: Reported on 12/18/2017) 30 tablet 0  . ibuprofen (ADVIL,MOTRIN) 600 MG tablet Take 1 tablet (600 mg total) by mouth every 6 (six) hours as needed. (Patient not taking: Reported on 12/18/2017) 30 tablet 0  . predniSONE (DELTASONE) 50 MG tablet Take 1 tablet (50 mg total) by mouth daily with breakfast. (Patient not taking: Reported on 12/18/2017) 5 tablet 0  . Vitamin D, Ergocalciferol, (DRISDOL) 50000 units CAPS capsule Take 1 capsule (50,000 Units total) by mouth once a week. For 12 weeks (Patient not taking: Reported on 12/18/2017) 12 capsule 0   No current facility-administered medications on file prior to visit.    There are no Patient Instructions on file for this visit. No follow-ups on file.  Marijane Trower A Taysom Glymph, PA-C

## 2018-01-15 ENCOUNTER — Encounter (INDEPENDENT_AMBULATORY_CARE_PROVIDER_SITE_OTHER): Payer: BLUE CROSS/BLUE SHIELD

## 2018-03-06 ENCOUNTER — Encounter: Payer: Self-pay | Admitting: Obstetrics & Gynecology

## 2018-03-06 ENCOUNTER — Ambulatory Visit (INDEPENDENT_AMBULATORY_CARE_PROVIDER_SITE_OTHER): Payer: BLUE CROSS/BLUE SHIELD | Admitting: Obstetrics & Gynecology

## 2018-03-06 ENCOUNTER — Other Ambulatory Visit (HOSPITAL_COMMUNITY)
Admission: RE | Admit: 2018-03-06 | Discharge: 2018-03-06 | Disposition: A | Discharge status: Home / Self Care | Payer: BLUE CROSS/BLUE SHIELD | Source: Ambulatory Visit | Attending: Obstetrics & Gynecology | Admitting: Obstetrics & Gynecology

## 2018-03-06 VITALS — BP 120/70 | Ht 69.0 in | Wt 218.0 lb

## 2018-03-06 DIAGNOSIS — Z01419 Encounter for gynecological examination (general) (routine) without abnormal findings: Secondary | ICD-10-CM

## 2018-03-06 DIAGNOSIS — Z124 Encounter for screening for malignant neoplasm of cervix: Secondary | ICD-10-CM

## 2018-03-06 DIAGNOSIS — Z1231 Encounter for screening mammogram for malignant neoplasm of breast: Secondary | ICD-10-CM

## 2018-03-06 DIAGNOSIS — N920 Excessive and frequent menstruation with regular cycle: Secondary | ICD-10-CM | POA: Diagnosis not present

## 2018-03-06 DIAGNOSIS — Z1239 Encounter for other screening for malignant neoplasm of breast: Secondary | ICD-10-CM

## 2018-03-06 DIAGNOSIS — Z0000 Encounter for general adult medical examination without abnormal findings: Secondary | ICD-10-CM

## 2018-03-06 NOTE — Progress Notes (Signed)
HPI:      Ms. Kara West is a 42 y.o. [phone] who LMP was Patient's last menstrual period was 02/17/2018., she presents today for her annual examination. The patient has no complaints today. Except as outlined below. The patient is sexually active. Her last pap: was normal and last mammogram: patient has never had a mammogram. The patient does perform self breast exams.  There is no notable family history of breast or ovarian cancer in her family.  The patient has regular exercise: yes.  The patient denies current symptoms of depression.    Pt reports worsening menorrhagia over the last 1 year, with periods becoming longer and more flow, using tampons and pads, leading to pain as well as fatigue and dizziness.  Pain is lower pelvis and radiation to low back, no associated sx's or modifiers other than rest and NSAIDs (mild help), pain is described as moderate and dull ache.  Pt has had dx of anemia, even requiring blood transfusion one time.  She has prior h/o D&C with ablation years ago for endometrial thickening.  No future fertility desired, has 6 grandchildren.  GYN History: Contraception: tubal ligation  PMHx: Past Medical History:  Diagnosis Date  . Anemia    s/p transfusion  . Bronchitis   . COPD (chronic obstructive pulmonary disease) ([place])   . GERD (gastroesophageal reflux disease)   . History of Papanicolaou smear of cervix 10/12/2011   neg   Past Surgical History:  Procedure Laterality Date  . CESAREAN SECTION     x3  . DILATION AND CURETTAGE OF UTERUS  11/11/2011   hysteroscopy  . ENDOMETRIAL ABLATION  11/11/2011  . TUBAL LIGATION  1997   Family History  Problem Relation Age of Onset  . Hypertension Mother   . Diabetes Mother    Social History   Tobacco Use  . Smoking status: Former Smoker    Packs/day: 0.20    Years: 5.00    Pack years: 1.00    Types: Cigarettes    Last attempt to quit: 06/11/2014    Years since quitting: 3.7  . Smokeless tobacco:  Never Used  Substance Use Topics  . Alcohol use: Yes    Alcohol/week: 0.0 standard drinks    Comment: occ  . Drug use: No    Current Outpatient Medications:  .  albuterol (PROVENTIL HFA;VENTOLIN HFA) 108 (90 Base) MCG/ACT inhaler, Inhale 2 puffs into the lungs every 6 (six) hours as needed for wheezing or shortness of breath. (Patient not taking: Reported on 03/06/2018), Disp: 1 Inhaler, Rfl: 2 .  albuterol-ipratropium (COMBIVENT) 18-103 MCG/ACT inhaler, Inhale 2 puffs into the lungs 4 (four) times daily as needed for wheezing., Disp: 1 Inhaler, Rfl: 2 .  brompheniramine-pseudoephedrine-DM 30-2-10 MG/5ML syrup, Take 10 mLs by mouth 4 (four) times daily as needed. (Patient not taking: Reported on 12/18/2017), Disp: 200 mL, Rfl: 0 .  cephALEXin (KEFLEX) 500 MG capsule, Take 1 capsule (500 mg total) by mouth 4 (four) times daily. (Patient not taking: Reported on 03/06/2018), Disp: 40 capsule, Rfl: 0 .  diclofenac (VOLTAREN) 75 MG EC tablet, Take 1 tablet (75 mg total) by mouth 2 (two) times daily as needed. (Patient not taking: Reported on 12/18/2017), Disp: 30 tablet, Rfl: 0 .  ferrous sulfate 325 (65 FE) MG tablet, Take by mouth., Disp: , Rfl:  .  gabapentin (NEURONTIN) 100 MG capsule, Take 1 capsule (100 mg total) by mouth 3 (three) times daily. (Patient not taking: Reported on 03/06/2018), Disp: 90  capsule, Rfl: 0 .  ibuprofen (ADVIL,MOTRIN) 600 MG tablet, Take 1 tablet (600 mg total) by mouth every 6 (six) hours as needed. (Patient not taking: Reported on 12/18/2017), Disp: 30 tablet, Rfl: 0 .  LYRICA 75 MG capsule, , Disp: , Rfl: 0 .  predniSONE (DELTASONE) 50 MG tablet, Take 1 tablet (50 mg total) by mouth daily with breakfast. (Patient not taking: Reported on 12/18/2017), Disp: 5 tablet, Rfl: 0 .  predniSONE (STERAPRED UNI-PAK 21 TAB) 10 MG (21) TBPK tablet, Take 6 tabs on day 1 Take 5 tabs on day 2 Take 4 tabs on day 3 Take 3 tabs on day 4 Take 2 tabs on day 5 Take 1 tab on day 6 (Patient not taking:  Reported on 03/06/2018), Disp: 21 tablet, Rfl: 0 .  Spacer/Aero-Holding Chambers (AEROCHAMBER MV) inhaler, Use as instructed (Patient not taking: Reported on 03/06/2018), Disp: 1 each, Rfl: 0 .  thiamine 100 MG tablet, Take by mouth., Disp: , Rfl:  .  traMADol (ULTRAM) 50 MG tablet, Take 1 tablet (50 mg total) by mouth every 6 (six) hours as needed. (Patient not taking: Reported on 03/06/2018), Disp: 20 tablet, Rfl: 0 .  venlafaxine (EFFEXOR) 37.5 MG tablet, Take by mouth., Disp: , Rfl:  .  Vitamin D, Ergocalciferol, (DRISDOL) 50000 units CAPS capsule, Take 1 capsule (50,000 Units total) by mouth once a week. For 12 weeks (Patient not taking: Reported on 12/18/2017), Disp: 12 capsule, Rfl: 0 Allergies: Patient has no known allergies.  Review of Systems  Constitutional: Negative for chills, fever and malaise/fatigue.  HENT: Negative for congestion, sinus pain and sore throat.   Eyes: Negative for blurred vision and pain.  Respiratory: Negative for cough and wheezing.   Cardiovascular: Negative for chest pain and leg swelling.  Gastrointestinal: Negative for abdominal pain, constipation, diarrhea, heartburn, nausea and vomiting.  Genitourinary: Negative for dysuria, frequency, hematuria and urgency.  Musculoskeletal: Negative for back pain, joint pain, myalgias and neck pain.  Skin: Negative for itching and rash.  Neurological: Negative for dizziness, tremors and weakness.  Endo/Heme/Allergies: Does not bruise/bleed easily.  Psychiatric/Behavioral: Negative for depression. The patient is not nervous/anxious and does not have insomnia.    Objective: BP 120/70   Ht 5\' 9"  (1.753 m)   Wt 218 lb (98.9 kg)   LMP 02/17/2018   BMI 32.19 kg/m   Filed Weights   03/06/18 1027  Weight: 218 lb (98.9 kg)   Body mass index is 32.19 kg/m. Physical Exam  Constitutional: She is oriented to person, place, and time. She appears well-developed and well-nourished. No distress.  Genitourinary: Rectum normal  and vagina normal. Pelvic exam was performed with patient supine. There is no rash or lesion on the right labia. There is no rash or lesion on the left labia. Vagina exhibits no lesion. No bleeding in the vagina. Right adnexum does not display mass and does not display tenderness. Left adnexum does not display mass and does not display tenderness. Cervix does not exhibit motion tenderness, lesion, friability or polyp.   Uterus is enlarged, mobile and retroverted. Uterus is not exhibiting a mass.  Genitourinary Comments: Difficult to assess size due to retroversion  HENT:  Head: Normocephalic and atraumatic. Head is without laceration.  Right Ear: Hearing normal.  Left Ear: Hearing normal.  Nose: No epistaxis.  No foreign bodies.  Mouth/Throat: Uvula is midline, oropharynx is clear and moist and mucous membranes are normal.  Eyes: Pupils are equal, round, and reactive to light.  Neck: Normal  range of motion. Neck supple. No thyromegaly present.  Cardiovascular: Normal rate and regular rhythm. Exam reveals no gallop and no friction rub.  No murmur heard. Pulmonary/Chest: Effort normal and breath sounds normal. No respiratory distress. She has no wheezes. Right breast exhibits no mass, no skin change and no tenderness. Left breast exhibits no mass, no skin change and no tenderness.  Abdominal: Soft. Bowel sounds are normal. She exhibits no distension. There is no tenderness. There is no rebound.  Musculoskeletal: Normal range of motion.  Neurological: She is alert and oriented to person, place, and time. No cranial nerve deficit.  Skin: Skin is warm and dry.  Psychiatric: She has a normal mood and affect. Judgment normal.  Vitals reviewed.  Assessment:  ANNUAL EXAM 1. Annual physical exam   2. Screening for cervical cancer   3. Screening for breast cancer   4. Menorrhagia with regular cycle    Screening Plan:            1.  Cervical Screening-  Pap smear done today  2. Breast screening-  Exam annually and mammogram>40 planned   3. Colonoscopy every 10 years, Hemoccult testing - after age 81  4. Labs managed by PCP  5. Counseling for contraception: bilateral tubal ligation   6. Menorrhagia with regular cycle.  Concern for size/fibroids but difficult to assess size due to retroverted uterus - Patient has abnormal uterine bleeding . She has a concern on exam today, with possible evidence of fibroid or adenomyosis.  Evaluation includes the following: exam, labs such as hormonal testing, and pelvic ultrasound to evaluate for any structural gynecologic abnormalities.  Patient to follow up after testing.  - Treatment option for menorrhagia or menometrorrhagia discussed in great detail with the patient.  Options include hormonal therapy, IUD therapy such as Mirena, D&C, Ablation, and Hysterectomy.  The pros and cons of each option discussed with patient.  Fibroid treatement also discussed.  A significant amount of additional counseling was done for this, a total of 15 minutes were spent face-to-face with the patient during this encounter and over half of that time dealt with counseling and coordination of care.  - US PELVIS TRANSVANGINAL NON-OB (TV ONLY); Future    F/U  Return in about 1 week (around 03/13/2018) for Follow up w GYN [place].  Barnett Applebaum, MD, [facility], [facility] 03/06/2018  10:40 AM

## 2018-03-06 NOTE — Patient Instructions (Signed)
PAP every three years Mammogram every year    Call [phone] to schedule at [facility] yearly (with PCP)   

## 2018-03-07 ENCOUNTER — Encounter: Payer: Self-pay | Admitting: Obstetrics & Gynecology

## 2018-03-09 LAB — CYTOLOGY - PAP
Diagnosis: NEGATIVE
HPV (WINDOPATH): NOT DETECTED

## 2018-03-14 ENCOUNTER — Ambulatory Visit: Payer: BLUE CROSS/BLUE SHIELD | Admitting: Obstetrics & Gynecology

## 2018-03-14 ENCOUNTER — Other Ambulatory Visit: Payer: BLUE CROSS/BLUE SHIELD

## 2018-03-22 ENCOUNTER — Ambulatory Visit: Payer: BLUE CROSS/BLUE SHIELD | Admitting: Obstetrics & Gynecology

## 2018-03-22 ENCOUNTER — Other Ambulatory Visit: Payer: BLUE CROSS/BLUE SHIELD

## 2018-05-02 ENCOUNTER — Encounter (INDEPENDENT_AMBULATORY_CARE_PROVIDER_SITE_OTHER): Payer: BLUE CROSS/BLUE SHIELD

## 2018-05-02 ENCOUNTER — Ambulatory Visit (INDEPENDENT_AMBULATORY_CARE_PROVIDER_SITE_OTHER): Payer: BLUE CROSS/BLUE SHIELD | Admitting: Vascular Surgery

## 2018-05-23 ENCOUNTER — Encounter (INDEPENDENT_AMBULATORY_CARE_PROVIDER_SITE_OTHER): Payer: BLUE CROSS/BLUE SHIELD

## 2018-05-23 ENCOUNTER — Ambulatory Visit (INDEPENDENT_AMBULATORY_CARE_PROVIDER_SITE_OTHER): Payer: BLUE CROSS/BLUE SHIELD | Admitting: Nurse Practitioner

## 2018-05-28 ENCOUNTER — Encounter (INDEPENDENT_AMBULATORY_CARE_PROVIDER_SITE_OTHER): Payer: Self-pay | Admitting: Nurse Practitioner

## 2018-06-11 ENCOUNTER — Telehealth: Payer: Self-pay

## 2018-06-11 NOTE — Telephone Encounter (Signed)
-----   Message from Gae Dry, MD sent at 06/11/2018 11:49 AM EST ----- Regarding: MMG Received notice she has not received MMG yet as ordered at her Annual. Please check and encourage her to do this, and document conversation.

## 2018-06-11 NOTE — Telephone Encounter (Signed)
Pt aware to schedule her mammogram 

## 2018-06-18 ENCOUNTER — Other Ambulatory Visit: Payer: Self-pay

## 2018-06-18 ENCOUNTER — Encounter: Payer: Self-pay | Admitting: Emergency Medicine

## 2018-06-18 DIAGNOSIS — Z87891 Personal history of nicotine dependence: Secondary | ICD-10-CM | POA: Diagnosis not present

## 2018-06-18 DIAGNOSIS — Z79899 Other long term (current) drug therapy: Secondary | ICD-10-CM | POA: Insufficient documentation

## 2018-06-18 DIAGNOSIS — R51 Headache: Secondary | ICD-10-CM | POA: Insufficient documentation

## 2018-06-18 DIAGNOSIS — J449 Chronic obstructive pulmonary disease, unspecified: Secondary | ICD-10-CM | POA: Insufficient documentation

## 2018-06-18 NOTE — ED Triage Notes (Signed)
Patient ambulatory to triage with steady gait, without difficulty or distress noted; pt reports right sided HA since Friday unrelieved by OTC

## 2018-06-19 ENCOUNTER — Emergency Department
Admission: EM | Admit: 2018-06-19 | Discharge: 2018-06-19 | Disposition: A | Discharge status: Home / Self Care | Payer: BLUE CROSS/BLUE SHIELD | Attending: Emergency Medicine | Admitting: Emergency Medicine

## 2018-06-19 DIAGNOSIS — R51 Headache: Secondary | ICD-10-CM

## 2018-06-19 DIAGNOSIS — R519 Headache, unspecified: Secondary | ICD-10-CM

## 2018-06-19 LAB — CBC WITH DIFFERENTIAL/PLATELET
Abs Immature Granulocytes: 0.01 10*3/uL (ref 0.00–0.07)
Basophils Absolute: 0 10*3/uL (ref 0.0–0.1)
Basophils Relative: 0 %
Eosinophils Absolute: 0.2 10*3/uL (ref 0.0–0.5)
Eosinophils Relative: 3 %
HCT: 39.4 % (ref 36.0–46.0)
Hemoglobin: 12.4 g/dL (ref 12.0–15.0)
Immature Granulocytes: 0 %
Lymphocytes Relative: 47 %
Lymphs Abs: 2.4 10*3/uL (ref 0.7–4.0)
MCH: 28.8 pg (ref 26.0–34.0)
MCHC: 31.5 g/dL (ref 30.0–36.0)
MCV: 91.4 fL (ref 80.0–100.0)
Monocytes Absolute: 0.4 10*3/uL (ref 0.1–1.0)
Monocytes Relative: 8 %
NRBC: 0 % (ref 0.0–0.2)
Neutro Abs: 2.2 10*3/uL (ref 1.7–7.7)
Neutrophils Relative %: 42 %
Platelets: 174 10*3/uL (ref 150–400)
RBC: 4.31 MIL/uL (ref 3.87–5.11)
RDW: 13.2 % (ref 11.5–15.5)
WBC: 5.3 10*3/uL (ref 4.0–10.5)

## 2018-06-19 LAB — BASIC METABOLIC PANEL
Anion gap: 5 (ref 5–15)
BUN: 15 mg/dL (ref 6–20)
CALCIUM: 8.6 mg/dL — AB (ref 8.9–10.3)
CO2: 24 mmol/L (ref 22–32)
Chloride: 108 mmol/L (ref 98–111)
Creatinine, Ser: 0.57 mg/dL (ref 0.44–1.00)
GFR calc Af Amer: >60 mL/min (ref >60)
GFR calc non Af Amer: >60 mL/min (ref >60)
Glucose, Bld: 91 mg/dL (ref 70–99)
Potassium: 3.1 mmol/L — ABNORMAL LOW (ref 3.5–5.1)
Sodium: 137 mmol/L (ref 135–145)

## 2018-06-19 LAB — MAGNESIUM: Magnesium: 2.1 mg/dL (ref 1.7–2.4)

## 2018-06-19 MED ORDER — KETOROLAC TROMETHAMINE 30 MG/ML IJ SOLN
15.0000 mg | Freq: Once | INTRAMUSCULAR | Status: AC
  Administered 2018-06-19: 15 mg via INTRAVENOUS
  Filled 2018-06-19: qty 1

## 2018-06-19 MED ORDER — METOCLOPRAMIDE HCL 5 MG/ML IJ SOLN
10.0000 mg | INTRAMUSCULAR | Status: AC
  Administered 2018-06-19: 10 mg via INTRAVENOUS
  Filled 2018-06-19: qty 2

## 2018-06-19 MED ORDER — DEXAMETHASONE SODIUM PHOSPHATE 10 MG/ML IJ SOLN
10.0000 mg | Freq: Once | INTRAMUSCULAR | Status: AC
  Administered 2018-06-19: 10 mg via INTRAVENOUS
  Filled 2018-06-19: qty 1

## 2018-06-19 MED ORDER — DIPHENHYDRAMINE HCL 50 MG/ML IJ SOLN
12.5000 mg | INTRAMUSCULAR | Status: AC
  Administered 2018-06-19: 12.5 mg via INTRAVENOUS
  Filled 2018-06-19: qty 1

## 2018-06-19 MED ORDER — SODIUM CHLORIDE 0.9 % IV BOLUS
500.0000 mL | Freq: Once | INTRAVENOUS | Status: AC
  Administered 2018-06-19: 500 mL via INTRAVENOUS

## 2018-06-19 NOTE — Discharge Instructions (Addendum)

## 2018-06-19 NOTE — ED Provider Notes (Signed)
[facility] Emergency Department Provider Note  ____________________________________________   First MD Initiated Contact with Patient 06/19/18 0304     (approximate)  I have reviewed the triage vital signs and the nursing notes.   HISTORY  Chief Complaint Headache    HPI Kara West is a 42 y.o. female with chronic medical issues as listed below but which also includes intermittent but recurrent headaches.  She presents per private vehicle for evaluation of a headache for 4 to 5 days.  She states that it started rather abruptly on Friday and has been constant, not getting better but not getting worse.  Tylenol, ibuprofen, Excedrin, etc. has not helped.  She states that is primarily on the right side of her head and a severe sharp throbbing pain that radiates from the front to the back.  The light makes the pain worse as does exertion, rest in dark rooms makes it a little bit better.  No aura.  Feels similar to prior headaches but it is atypical that the over-the-counter medicine is not helping.  Some mild nausea, no vomiting.  No history of fever, chest pain, shortness of breath, abdominal pain, nor dysuria.    Past Medical History:  Diagnosis Date  . Anemia    s/p transfusion  . Bronchitis   . COPD (chronic obstructive pulmonary disease) ([place])   . GERD (gastroesophageal reflux disease)   . History of Papanicolaou smear of cervix 10/12/2011   neg    Patient Active Problem List   Diagnosis Date Noted  . Menorrhagia with regular cycle 03/06/2018  . Bilateral lower extremity edema 12/18/2017  . Bilateral cellulitis of lower leg 12/18/2017  . Paresthesia of both feet 02/16/2017  . Moderate Persistent Asthma 02/18/2015  . Allergic rhinitis 02/18/2015  . COPD exacerbation ([place]) 12/01/2014    Past Surgical History:  Procedure Laterality Date  . CESAREAN SECTION     x3  . DILATION AND CURETTAGE OF UTERUS  11/11/2011   hysteroscopy  .  ENDOMETRIAL ABLATION  11/11/2011  . TUBAL LIGATION  1997    Prior to Admission medications   Medication Sig Start Date End Date Taking? Authorizing Provider  albuterol (PROVENTIL HFA;VENTOLIN HFA) 108 (90 Base) MCG/ACT inhaler Inhale 2 puffs into the lungs every 6 (six) hours as needed for wheezing or shortness of breath. Patient not taking: Reported on 03/06/2018 06/17/16   Johnn Hai, PA-C  albuterol-ipratropium [facility]) 18-103 MCG/ACT inhaler Inhale 2 puffs into the lungs 4 (four) times daily as needed for wheezing. 09/15/15 09/14/16  Sherrie George B, FNP  brompheniramine-pseudoephedrine-DM 30-2-10 MG/5ML syrup Take 10 mLs by mouth 4 (four) times daily as needed. Patient not taking: Reported on 12/18/2017 03/26/17   Cuthriell, Charline Bills, PA-C  cephALEXin (KEFLEX) 500 MG capsule Take 1 capsule (500 mg total) by mouth 4 (four) times daily. Patient not taking: Reported on 03/06/2018 12/18/17   Stegmayer, Janalyn Harder, PA-C  diclofenac (VOLTAREN) 75 MG EC tablet Take 1 tablet (75 mg total) by mouth 2 (two) times daily as needed. Patient not taking: Reported on 12/18/2017 03/09/17   Hubbard Hartshorn, FNP  ferrous sulfate 325 (65 FE) MG tablet Take by mouth.    [provider]  gabapentin (NEURONTIN) 100 MG capsule Take 1 capsule (100 mg total) by mouth 3 (three) times daily. Patient not taking: Reported on 03/06/2018 02/16/17   Roselee Nova, MD  ibuprofen (ADVIL,MOTRIN) 600 MG tablet Take 1 tablet (600 mg total) by mouth every 6 (  six) hours as needed. Patient not taking: Reported on 12/18/2017 11/19/17   Arta Silence, MD  LYRICA 75 MG capsule  10/03/17   [provider]  predniSONE (DELTASONE) 50 MG tablet Take 1 tablet (50 mg total) by mouth daily with breakfast. Patient not taking: Reported on 12/18/2017 03/26/17   Cuthriell, Charline Bills, PA-C  predniSONE (STERAPRED UNI-PAK 21 TAB) 10 MG (21) TBPK tablet Take 6 tabs on day 1 Take 5 tabs on day 2 Take 4 tabs on day 3 Take 3  tabs on day 4 Take 2 tabs on day 5 Take 1 tab on day 6 Patient not taking: Reported on 03/06/2018 05/24/17   Loney Hering, MD  Spacer/Aero-Holding Chambers (AEROCHAMBER MV) inhaler Use as instructed Patient not taking: Reported on 03/06/2018 02/18/15   Juanito Doom, MD  thiamine 100 MG tablet Take by mouth.    [provider]  traMADol (ULTRAM) 50 MG tablet Take 1 tablet (50 mg total) by mouth every 6 (six) hours as needed. Patient not taking: Reported on 03/06/2018 12/08/17 12/08/18  Sable Feil, PA-C  venlafaxine [facility]) 37.5 MG tablet Take by mouth. 08/07/17 02/03/18  [provider]  Vitamin D, Ergocalciferol, (DRISDOL) 50000 units CAPS capsule Take 1 capsule (50,000 Units total) by mouth once a week. For 12 weeks Patient not taking: Reported on 12/18/2017 02/17/17   Roselee Nova, MD    Allergies Patient has no known allergies.  Family History  Problem Relation Age of Onset  . Hypertension Mother   . Diabetes Mother     Social History Social History   Tobacco Use  . Smoking status: Former Smoker    Packs/day: 0.20    Years: 5.00    Pack years: 1.00    Types: Cigarettes    Last attempt to quit: 06/11/2014    Years since quitting: 4.0  . Smokeless tobacco: Never Used  Substance Use Topics  . Alcohol use: Yes    Alcohol/week: 0.0 standard drinks    Comment: occ  . Drug use: No    Review of Systems Constitutional: No fever/chills Eyes: No visual changes. ENT: No sore throat. Cardiovascular: Denies chest pain. Respiratory: Denies shortness of breath. Gastrointestinal: No abdominal pain.  Mild nausea, no vomiting.  No diarrhea.  No constipation. Genitourinary: Negative for dysuria. Musculoskeletal: Negative for neck pain.  Negative for back pain. Integumentary: Negative for rash. Neurological: Right-sided throbbing sharp severe headache as described above.  No focal numbness nor  weakness.   ____________________________________________   PHYSICAL EXAM:  VITAL SIGNS: ED Triage Vitals  Enc Vitals Group     BP 06/18/18 2220 116/73     Pulse Rate 06/18/18 2220 65     Resp 06/18/18 2220 16     Temp 06/18/18 2220 98 F (36.7 C)     Temp Source 06/18/18 2220 Oral     SpO2 06/18/18 2220 97 %     Weight 06/18/18 2220 95.3 kg (210 lb)     Height 06/18/18 2220 1.753 m (5\' 9" )     Head Circumference --      Peak Flow --      Pain Score 06/18/18 2223 10     Pain Loc --      Pain Edu? --      Excl. in [place]? --     Constitutional: Alert and oriented.  Appears uncomfortable but is not in acute distress. Eyes: Conjunctivae are normal. PERRL. EOMI. Head: Atraumatic. Nose: No congestion/rhinnorhea. Mouth/Throat: Mucous  membranes are moist. Neck: No stridor.  No meningeal signs.   Cardiovascular: Normal rate, regular rhythm. Good peripheral circulation. Grossly normal heart sounds. Respiratory: Normal respiratory effort.  No retractions. Lungs CTAB. Gastrointestinal: Soft and nontender. No distention.  Musculoskeletal: No lower extremity tenderness nor edema. No gross deformities of extremities. Neurologic:  Normal speech and language. No gross focal neurologic deficits are appreciated.  Skin:  Skin is warm, dry and intact. No rash noted. Psychiatric: Mood and affect are normal. Speech and behavior are normal.  ____________________________________________   LABS (all labs ordered are listed, but only abnormal results are displayed)  Labs Reviewed  BASIC METABOLIC PANEL - Abnormal; Notable for the following components:      Result Value   Potassium 3.1 (*)    Calcium 8.6 (*)    All other components within normal limits  CBC WITH DIFFERENTIAL/PLATELET  MAGNESIUM   ____________________________________________  EKG  None - EKG not ordered by ED physician ____________________________________________  RADIOLOGY   ED MD interpretation: No indication for  imaging  Official radiology report(s): No results found.  ____________________________________________   PROCEDURES  Critical Care performed: No   Procedure(s) performed:   Procedures   ____________________________________________   INITIAL IMPRESSION / ASSESSMENT AND PLAN / ED COURSE  As part of my medical decision making, I reviewed the following data within the [facility] notes reviewed and incorporated, Labs reviewed , Old chart reviewed and Notes from prior ED visits    Differential diagnosis includes, but is not limited to, intracranial hemorrhage, meningitis/encephalitis, previous head trauma, cavernous venous thrombosis, tension headache, temporal arteritis, migraine or migraine equivalent, idiopathic intracranial hypertension, and non-specific headache.  Based on the fact the patient has had her symptoms which have not gotten any worse but also not improved over for 5 days, I think it is most likely she has a persistent migraine.  Intracranial bleeding is unlikely given the duration of symptoms and she has no focal neurological deficits.  Her headache feels similar to prior headaches.  I reviewed her medical record and saw that she has issues with neuropathy and she has seen Dr. Manuella Ghazi in the past.  She had a brain MRI about 11 months ago which was reassuring except for a small meningioma.  Her vital signs are currently stable and she is afebrile.  I will check basic labs given her history of anemia to make sure she is not substantially anemic which could theoretically also cause a headache, but anticipate her labs will likely be at baseline.  I am treating her with a usual migraine cocktail: Normal saline 500 mL bolus IV, Toradol 15 mg IV, Benadryl 12.5 mg IV, Decadron 10 mg IV, Reglan 10 mg IV.  I will then reassess.  I explained my plan to the patient and why I did not think imaging was necessary and she understands and agrees with the  plan.  Clinical Course as of Jun 19 650  Tue Jun 19, 2018  0406 Normal hemoglobin  Hemoglobin: 12.4 [CF]  4765 Basic metabolic panel notable only for a decreased potassium of 3.1 of unclear significance  Basic metabolic panel(!) [CF]  4650 The patient is feeling much better and is ready to go home.  I gave my usual and customary return precautions.   [CF]    Clinical Course User Index [CF] Hinda Kehr, MD    ____________________________________________  FINAL CLINICAL IMPRESSION(S) / ED DIAGNOSES  Final diagnoses:  Acute nonintractable headache, unspecified headache type  MEDICATIONS GIVEN DURING THIS VISIT:  Medications  ketorolac (TORADOL) 30 MG/ML injection 15 mg (15 mg Intravenous Given 06/19/18 0341)  sodium chloride 0.9 % bolus 500 mL (0 mLs Intravenous Stopped 06/19/18 0437)  dexamethasone (DECADRON) injection 10 mg (10 mg Intravenous Given 06/19/18 0342)  metoCLOPramide (REGLAN) injection 10 mg (10 mg Intravenous Given 06/19/18 0343)  diphenhydrAMINE (BENADRYL) injection 12.5 mg (12.5 mg Intravenous Given 06/19/18 0344)     ED Discharge Orders    None       Note:  This document was prepared using Dragon voice recognition software and may include unintentional dictation errors.    Hinda Kehr, MD 06/19/18 [phone]

## 2018-07-04 ENCOUNTER — Other Ambulatory Visit: Payer: Self-pay | Admitting: Family Medicine

## 2018-07-04 DIAGNOSIS — D329 Benign neoplasm of meninges, unspecified: Secondary | ICD-10-CM

## 2018-07-05 ENCOUNTER — Ambulatory Visit
Admission: RE | Admit: 2018-07-05 | Discharge: 2018-07-05 | Disposition: A | Discharge status: Home / Self Care | Payer: BLUE CROSS/BLUE SHIELD | Source: Ambulatory Visit | Attending: Obstetrics & Gynecology | Admitting: Obstetrics & Gynecology

## 2018-07-05 DIAGNOSIS — Z1239 Encounter for other screening for malignant neoplasm of breast: Secondary | ICD-10-CM | POA: Diagnosis not present

## 2018-07-05 DIAGNOSIS — Z1231 Encounter for screening mammogram for malignant neoplasm of breast: Secondary | ICD-10-CM | POA: Diagnosis not present

## 2018-07-11 ENCOUNTER — Ambulatory Visit (INDEPENDENT_AMBULATORY_CARE_PROVIDER_SITE_OTHER): Payer: BLUE CROSS/BLUE SHIELD

## 2018-07-11 ENCOUNTER — Encounter (INDEPENDENT_AMBULATORY_CARE_PROVIDER_SITE_OTHER): Payer: Self-pay | Admitting: Nurse Practitioner

## 2018-07-11 ENCOUNTER — Ambulatory Visit (INDEPENDENT_AMBULATORY_CARE_PROVIDER_SITE_OTHER): Payer: BLUE CROSS/BLUE SHIELD | Admitting: Nurse Practitioner

## 2018-07-11 VITALS — BP 114/77 | HR 91 | Resp 16 | Ht 69.0 in | Wt 213.2 lb

## 2018-07-11 DIAGNOSIS — M79605 Pain in left leg: Secondary | ICD-10-CM

## 2018-07-11 DIAGNOSIS — I890 Lymphedema, not elsewhere classified: Secondary | ICD-10-CM | POA: Insufficient documentation

## 2018-07-11 DIAGNOSIS — M79604 Pain in right leg: Secondary | ICD-10-CM

## 2018-07-11 DIAGNOSIS — R600 Localized edema: Secondary | ICD-10-CM | POA: Diagnosis not present

## 2018-07-11 DIAGNOSIS — J45909 Unspecified asthma, uncomplicated: Secondary | ICD-10-CM | POA: Diagnosis not present

## 2018-07-11 DIAGNOSIS — M79606 Pain in leg, unspecified: Secondary | ICD-10-CM | POA: Insufficient documentation

## 2018-07-11 NOTE — Progress Notes (Signed)
Subjective:    Patient ID: Kara West, female    DOB: [birth year], 43 y.o.   MRN: [id number] Chief Complaint  Patient presents with  . Follow-up    HPI  Kara West is a 43 y.o. female there is following up today with her lower extremity swelling as well as leg pain.  The patient reports that with usage of elevation and medical grade 1 compression stockings the swelling has reduced.  She has not had any other episodes of cellulitis of her bilateral lower extremities.  Her biggest concern right now is a constant throbbing pain which is lifestyle limiting.  She states that this is been occurring "for some time".  But it has generally been greater than 6 months.  She states that sometimes the pain is worse with ambulation however she also does get numbness of her lower extremities when she is laying flat.  She often has to stop and wait before rising to be able to walk.  She denies any fever, chills, nausea, vomiting or diarrhea.  She denies any chest pain shortness of breath.  She has no previous history of vascular interventions.  She denies any lower back pain.  She underwent a lower extremity reflux study which revealed no evidence of DVT bilaterally.  No evidence of superficial venous thrombosis.  There is no evidence of chronic venous insufficiency.  It was also noted that the small saphenous vein is dilated however there is no reflux noted.  Past Medical History:  Diagnosis Date  . Anemia    s/p transfusion  . Bronchitis   . COPD (chronic obstructive pulmonary disease) ([place])   . GERD (gastroesophageal reflux disease)   . History of Papanicolaou smear of cervix 10/12/2011   neg    Past Surgical History:  Procedure Laterality Date  . CESAREAN SECTION     x3  . DILATION AND CURETTAGE OF UTERUS  11/11/2011   hysteroscopy  . ENDOMETRIAL ABLATION  11/11/2011  . TUBAL LIGATION  1997    Social History   Socioeconomic History  . Marital status: Legally Separated     Spouse name: Not on file  . Number of children: Not on file  . Years of education: Not on file  . Highest education level: Not on file  Occupational History  . Not on file  Social Needs  . Financial resource strain: Not on file  . Food insecurity:    Worry: Not on file    Inability: Not on file  . Transportation needs:    Medical: Not on file    Non-medical: Not on file  Tobacco Use  . Smoking status: Former Smoker    Packs/day: 0.20    Years: 5.00    Pack years: 1.00    Types: Cigarettes    Last attempt to quit: 06/11/2014    Years since quitting: 4.0  . Smokeless tobacco: Never Used  Substance and Sexual Activity  . Alcohol use: Yes    Alcohol/week: 0.0 standard drinks    Comment: occ  . Drug use: No  . Sexual activity: Yes    Birth control/protection: Surgical    Comment: tubal  Lifestyle  . Physical activity:    Days per week: Not on file    Minutes per session: Not on file  . Stress: Not on file  Relationships  . Social connections:    Talks on phone: Not on file    Gets together: Not on file    Attends religious service:  Not on file    Active member of club or organization: Not on file    Attends meetings of clubs or organizations: Not on file    Relationship status: Not on file  . Intimate partner violence:    Fear of current or ex partner: Not on file    Emotionally abused: Not on file    Physically abused: Not on file    Forced sexual activity: Not on file  Other Topics Concern  . Not on file  Social History Narrative  . Not on file    Family History  Problem Relation Age of Onset  . Hypertension Mother   . Diabetes Mother     No Known Allergies   Review of Systems   Review of Systems: Negative Unless Checked Constitutional: [] Weight loss  [] Fever  [] Chills Cardiac: [] Chest pain   []  Atrial Fibrillation  [] Palpitations   [] Shortness of breath when laying flat   [] Shortness of breath with exertion. [] Shortness of breath at  rest Vascular:  [x] Pain in legs with walking   [] Pain in legs with standing [x] Pain in legs when laying flat   [] Claudication    [] Pain in feet when laying flat    [] History of DVT   [] Phlebitis   [] Swelling in legs   [] Varicose veins   [] Non-healing ulcers Pulmonary:   [] Uses home oxygen   [] Productive cough   [] Hemoptysis   [] Wheeze  [] COPD   [] Asthma Neurologic:  [] Dizziness   [] Seizures  [] Blackouts [] History of stroke   [] History of TIA  [] Aphasia   [] Temporary Blindness   [] Weakness or numbness in arm   [x] Weakness or numbness in leg Musculoskeletal:   [] Joint swelling   [] Joint pain   [] Low back pain  []  History of Knee Replacement [] Arthritis [] back Surgeries  []  Spinal Stenosis    Hematologic:  [] Easy bruising  [] Easy bleeding   [] Hypercoagulable state   [] Anemic Gastrointestinal:  [] Diarrhea   [] Vomiting  [] Gastroesophageal reflux/heartburn   [] Difficulty swallowing. [] Abdominal pain Genitourinary:  [] Chronic kidney disease   [] Difficult urination  [] Anuric   [] Blood in urine [] Frequent urination  [] Burning with urination   [] Hematuria Skin:  [] Rashes   [] Ulcers [] Wounds Psychological:  [] History of anxiety   []  History of major depression  []  Memory Difficulties     Objective:   Physical Exam  BP 114/77 (BP Location: Right Arm, Patient Position: Sitting, Cuff Size: Normal)   Pulse 91   Resp 16   Ht 5\' 9"  (1.753 m)   Wt 213 lb 3.2 oz (96.7 kg)   LMP 07/03/2018   BMI 31.48 kg/m   Gen: WD/WN, NAD Head: [place]/AT, No temporalis wasting.  Ear/Nose/Throat: Hearing grossly intact, nares w/o erythema or drainage Eyes: PER, EOMI, sclera nonicteric.  Neck: Supple, no masses.  No JVD.  Pulmonary:  Good air movement, no use of accessory muscles.  Cardiac: RRR Vascular:  2+ soft edema Vessel Right Left  Radial Palpable Palpable  Dorsalis Pedis Trace Palpable Palpable  Posterior Tibial Not Palpable Trace Palpable   Gastrointestinal: soft, non-distended. No guarding/no peritoneal signs.   Musculoskeletal: M/S 5/5 throughout.  No deformity or atrophy.  Neurologic: Pain and light touch intact in extremities.  Symmetrical.  Speech is fluent. Motor exam as listed above. Psychiatric: Judgment intact, Mood & affect appropriate for pt's clinical situation. Dermatologic:  Bilateral stasis dermatitis. No Ulcers Noted.  No changes consistent with cellulitis. Lymph : No Cervical lymphadenopathy, dermal thickening present bilaterally      Assessment & Plan:  1. Moderate Persistent Asthma Continue pulmonary medications and aerosols as already ordered, these medications have been reviewed and there are no changes at this time.    2. Pain in both lower extremities  Recommend:  The patient has atypical pain symptoms for pure atherosclerotic disease. However, on physical exam there is evidence of mixed venous and arterial disease, given the diminished pulses and the edema associated with venous changes of the legs.  Noninvasive studies including ABI's will be obtained and the patient will follow up with me to review these studies.  The patient should continue walking and begin a more formal exercise program. The patient should continue his antiplatelet therapy and aggressive treatment of the lipid abnormalities.  The patient should begin continue graduated compression socks 15-20 mmHg strength to control edema.  - VAS [place] ABI WITH/WO TBI; Future  3. Lymphedema Recommend:  No surgery or intervention at this point in time.    I have reviewed my previous discussion with the patient regarding swelling and why it causes symptoms.  Patient will continue wearing graduated compression stockings class 1 (20-30 mmHg) on a daily basis. The patient will  beginning wearing the stockings first thing in the morning and removing them in the evening. The patient is instructed specifically not to sleep in the stockings.    In addition, behavioral modification including several periods of elevation  of the lower extremities during the day will be continued.  This was reviewed with the patient during the initial visit.  The patient will also continue routine exercise, especially walking on a daily basis as was discussed during the initial visit.    Despite conservative treatments including graduated compression therapy class 1 and behavioral modification including exercise and elevation the patient  has not obtained adequate control of the lymphedema.  The patient still has stage 3 lymphedema and therefore, I believe that a lymph pump should be added to improve the control of the patient's lymphedema.  Additionally, a lymph pump is warranted because it will reduce the risk of cellulitis and ulceration in the future.      Current Outpatient Medications on File Prior to Visit  Medication Sig Dispense Refill  . albuterol (PROVENTIL HFA;VENTOLIN HFA) 108 (90 Base) MCG/ACT inhaler Inhale 2 puffs into the lungs every 6 (six) hours as needed for wheezing or shortness of breath. 1 Inhaler 2  . albuterol-ipratropium (COMBIVENT) 18-103 MCG/ACT inhaler Inhale 2 puffs into the lungs 4 (four) times daily as needed for wheezing. 1 Inhaler 2  . LYRICA 75 MG capsule   0  . ferrous sulfate 325 (65 FE) MG tablet Take by mouth.    . gabapentin (NEURONTIN) 100 MG capsule Take 1 capsule (100 mg total) by mouth 3 (three) times daily. (Patient not taking: Reported on 03/06/2018) 90 capsule 0  . predniSONE (DELTASONE) 50 MG tablet Take 1 tablet (50 mg total) by mouth daily with breakfast. (Patient not taking: Reported on 12/18/2017) 5 tablet 0  . Spacer/Aero-Holding Chambers (AEROCHAMBER MV) inhaler Use as instructed (Patient not taking: Reported on 03/06/2018) 1 each 0  . thiamine 100 MG tablet Take by mouth.    . traMADol (ULTRAM) 50 MG tablet Take 1 tablet (50 mg total) by mouth every 6 (six) hours as needed. (Patient not taking: Reported on 03/06/2018) 20 tablet 0  . venlafaxine (EFFEXOR) 37.5 MG tablet Take by  mouth.     No current facility-administered medications on file prior to visit.     There are no Patient Instructions  on file for this visit. No follow-ups on file.   Kris Hartmann, NP  This note was completed with Sales executive.  Any errors are purely unintentional.

## 2018-07-13 ENCOUNTER — Emergency Department: Payer: BLUE CROSS/BLUE SHIELD

## 2018-07-13 ENCOUNTER — Other Ambulatory Visit: Payer: Self-pay

## 2018-07-13 ENCOUNTER — Encounter: Payer: Self-pay | Admitting: Emergency Medicine

## 2018-07-13 ENCOUNTER — Emergency Department
Admission: EM | Admit: 2018-07-13 | Discharge: 2018-07-13 | Disposition: A | Discharge status: Home / Self Care | Payer: BLUE CROSS/BLUE SHIELD | Attending: Emergency Medicine | Admitting: Emergency Medicine

## 2018-07-13 DIAGNOSIS — Z87891 Personal history of nicotine dependence: Secondary | ICD-10-CM | POA: Diagnosis not present

## 2018-07-13 DIAGNOSIS — J449 Chronic obstructive pulmonary disease, unspecified: Secondary | ICD-10-CM | POA: Insufficient documentation

## 2018-07-13 DIAGNOSIS — Z79899 Other long term (current) drug therapy: Secondary | ICD-10-CM | POA: Insufficient documentation

## 2018-07-13 DIAGNOSIS — J9801 Acute bronchospasm: Secondary | ICD-10-CM | POA: Diagnosis not present

## 2018-07-13 DIAGNOSIS — R05 Cough: Secondary | ICD-10-CM | POA: Diagnosis not present

## 2018-07-13 MED ORDER — METHYLPREDNISOLONE 4 MG PO TBPK: ORAL_TABLET | ORAL | 0 refills | Status: DC

## 2018-07-13 MED ORDER — BENZONATATE 100 MG PO CAPS: 200.0000 mg | ORAL_CAPSULE | Freq: Three times a day (TID) | ORAL | 0 refills | Status: DC | PRN

## 2018-07-13 MED ORDER — HYDROCOD POLST-CPM POLST ER 10-8 MG/5ML PO SUER: 5.0000 mL | Freq: Two times a day (BID) | ORAL | 0 refills | Status: DC

## 2018-07-13 MED ORDER — IPRATROPIUM-ALBUTEROL 0.5-2.5 (3) MG/3ML IN SOLN
3.0000 mL | Freq: Once | RESPIRATORY_TRACT | Status: AC
  Administered 2018-07-13: 3 mL via RESPIRATORY_TRACT
  Filled 2018-07-13: qty 3

## 2018-07-13 MED ORDER — METHYLPREDNISOLONE SODIUM SUCC 125 MG IJ SOLR
125.0000 mg | Freq: Once | INTRAMUSCULAR | Status: AC
  Administered 2018-07-13: 125 mg via INTRAMUSCULAR
  Filled 2018-07-13: qty 2

## 2018-07-13 MED ORDER — BENZONATATE 100 MG PO CAPS
200.0000 mg | ORAL_CAPSULE | Freq: Once | ORAL | Status: AC
  Administered 2018-07-13: 200 mg via ORAL
  Filled 2018-07-13: qty 2

## 2018-07-13 NOTE — ED Notes (Signed)
Patient verbalized understanding of discharge instructions, no questions. Patient ambulated out of ED with steady gait in no distress.  

## 2018-07-13 NOTE — ED Notes (Signed)
See triage note  Presents with cough    States she developed body aches and chills on Sunday  States body aches and chills stopped but conts to have cough    Feels like she is not able to cough anything up   Min relief with using SVN and inhlaers

## 2018-07-13 NOTE — ED Provider Notes (Signed)
[facility] Emergency Department Provider Note   ____________________________________________   First MD Initiated Contact with Patient 07/13/18 1056     (approximate)  I have reviewed the triage vital signs and the nursing notes.   HISTORY  Chief Complaint Cough    HPI Kara West is a 43 y.o. female patient presents with 1 week of productive cough and chest congestion.  Patient state sputum is yellow in color.  Patient also complain of wheezing.  Patient has history of COPD.  Patient denies fever but state has chills.  Patient say foul taste in her mouth.  Patient denies vomiting or diarrhea.  Patient state cough is refractory to over-the-counter cough preparations.  Patient state mild relief with her inhaler.  Patient denies chest pain.  Past Medical History:  Diagnosis Date  . Anemia    s/p transfusion  . Bronchitis   . COPD (chronic obstructive pulmonary disease) ([place])   . GERD (gastroesophageal reflux disease)   . History of Papanicolaou smear of cervix 10/12/2011   neg    Patient Active Problem List   Diagnosis Date Noted  . Leg pain 07/11/2018  . Lymphedema 07/11/2018  . Menorrhagia with regular cycle 03/06/2018  . Bilateral lower extremity edema 12/18/2017  . Bilateral cellulitis of lower leg 12/18/2017  . Paresthesia of both feet 02/16/2017  . Moderate Persistent Asthma 02/18/2015  . Allergic rhinitis 02/18/2015  . COPD exacerbation ([place]) 12/01/2014    Past Surgical History:  Procedure Laterality Date  . CESAREAN SECTION     x3  . DILATION AND CURETTAGE OF UTERUS  11/11/2011   hysteroscopy  . ENDOMETRIAL ABLATION  11/11/2011  . TUBAL LIGATION  1997    Prior to Admission medications   Medication Sig Start Date End Date Taking? Authorizing Provider  albuterol (PROVENTIL HFA;VENTOLIN HFA) 108 (90 Base) MCG/ACT inhaler Inhale 2 puffs into the lungs every 6 (six) hours as needed for wheezing or shortness of breath.  06/17/16   Johnn Hai, PA-C  albuterol-ipratropium (COMBIVENT) 18-103 MCG/ACT inhaler Inhale 2 puffs into the lungs 4 (four) times daily as needed for wheezing. 09/15/15 07/11/18  Triplett, Johnette Abraham B, FNP  benzonatate (TESSALON PERLES) 100 MG capsule Take 2 capsules (200 mg total) by mouth 3 (three) times daily as needed. 07/13/18 07/13/19  Sable Feil, PA-C  chlorpheniramine-HYDROcodone (TUSSIONEX PENNKINETIC ER) 10-8 MG/5ML SUER Take 5 mLs by mouth 2 (two) times daily. 07/13/18   Sable Feil, PA-C  ferrous sulfate 325 (65 FE) MG tablet Take by mouth.    [provider]  gabapentin (NEURONTIN) 100 MG capsule Take 1 capsule (100 mg total) by mouth 3 (three) times daily. Patient not taking: Reported on 03/06/2018 02/16/17   Roselee Nova, MD  LYRICA 75 MG capsule  10/03/17   [provider]  methylPREDNISolone (MEDROL DOSEPAK) 4 MG TBPK tablet Take Tapered dose as directed 07/13/18   Sable Feil, PA-C  predniSONE (DELTASONE) 50 MG tablet Take 1 tablet (50 mg total) by mouth daily with breakfast. Patient not taking: Reported on 12/18/2017 03/26/17   Cuthriell, Charline Bills, PA-C  Spacer/Aero-Holding Chambers (AEROCHAMBER MV) inhaler Use as instructed Patient not taking: Reported on 03/06/2018 02/18/15   Juanito Doom, MD  thiamine 100 MG tablet Take by mouth.    [provider]  traMADol (ULTRAM) 50 MG tablet Take 1 tablet (50 mg total) by mouth every 6 (six) hours as needed. Patient not taking: Reported on 03/06/2018 12/08/17 12/08/18  Sable Feil, PA-C  venlafaxine [facility]) 37.5 MG tablet Take by mouth. 08/07/17 02/03/18  [provider]    Allergies Patient has no known allergies.  Family History  Problem Relation Age of Onset  . Hypertension Mother   . Diabetes Mother     Social History Social History   Tobacco Use  . Smoking status: Former Smoker    Packs/day: 0.20    Years: 5.00    Pack years: 1.00    Types: Cigarettes    Last  attempt to quit: 06/11/2014    Years since quitting: 4.0  . Smokeless tobacco: Never Used  Substance Use Topics  . Alcohol use: Yes    Alcohol/week: 0.0 standard drinks    Comment: occ  . Drug use: No    Review of Systems Constitutional: No fever/chills Eyes: No visual changes. ENT: No sore throat. Cardiovascular: Denies chest pain. Respiratory: Productive cough and wheezing. Gastrointestinal: No abdominal pain.  No nausea, no vomiting.  No diarrhea.  No constipation. Genitourinary: Negative for dysuria. Musculoskeletal: Negative for back pain. Skin: Negative for rash. Neurological: Negative for headaches, focal weakness or numbness.   ____________________________________________   PHYSICAL EXAM:  VITAL SIGNS: ED Triage Vitals  Enc Vitals Group     BP 07/13/18 1024 112/75     Pulse Rate 07/13/18 1024 78     Resp --      Temp 07/13/18 1024 98.3 F (36.8 C)     Temp Source 07/13/18 1024 Oral     SpO2 07/13/18 1024 96 %     Weight 07/13/18 1025 213 lb (96.6 kg)     Height 07/13/18 1025 5\' 9"  (1.753 m)     Head Circumference --      Peak Flow --      Pain Score 07/13/18 1037 0     Pain Loc --      Pain Edu? --      Excl. in [place]? --    Constitutional: Alert and oriented. Well appearing and in no acute distress. Nose: No congestion/rhinnorhea. Mouth/Throat: Mucous membranes are moist.  Oropharynx non-erythematous. Neck: No stridor.  Hematological/Lymphatic/Immunilogical: No cervical lymphadenopathy. Cardiovascular: Normal rate, regular rhythm. Grossly normal heart sounds.  Good peripheral circulation. Respiratory: Normal respiratory effort.  No retractions. Lungs bilateral wheezing. Gastrointestinal: Soft and nontender. No distention. No abdominal bruits. No CVA tenderness. Neurologic:  Normal speech and language. No gross focal neurologic deficits are appreciated. No gait instability. Skin:  Skin is warm, dry and intact. No rash noted. Psychiatric: Mood and affect  are normal. Speech and behavior are normal.  ____________________________________________   LABS (all labs ordered are listed, but only abnormal results are displayed)  Labs Reviewed - No data to display ____________________________________________  EKG   ____________________________________________  RADIOLOGY  ED MD interpretation:    Official radiology report(s): Dg Chest 2 View  Result Date: 07/13/2018 CLINICAL DATA:  Productive cough EXAM: CHEST - 2 VIEW COMPARISON:  09/06/2016 FINDINGS: Normal heart size and mediastinal contours. No acute infiltrate or edema. No effusion or pneumothorax. No acute osseous findings. IMPRESSION: Negative chest. Electronically Signed   By: Monte Fantasia M.D.   On: 07/13/2018 11:43    ____________________________________________   PROCEDURES  Procedure(s) performed: None  Procedures  Critical Care performed: No  ____________________________________________   INITIAL IMPRESSION / ASSESSMENT AND PLAN / ED COURSE  As part of my medical decision making, I reviewed the following data within the [place]    Patient presents with cough  congestion for 2 to 3 days.  Discussed negative chest x-ray findings.  Patient has history of COPD.  Patient is treated for cough secondary bronchospasm.  Patient given discharge care instruction work note.  Patient advised to follow-up with PCP if complaints persist.  Return back to ED if condition worsens.      ____________________________________________   FINAL CLINICAL IMPRESSION(S) / ED DIAGNOSES  Final diagnoses:  Cough due to bronchospasm     ED Discharge Orders         Ordered    chlorpheniramine-HYDROcodone (TUSSIONEX PENNKINETIC ER) 10-8 MG/5ML SUER  2 times daily     07/13/18 1158    benzonatate (TESSALON PERLES) 100 MG capsule  3 times daily PRN     07/13/18 1158    methylPREDNISolone (MEDROL DOSEPAK) 4 MG TBPK tablet     07/13/18 1158           Note:   This document was prepared using Dragon voice recognition software and may include unintentional dictation errors.    Sable Feil, PA-C 07/13/18 1204    Lavonia Drafts, MD 07/13/18 [id number]

## 2018-07-13 NOTE — ED Triage Notes (Signed)
Pt presents to ED via POV with c/o cough and congestion. Pt states strong productive cough with yellow sputum at this time.

## 2018-07-17 ENCOUNTER — Ambulatory Visit
Admission: RE | Admit: 2018-07-17 | Discharge: 2018-07-17 | Disposition: A | Discharge status: Home / Self Care | Payer: BLUE CROSS/BLUE SHIELD | Source: Ambulatory Visit | Attending: Family Medicine | Admitting: Family Medicine

## 2018-07-17 DIAGNOSIS — D329 Benign neoplasm of meninges, unspecified: Secondary | ICD-10-CM | POA: Insufficient documentation

## 2018-07-17 MED ORDER — GADOBUTROL 1 MMOL/ML IV SOLN
10.0000 mL | Freq: Once | INTRAVENOUS | Status: AC | PRN
  Administered 2018-07-17: 10 mL via INTRAVENOUS

## 2018-07-18 ENCOUNTER — Encounter (INDEPENDENT_AMBULATORY_CARE_PROVIDER_SITE_OTHER): Payer: Self-pay | Admitting: Nurse Practitioner

## 2018-07-18 ENCOUNTER — Ambulatory Visit (INDEPENDENT_AMBULATORY_CARE_PROVIDER_SITE_OTHER): Payer: BLUE CROSS/BLUE SHIELD

## 2018-07-18 ENCOUNTER — Ambulatory Visit (INDEPENDENT_AMBULATORY_CARE_PROVIDER_SITE_OTHER): Payer: BLUE CROSS/BLUE SHIELD | Admitting: Nurse Practitioner

## 2018-07-18 VITALS — BP 120/76 | HR 56 | Resp 18 | Ht 69.0 in | Wt 216.4 lb

## 2018-07-18 DIAGNOSIS — M79605 Pain in left leg: Secondary | ICD-10-CM

## 2018-07-18 DIAGNOSIS — M79604 Pain in right leg: Secondary | ICD-10-CM

## 2018-07-18 DIAGNOSIS — I890 Lymphedema, not elsewhere classified: Secondary | ICD-10-CM | POA: Diagnosis not present

## 2018-07-18 DIAGNOSIS — J45909 Unspecified asthma, uncomplicated: Secondary | ICD-10-CM | POA: Diagnosis not present

## 2018-07-18 DIAGNOSIS — Z87891 Personal history of nicotine dependence: Secondary | ICD-10-CM

## 2018-07-18 NOTE — Progress Notes (Signed)
Subjective:    Patient ID: Kara West, female    DOB: Nov 21, 1975, 43 y.o.   MRN: [id number] Chief Complaint  Patient presents with  . Follow-up    HPI  Kara West is a 43 y.o. female that is following up today with concerns for leg pain.  She states that she has had this pain for greater than 6 months and it is described as a constant throbbing pain which is lifestyle limiting.  She has not had any trauma to the lower extremities or episodes of cellulitis.  Given the patient's risk factors for atherosclerotic disease as well as diminished pulses, we performed an ankle-brachial index today to assess for other vascular causes of lower extremity pain.  She denies any chest pain or shortness of breath.  She denies any TIA-like symptoms or amaurosis fugax.  Patient reports that she stopped smoking several years ago.  The patient underwent bilateral ABIs which revealed an ABI of 1.17 on the right and left lower extremity.  She also had triphasic waveforms in the bilateral tibial arteries, as well as strong toe waveforms.  Past Medical History:  Diagnosis Date  . Anemia    s/p transfusion  . Bronchitis   . COPD (chronic obstructive pulmonary disease) ([place])   . GERD (gastroesophageal reflux disease)   . History of Papanicolaou smear of cervix 10/12/2011   neg    Past Surgical History:  Procedure Laterality Date  . CESAREAN SECTION     x3  . DILATION AND CURETTAGE OF UTERUS  11/11/2011   hysteroscopy  . ENDOMETRIAL ABLATION  11/11/2011  . TUBAL LIGATION  1997    Social History   Socioeconomic History  . Marital status: Legally Separated    Spouse name: Not on file  . Number of children: Not on file  . Years of education: Not on file  . Highest education level: Not on file  Occupational History  . Not on file  Social Needs  . Financial resource strain: Not on file  . Food insecurity:    Worry: Not on file    Inability: Not on file  . Transportation  needs:    Medical: Not on file    Non-medical: Not on file  Tobacco Use  . Smoking status: Former Smoker    Packs/day: 0.20    Years: 5.00    Pack years: 1.00    Types: Cigarettes    Last attempt to quit: 06/11/2014    Years since quitting: 4.1  . Smokeless tobacco: Never Used  Substance and Sexual Activity  . Alcohol use: Yes    Alcohol/week: 0.0 standard drinks    Comment: occ  . Drug use: No  . Sexual activity: Yes    Birth control/protection: Surgical    Comment: tubal  Lifestyle  . Physical activity:    Days per week: Not on file    Minutes per session: Not on file  . Stress: Not on file  Relationships  . Social connections:    Talks on phone: Not on file    Gets together: Not on file    Attends religious service: Not on file    Active member of club or organization: Not on file    Attends meetings of clubs or organizations: Not on file    Relationship status: Not on file  . Intimate partner violence:    Fear of current or ex partner: Not on file    Emotionally abused: Not on file  Physically abused: Not on file    Forced sexual activity: Not on file  Other Topics Concern  . Not on file  Social History Narrative  . Not on file    Family History  Problem Relation Age of Onset  . Hypertension Mother   . Diabetes Mother     No Known Allergies   Review of Systems   Review of Systems: Negative Unless Checked Constitutional: [] Weight loss  [] Fever  [] Chills Cardiac: [] Chest pain   []  Atrial Fibrillation  [] Palpitations   [] Shortness of breath when laying flat   [] Shortness of breath with exertion. [] Shortness of breath at rest Vascular:  [] Pain in legs with walking   [] Pain in legs with standing [x] Pain in legs when laying flat   [] Claudication    [] Pain in feet when laying flat    [] History of DVT   [] Phlebitis   [x] Swelling in legs   [] Varicose veins   [] Non-healing ulcers Pulmonary:   [] Uses home oxygen   [] Productive cough   [] Hemoptysis   [] Wheeze   [x] COPD   [] Asthma Neurologic:  [] Dizziness   [] Seizures  [] Blackouts [] History of stroke   [] History of TIA  [] Aphasia   [] Temporary Blindness   [] Weakness or numbness in arm   [] Weakness or numbness in leg Musculoskeletal:   [] Joint swelling   [] Joint pain   [] Low back pain  []  History of Knee Replacement [] Arthritis [] back Surgeries  []  Spinal Stenosis    Hematologic:  [] Easy bruising  [] Easy bleeding   [] Hypercoagulable state   [] Anemic Gastrointestinal:  [] Diarrhea   [] Vomiting  [x] Gastroesophageal reflux/heartburn   [] Difficulty swallowing. [] Abdominal pain Genitourinary:  [] Chronic kidney disease   [] Difficult urination  [] Anuric   [] Blood in urine [] Frequent urination  [] Burning with urination   [] Hematuria Skin:  [] Rashes   [] Ulcers [] Wounds Psychological:  [] History of anxiety   []  History of major depression  []  Memory Difficulties     Objective:   Physical Exam  BP 120/76 (BP Location: Right Arm, Patient Position: Sitting)   Pulse (!) 56   Resp 18   Ht 5\' 9"  (1.753 m)   Wt 216 lb 6.4 oz (98.2 kg)   LMP 07/03/2018   BMI 31.96 kg/m   Gen: WD/WN, NAD Head: [place]/AT, No temporalis wasting.  Ear/Nose/Throat: Hearing grossly intact, nares w/o erythema or drainage Eyes: PER, EOMI, sclera nonicteric.  Neck: Supple, no masses.  No JVD.  Pulmonary:  Good air movement, no use of accessory muscles.  Cardiac: RRR Vascular: 1+ soft edema  Vessel Right Left  Radial Palpable Palpable  Dorsalis Pedis Palpable Palpable  Posterior Tibial Palpable Palpable   Gastrointestinal: soft, non-distended. No guarding/no peritoneal signs.  Musculoskeletal: M/S 5/5 throughout.  No deformity or atrophy.  Neurologic: Pain and light touch intact in extremities.  Symmetrical.  Speech is fluent. Motor exam as listed above. Psychiatric: Judgment intact, Mood & affect appropriate for pt's clinical situation. Dermatologic: No Venous rashes. No Ulcers Noted.  No changes consistent with cellulitis. Lymph :  No Cervical lymphadenopathy, no lichenification or skin changes of chronic lymphedema.      Assessment & Plan:   1. Lymphedema We have applied for lymphedema pump, but patient states that some of the information was cut off in transmission.  We will resubmit this information.  In the meantime, the patient will continue with medical grade 1 compression therapy as well as elevation, exercise and use of NSAIDs for burning and stinging.  2. Pain in both lower extremities The patient  underwent bilateral ABIs which revealed an ABI of 1.17 on the right and left lower extremity.  She also had triphasic waveforms in the bilateral tibial arteries, as well as strong toe waveforms.  Recommend:  I do not find evidence of Vascular pathology that would explain the patient's symptoms  The patient has atypical pain symptoms for vascular disease  Noninvasive studies including venous ultrasound of the legs do not identify vascular problems  The patient should continue walking and begin a more formal exercise program. The patient should continue his antiplatelet therapy and aggressive treatment of the lipid abnormalities. The patient should begin wearing graduated compression socks 15-20 mmHg strength to control her mild edema.  Patient will follow-up with me on a PRN basis  Further work-up of her lower extremity pain is deferred to the primary service     3. Moderate Persistent Asthma Continue pulmonary medications and aerosols as already ordered, these medications have been reviewed and there are no changes at this time.     Current Outpatient Medications on File Prior to Visit  Medication Sig Dispense Refill  . albuterol (PROVENTIL HFA;VENTOLIN HFA) 108 (90 Base) MCG/ACT inhaler Inhale 2 puffs into the lungs every 6 (six) hours as needed for wheezing or shortness of breath. 1 Inhaler 2  . benzonatate (TESSALON PERLES) 100 MG capsule Take 2 capsules (200 mg total) by mouth 3 (three) times daily  as needed. 30 capsule 0  . chlorpheniramine-HYDROcodone (TUSSIONEX PENNKINETIC ER) 10-8 MG/5ML SUER Take 5 mLs by mouth 2 (two) times daily. 115 mL 0  . ferrous sulfate 325 (65 FE) MG tablet Take by mouth.    . gabapentin (NEURONTIN) 100 MG capsule Take 1 capsule (100 mg total) by mouth 3 (three) times daily. 90 capsule 0  . LYRICA 75 MG capsule   0  . methylPREDNISolone (MEDROL DOSEPAK) 4 MG TBPK tablet Take Tapered dose as directed 21 tablet 0  . predniSONE (DELTASONE) 50 MG tablet Take 1 tablet (50 mg total) by mouth daily with breakfast. 5 tablet 0  . Spacer/Aero-Holding Chambers (AEROCHAMBER MV) inhaler Use as instructed 1 each 0  . thiamine 100 MG tablet Take by mouth.    . traMADol (ULTRAM) 50 MG tablet Take 1 tablet (50 mg total) by mouth every 6 (six) hours as needed. 20 tablet 0   No current facility-administered medications on file prior to visit.     There are no Patient Instructions on file for this visit. Return in about 6 months (around 01/16/2019).   Kris Hartmann, NP  This note was completed with Sales executive.  Any errors are purely unintentional.

## 2018-08-09 DIAGNOSIS — I890 Lymphedema, not elsewhere classified: Secondary | ICD-10-CM | POA: Diagnosis not present

## 2018-08-14 DIAGNOSIS — G608 Other hereditary and idiopathic neuropathies: Secondary | ICD-10-CM | POA: Diagnosis not present

## 2018-08-14 DIAGNOSIS — D329 Benign neoplasm of meninges, unspecified: Secondary | ICD-10-CM | POA: Diagnosis not present

## 2018-08-14 DIAGNOSIS — G43719 Chronic migraine without aura, intractable, without status migrainosus: Secondary | ICD-10-CM | POA: Diagnosis not present

## 2018-08-14 DIAGNOSIS — R0683 Snoring: Secondary | ICD-10-CM | POA: Diagnosis not present

## 2018-08-31 ENCOUNTER — Other Ambulatory Visit: Payer: Self-pay

## 2018-08-31 ENCOUNTER — Emergency Department
Admission: EM | Admit: 2018-08-31 | Discharge: 2018-08-31 | Disposition: A | Discharge status: Home / Self Care | Payer: BLUE CROSS/BLUE SHIELD | Attending: Emergency Medicine | Admitting: Emergency Medicine

## 2018-08-31 ENCOUNTER — Emergency Department: Payer: BLUE CROSS/BLUE SHIELD

## 2018-08-31 DIAGNOSIS — Z79899 Other long term (current) drug therapy: Secondary | ICD-10-CM | POA: Diagnosis not present

## 2018-08-31 DIAGNOSIS — R0602 Shortness of breath: Secondary | ICD-10-CM | POA: Diagnosis not present

## 2018-08-31 DIAGNOSIS — Z87891 Personal history of nicotine dependence: Secondary | ICD-10-CM | POA: Diagnosis not present

## 2018-08-31 DIAGNOSIS — R0789 Other chest pain: Secondary | ICD-10-CM | POA: Diagnosis not present

## 2018-08-31 DIAGNOSIS — J441 Chronic obstructive pulmonary disease with (acute) exacerbation: Secondary | ICD-10-CM | POA: Diagnosis not present

## 2018-08-31 LAB — BASIC METABOLIC PANEL
Anion gap: 9 (ref 5–15)
BUN: 17 mg/dL (ref 6–20)
CALCIUM: 8.8 mg/dL — AB (ref 8.9–10.3)
CO2: 19 mmol/L — ABNORMAL LOW (ref 22–32)
Chloride: 109 mmol/L (ref 98–111)
Creatinine, Ser: 0.64 mg/dL (ref 0.44–1.00)
GFR calc Af Amer: >60 mL/min (ref >60)
GFR calc non Af Amer: >60 mL/min (ref >60)
Glucose, Bld: 130 mg/dL — ABNORMAL HIGH (ref 70–99)
Potassium: 3.1 mmol/L — ABNORMAL LOW (ref 3.5–5.1)
Sodium: 137 mmol/L (ref 135–145)

## 2018-08-31 LAB — CBC
HCT: 38.9 % (ref 36.0–46.0)
Hemoglobin: 12.8 g/dL (ref 12.0–15.0)
MCH: 29.4 pg (ref 26.0–34.0)
MCHC: 32.9 g/dL (ref 30.0–36.0)
MCV: 89.2 fL (ref 80.0–100.0)
Platelets: 201 10*3/uL (ref 150–400)
RBC: 4.36 MIL/uL (ref 3.87–5.11)
RDW: 13.2 % (ref 11.5–15.5)
WBC: 4.3 10*3/uL (ref 4.0–10.5)
nRBC: 0 % (ref 0.0–0.2)

## 2018-08-31 LAB — TROPONIN I: Troponin I: <0.03 ng/mL (ref <0.03)

## 2018-08-31 MED ORDER — PREDNISONE 50 MG PO TABS: 50.0000 mg | ORAL_TABLET | Freq: Every day | ORAL | 0 refills | Status: DC

## 2018-08-31 MED ORDER — IPRATROPIUM-ALBUTEROL 0.5-2.5 (3) MG/3ML IN SOLN
3.0000 mL | Freq: Once | RESPIRATORY_TRACT | Status: AC
  Administered 2018-08-31: 3 mL via RESPIRATORY_TRACT
  Filled 2018-08-31: qty 3

## 2018-08-31 MED ORDER — METHYLPREDNISOLONE SODIUM SUCC 125 MG IJ SOLR
125.0000 mg | Freq: Once | INTRAMUSCULAR | Status: AC
  Administered 2018-08-31: 125 mg via INTRAVENOUS
  Filled 2018-08-31: qty 2

## 2018-08-31 MED ORDER — DOXYCYCLINE HYCLATE 100 MG PO TABS: 100.0000 mg | ORAL_TABLET | Freq: Two times a day (BID) | ORAL | 0 refills | Status: DC

## 2018-08-31 NOTE — ED Notes (Signed)
Patient transported to X-ray 

## 2018-08-31 NOTE — ED Notes (Signed)
First Nurse Note: Patient to Rm 1, Rhae Lerner RN aware of room placement.

## 2018-08-31 NOTE — ED Triage Notes (Signed)
Pt states hx of COPD and bronchitis. States feels like she can't breath x 2 weeks. States cough. States using inhalers at home without relief. A&O, talking in complete sentences. Ambulatory. No resp distress noted at this time.

## 2018-08-31 NOTE — ED Provider Notes (Signed)
[facility] Emergency Department Provider Note   ____________________________________________    I have reviewed the triage vital signs and the nursing notes.   HISTORY  Chief Complaint Shortness of Breath     HPI Kara West is a 43 y.o. female with history of COPD presents with complaints of cough and mild shortness of breath.  Patient reports she has been coughing and "feeling tight "for 2 weeks.  She has been treating this at home with her inhalers and Robitussin but has not been improving significantly.  Over the last couple of days she has felt mildly short of breath with exertion so she became concerned and came to the emergency department.  No recent travel.  No exposure to people who have recently travel to [place] or [place].  Denies fevers.  No leg pain or swelling  Past Medical History:  Diagnosis Date  . Anemia    s/p transfusion  . Bronchitis   . COPD (chronic obstructive pulmonary disease) ([place])   . GERD (gastroesophageal reflux disease)   . History of Papanicolaou smear of cervix 10/12/2011   neg    Patient Active Problem List   Diagnosis Date Noted  . Leg pain 07/11/2018  . Lymphedema 07/11/2018  . Menorrhagia with regular cycle 03/06/2018  . Bilateral lower extremity edema 12/18/2017  . Bilateral cellulitis of lower leg 12/18/2017  . Paresthesia of both feet 02/16/2017  . Moderate Persistent Asthma 02/18/2015  . Allergic rhinitis 02/18/2015  . COPD exacerbation ([place]) 12/01/2014    Past Surgical History:  Procedure Laterality Date  . CESAREAN SECTION     x3  . DILATION AND CURETTAGE OF UTERUS  11/11/2011   hysteroscopy  . ENDOMETRIAL ABLATION  11/11/2011  . TUBAL LIGATION  1997    Prior to Admission medications   Medication Sig Start Date End Date Taking? Authorizing Provider  albuterol (PROVENTIL HFA;VENTOLIN HFA) 108 (90 Base) MCG/ACT inhaler Inhale 2 puffs into the lungs every 6 (six) hours as needed for  wheezing or shortness of breath. 06/17/16   Johnn Hai, PA-C  benzonatate (TESSALON PERLES) 100 MG capsule Take 2 capsules (200 mg total) by mouth 3 (three) times daily as needed. 07/13/18 07/13/19  Sable Feil, PA-C  chlorpheniramine-HYDROcodone (TUSSIONEX PENNKINETIC ER) 10-8 MG/5ML SUER Take 5 mLs by mouth 2 (two) times daily. 07/13/18   Sable Feil, PA-C  doxycycline (VIBRA-TABS) 100 MG tablet Take 1 tablet (100 mg total) by mouth 2 (two) times daily. 08/31/18   Lavonia Drafts, MD  ferrous sulfate 325 (65 FE) MG tablet Take by mouth.    [provider]  gabapentin (NEURONTIN) 100 MG capsule Take 1 capsule (100 mg total) by mouth 3 (three) times daily. 02/16/17   Roselee Nova, MD  LYRICA 75 MG capsule  10/03/17   [provider]  methylPREDNISolone (MEDROL DOSEPAK) 4 MG TBPK tablet Take Tapered dose as directed 07/13/18   Sable Feil, PA-C  predniSONE (DELTASONE) 50 MG tablet Take 1 tablet (50 mg total) by mouth daily with breakfast. 08/31/18   Lavonia Drafts, MD  Spacer/Aero-Holding Chambers (AEROCHAMBER MV) inhaler Use as instructed 02/18/15   Juanito Doom, MD  thiamine 100 MG tablet Take by mouth.    [provider]  traMADol (ULTRAM) 50 MG tablet Take 1 tablet (50 mg total) by mouth every 6 (six) hours as needed. 12/08/17 12/08/18  Sable Feil, PA-C     Allergies Patient has no known allergies.  Family History  Problem Relation Age of Onset  . Hypertension Mother   . Diabetes Mother     Social History Social History   Tobacco Use  . Smoking status: Former Smoker    Packs/day: 0.20    Years: 5.00    Pack years: 1.00    Types: Cigarettes    Last attempt to quit: 06/11/2014    Years since quitting: 4.2  . Smokeless tobacco: Never Used  Substance Use Topics  . Alcohol use: Yes    Alcohol/week: 0.0 standard drinks    Comment: occ  . Drug use: No    Review of Systems  Constitutional: No fevers Eyes: No visual changes.   ENT: No sore throat. Cardiovascular: Chest tightness Respiratory: As above Gastrointestinal: No abdominal pain.  No nausea, no vomiting.   Genitourinary: Negative for dysuria. Musculoskeletal: Negative for back pain. Skin: Negative for rash. Neurological: Negative for headaches   ____________________________________________   PHYSICAL EXAM:  VITAL SIGNS: ED Triage Vitals  Enc Vitals Group     BP 08/31/18 0726 119/77     Pulse Rate 08/31/18 0726 (!) 104     Resp 08/31/18 0726 20     Temp 08/31/18 0726 (!) 97.4 F (36.3 C)     Temp Source 08/31/18 0726 Oral     SpO2 08/31/18 0726 97 %     Weight 08/31/18 0727 100.7 kg (222 lb)     Height 08/31/18 0727 1.753 m (5\' 9" )     Head Circumference --      Peak Flow --      Pain Score 08/31/18 0727 0     Pain Loc --      Pain Edu? --      Excl. in [place]? --     Constitutional: Alert and oriented.  Eyes: Conjunctivae are normal.   Nose: No congestion/rhinnorhea. Mouth/Throat: Mucous membranes are moist.    Cardiovascular: Mild tachycardia, regular rhythm. Grossly normal heart sounds.  Good peripheral circulation. Respiratory: Normal respiratory effort.  No retractions.  Scattered wheezes Gastrointestinal: Soft and nontender. No distention.  No CVA tenderness.  Musculoskeletal: No lower extremity tenderness nor edema.  Warm and well perfused Neurologic:  Normal speech and language. No gross focal neurologic deficits are appreciated.  Skin:  Skin is warm, dry and intact. No rash noted. Psychiatric: Mood and affect are normal. Speech and behavior are normal.  ____________________________________________   LABS (all labs ordered are listed, but only abnormal results are displayed)  Labs Reviewed  BASIC METABOLIC PANEL - Abnormal; Notable for the following components:      Result Value   Potassium 3.1 (*)    CO2 19 (*)    Glucose, Bld 130 (*)    Calcium 8.8 (*)    All other components within normal limits  CBC  TROPONIN I   POC URINE PREG, ED   ____________________________________________  EKG  ED ECG REPORT I, Lavonia Drafts, the attending physician, personally viewed and interpreted this ECG.  Date: 08/31/2018  Rhythm: normal sinus rhythm QRS Axis: normal Intervals: normal ST/T Wave abnormalities: normal Narrative Interpretation: no evidence of acute ischemia  ____________________________________________  RADIOLOGY  Chest x-ray no pneumonia ____________________________________________   PROCEDURES  Procedure(s) performed: No  Procedures   Critical Care performed: No ____________________________________________   INITIAL IMPRESSION / ASSESSMENT AND PLAN / ED COURSE  Pertinent labs & imaging results that were available during my care of the patient were reviewed by me and considered in my medical decision making (see chart  for details).  Patient presents with wheezing on exam, occasional cough.  Suspect upper respiratory infection/bronchitis exacerbating her COPD.  Pneumonia is also on the differential.  We will treat with duo nebs, IV Solu-Medrol.  Lab work is overall reassuring.  Chest x-ray is negative for pneumonia  After treatment patient is feeling significantly better, still some very mild wheezes, will discharge with 5 days of prednisone, doxycycline, strict return precautions, patient agrees with this plan    ____________________________________________   FINAL CLINICAL IMPRESSION(S) / ED DIAGNOSES  Final diagnoses:  COPD exacerbation ([place])        Note:  This document was prepared using Dragon voice recognition software and may include unintentional dictation errors.   Lavonia Drafts, MD 08/31/18 [phone]

## 2018-11-14 DIAGNOSIS — M76821 Posterior tibial tendinitis, right leg: Secondary | ICD-10-CM | POA: Diagnosis not present

## 2018-11-14 DIAGNOSIS — M79604 Pain in right leg: Secondary | ICD-10-CM | POA: Diagnosis not present

## 2018-11-14 DIAGNOSIS — G608 Other hereditary and idiopathic neuropathies: Secondary | ICD-10-CM | POA: Diagnosis not present

## 2018-11-14 DIAGNOSIS — M25571 Pain in right ankle and joints of right foot: Secondary | ICD-10-CM | POA: Diagnosis not present

## 2018-11-14 DIAGNOSIS — M79605 Pain in left leg: Secondary | ICD-10-CM | POA: Diagnosis not present

## 2018-11-14 DIAGNOSIS — M76822 Posterior tibial tendinitis, left leg: Secondary | ICD-10-CM | POA: Diagnosis not present

## 2018-11-14 DIAGNOSIS — M25572 Pain in left ankle and joints of left foot: Secondary | ICD-10-CM | POA: Diagnosis not present

## 2018-11-20 ENCOUNTER — Ambulatory Visit: Payer: BC Managed Care – PPO | Attending: Orthopedic Surgery

## 2018-11-20 ENCOUNTER — Other Ambulatory Visit: Payer: Self-pay

## 2018-11-20 DIAGNOSIS — M25572 Pain in left ankle and joints of left foot: Secondary | ICD-10-CM

## 2018-11-20 DIAGNOSIS — M25571 Pain in right ankle and joints of right foot: Secondary | ICD-10-CM

## 2018-11-21 NOTE — Therapy (Signed)
[facility] [address], [place], [address] Phone: [phone]   Fax:  [phone]  Physical Therapy Evaluation  Patient Details  Name: Kara West MRN: [id number] Date of Birth: 17-Jan-1976 Referring Provider (PT): Teresita Madura PA   Encounter Date: 11/20/2018  PT End of Session - 11/20/18 1707    Visit Number  1    Number of Visits  13    Date for PT Re-Evaluation  01/01/19    PT Start Time  1600    PT Stop Time  1645    PT Time Calculation (min)  45 min    Activity Tolerance  Patient tolerated treatment well;Patient limited by pain    Behavior During Therapy  [facility] for tasks assessed/performed       Past Medical History:  Diagnosis Date  . Anemia    s/p transfusion  . Bronchitis   . COPD (chronic obstructive pulmonary disease) ([place])   . GERD (gastroesophageal reflux disease)   . History of Papanicolaou smear of cervix 10/12/2011   neg    Past Surgical History:  Procedure Laterality Date  . CESAREAN SECTION     x3  . DILATION AND CURETTAGE OF UTERUS  11/11/2011   hysteroscopy  . ENDOMETRIAL ABLATION  11/11/2011  . TUBAL LIGATION  1997    There were no vitals filed for this visit.   Subjective Assessment - 11/20/18 1705    Subjective  Patient states increased pain onset 2 years ago from idiopathic onset. Patient reports over the past e years she's had B ankle pain along the lateal and medial aspect of her ankles. Patient states it hurts to bear weight onto her ankles but does not demosntrates pain in non weight bearing positioning. Patient states she works on the floor and has t ostand for 12  hours per day which singificnatly increases her pain. Patient states walking, standing, carrying and lifting all aggravate her pain and her goal is to be able to walk without and move with that strong increase in pain. Patient states originally it was thought to be poor vascular return to her  LEs which caused the pain, however after different forms of treatment, it was decided that that wasn't the primary issue. Patinet reports she uses ankle braces B to amb but has had difficulty find shoes to fit.      Pertinent History  Chronic B foot/ankle pain    Limitations  Lifting;Standing;Walking    Diagnostic tests  Nerve conduction velocity test: Normal B    Patient Stated Goals  To decrease pain    Currently in Pain?  Yes    Pain Score  0-No pain   worst: 10/10   Pain Location  Foot    Pain Orientation  Right;Left    Pain Descriptors / Indicators  Aching;Discomfort;Nagging    Pain Type  Chronic pain    Pain Onset  More than a month ago    Pain Frequency  Intermittent         [facility] PT Assessment - 11/21/18 1143      Assessment   Medical Diagnosis  B foot/ankle pain    Referring Provider (PT)  Teresita Madura PA    Onset Date/Surgical Date  06/21/15    Hand Dominance  Right    Next MD Visit  unknown    Prior Therapy  no      Balance Screen   Has the patient fallen in the  past 6 months  No    Has the patient had a decrease in activity level because of a fear of falling?   Yes    Is the patient reluctant to leave their home because of a fear of falling?   No      Home Film/video editor residence    Living Arrangements  Alone      Prior Function   Level of Independence  Independent    Vocation  Full time employment    Vocation Requirements  Standing, lifting, carrying    Leisure  relaxing at home, playing with grandkids      Observation/Other Assessments   Observations  Increased weight bearing on L LE      Sensation   Light Touch  Appears Intact      Functional Tests   Functional tests  Single leg stance      Single Leg Stance   Comments  Unable to perform on R secondary to pain, able to perform on L for >10sec      Posture/Postural Control   Posture Comments  Leftward lean in standing      ROM / Strength   AROM / PROM /  Strength  AROM;Strength      AROM   Overall AROM Comments  Hip/knee: WNL    AROM Assessment Site  Ankle   pain at end range for all motions   Right/Left Ankle  Left;Right    Right Ankle Dorsiflexion  -10    Right Ankle Plantar Flexion  55    Right Ankle Inversion  35    Right Ankle Eversion  15    Left Ankle Dorsiflexion  -5    Left Ankle Plantar Flexion  55    Left Ankle Inversion  35    Left Ankle Eversion  -5      Strength   Strength Assessment Site  Hip;Knee;Ankle    Right/Left Hip  Right;Left    Right Hip Flexion  4+/5    Right Hip External Rotation   4/5    Right Hip Internal Rotation  4+/5    Right Hip ABduction  4/5    Right Hip ADduction  4+/5    Left Hip Flexion  4+/5    Left Hip External Rotation  4+/5    Left Hip Internal Rotation  4+/5    Left Hip ABduction  4+/5    Left Hip ADduction  4+/5    Right/Left Knee  Right;Left    Right Knee Flexion  5/5    Right Knee Extension  5/5    Left Knee Flexion  5/5    Left Knee Extension  5/5    Right/Left Ankle  Right;Left   Pain with all resisted motions    Right Ankle Dorsiflexion  4+/5    Right Ankle Plantar Flexion  3/5    Right Ankle Inversion  4/5    Right Ankle Eversion  4/5    Left Ankle Dorsiflexion  4/5    Left Ankle Plantar Flexion  4/5    Left Ankle Inversion  4/5    Left Ankle Eversion  4/5      Palpation   Palpation comment  TTP along the the medial and lateral ankles       Special Tests    Special Tests  Ankle/Foot Special Tests    Ankle/Foot Special Tests   Talar Tilt Test;Great Toe Extension Test      Talar Tilt Test  Findings  Postive    Comments  Postive B      Great Toe Extension Test    Comments  Negative         Objective measurements completed on examination: See above findings.    TREATMENT Therapeutic Exercise Ankle circles --x 30 Dorsiflexion/plantarflexion in long sitting -- x20 Weight shifting onto B LEs for improved weight acceptance -- x 20   Performed exercises to  decreased body pain response with weight shifting and weight bearing positions    PT Education - 11/20/18 1706    Education Details  Form/technique with exercise; HEP: ankle circles/ plantar/dorsiflexion    Person(s) Educated  Patient    Methods  Explanation;Demonstration    Comprehension  Verbalized understanding;Returned demonstration          PT Long Term Goals - 11/20/18 1714      PT LONG TERM GOAL #1   Title  Patient will be able to wash her dishes without pain >5/10 to indicate significant improvement in her pain and greater ability to perform ADLs.    Baseline  10/10 pain with washing dishes    Time  6    Period  Weeks    Status  New    Target Date  01/01/19      PT LONG TERM GOAL #2   Title  Patient will be independent with HEP to continue benefits of therapy until after discharge.    Baseline  Dependent with form and technique    Time  6    Period  Weeks    Status  New    Target Date  01/01/19      PT LONG TERM GOAL #3   Title  Patient will be able to work a full shift without pain >5/10 to indicate significant improvement with weight acceptance and improvement in function.    Baseline  10/10 pain with amb.    Time  6    Period  Weeks    Status  New    Target Date  01/01/19      PT LONG TERM GOAL #4   Title  Patient will be able to walk in the grass without LOB to better play with her grandkids.    Baseline  Unable to perform secondary to fear of falling    Time  6    Period  Weeks    Status  New    Target Date  01/01/19             Plan - 11/20/18 1708    Clinical Impression Statement  Patient is a 43 yo female presenting with increased pain and spasms along the medial and lateral aspects of her R and L ankle. Patient demonstrates increased ankle dysfunction with allodynia most notablably with weight bearing positions and inversion/eversion of the ankle. Indicated post tib involvement based on resisted MMT for inversion and TTP along the area B.  Patient will benefit from further skilled therapy focused on improving limitations of strength and ankle coordination to return to prior level of function.     Personal Factors and Comorbidities  Comorbidity 3+    Comorbidities  anxiety/depression, obesity, chronicity of pain    Examination-Activity Limitations  Squat;Stairs;Stand;Locomotion Level;Transfers    Examination-Participation Restrictions  Cleaning;Laundry;Community Activity    Stability/Clinical Decision Making  Evolving/Moderate complexity    Clinical Decision Making  Moderate    Rehab Potential  Fair    PT Frequency  2x / week    PT Duration  6 weeks    PT Treatment/Interventions  Electrical Stimulation;Iontophoresis 4mg /ml Dexamethasone;Moist Heat;Cryotherapy;Gait training;Therapeutic activities;Therapeutic exercise;Neuromuscular re-education;Patient/family education;Passive range of motion;Joint Manipulations;Spinal Manipulations    PT Next Visit Plan  progress mobility and weight acceptance exercises    PT Home Exercise Plan  ankle circles    Consulted and Agree with Plan of Care  Patient       Patient will benefit from skilled therapeutic intervention in order to improve the following deficits and impairments:  Abnormal gait, Impaired sensation, Decreased coordination, Decreased mobility, Increased muscle spasms, Decreased activity tolerance, Decreased endurance, Decreased range of motion, Decreased strength, Decreased balance, Difficulty walking  Visit Diagnosis: Pain in left ankle and joints of left foot  Pain in right ankle and joints of right foot     Problem List Patient Active Problem List   Diagnosis Date Noted  . Leg pain 07/11/2018  . Lymphedema 07/11/2018  . Menorrhagia with regular cycle 03/06/2018  . Bilateral lower extremity edema 12/18/2017  . Bilateral cellulitis of lower leg 12/18/2017  . Paresthesia of both feet 02/16/2017  . Moderate Persistent Asthma 02/18/2015  . Allergic rhinitis 02/18/2015   . COPD exacerbation ([place]) 12/01/2014    Blythe Stanford, PT DPT 11/21/2018, 11:59 AM  [facility] [address], [place], [address] Phone: [phone]   Fax:  [phone]  Name: Kara West MRN: [id number] Date of Birth: 1975-09-20

## 2018-11-27 ENCOUNTER — Other Ambulatory Visit: Payer: Self-pay

## 2018-11-27 ENCOUNTER — Ambulatory Visit: Payer: BC Managed Care – PPO

## 2018-11-27 DIAGNOSIS — M25571 Pain in right ankle and joints of right foot: Secondary | ICD-10-CM

## 2018-11-27 DIAGNOSIS — M25572 Pain in left ankle and joints of left foot: Secondary | ICD-10-CM

## 2018-11-27 NOTE — Therapy (Signed)
[facility] [address] [place], [address] Phone: [phone]   Fax:  [phone]  Physical Therapy Treatment  Patient Details  Name: Kara West MRN: [id number] Date of Birth: 08-Nov-1975 Referring Provider (PT): Teresita Madura PA   Encounter Date: 11/27/2018  PT End of Session - 11/27/18 0906    Visit Number  2    Number of Visits  13    Date for PT Re-Evaluation  01/01/19    PT Start Time  0800    PT Stop Time  0845    PT Time Calculation (min)  45 min    Activity Tolerance  Patient tolerated treatment well;Patient limited by pain    Behavior During Therapy  [facility] for tasks assessed/performed       Past Medical History:  Diagnosis Date  . Anemia    s/p transfusion  . Bronchitis   . COPD (chronic obstructive pulmonary disease) ([place])   . GERD (gastroesophageal reflux disease)   . History of Papanicolaou smear of cervix 10/12/2011   neg    Past Surgical History:  Procedure Laterality Date  . CESAREAN SECTION     x3  . DILATION AND CURETTAGE OF UTERUS  11/11/2011   hysteroscopy  . ENDOMETRIAL ABLATION  11/11/2011  . TUBAL LIGATION  1997    There were no vitals filed for this visit.  Subjective Assessment - 11/27/18 0903    Subjective  Patient states improvement with some of the ankle and foot pain compared to the previous session. Patient states she has been performing her exercises.     Pertinent History  Chronic B foot/ankle pain    Limitations  Lifting;Standing;Walking    Diagnostic tests  Nerve conduction velocity test: Normal B    Patient Stated Goals  To decrease pain    Currently in Pain?  No/denies    Pain Score  --   worst 10/10   Pain Descriptors / Indicators  Aching    Pain Type  Chronic pain    Pain Onset  More than a month ago    Pain Frequency  Intermittent       TREATMENT  Manual therapy: STM performing along the peroneals, flexor digitorum longus and  tibialis posterior, and the deep intrinsic foot musculature around the arch of the foot to decrease pain and spasms. With patient in sitting during the treatment session Therapeutic Exercise: Ankle isometrics: inversion/eversion/ plantarflexion/dorsiflexion - 5 sec holds x 10  Ankle 4 ways with RTB - x 10 with patient is sitting Arch lifts in sitting (unweighted) - x 20 B Performed exercise to improve ankle stability and strength. Patient demonstrates no increase in pain at the end of the session. Patient reports increased ankle fatigue at the end of the session.  PT Education - 11/27/18 0905    Education Details  Form/technique with exercise; HEP: 4 way ankles, ankle isometrics    Person(s) Educated  Patient    Methods  Explanation;Demonstration    Comprehension  Verbalized understanding;Returned demonstration          PT Long Term Goals - 11/20/18 1714      PT LONG TERM GOAL #1   Title  Patient will be able to wash her dishes without pain >5/10 to indicate significant improvement in her pain and greater ability to perform ADLs.    Baseline  10/10 pain with washing dishes    Time  6    Period  Weeks  Status  New    Target Date  01/01/19      PT LONG TERM GOAL #2   Title  Patient will be independent with HEP to continue benefits of therapy until after discharge.    Baseline  Dependent with form and technique    Time  6    Period  Weeks    Status  New    Target Date  01/01/19      PT LONG TERM GOAL #3   Title  Patient will be able to work a full shift without pain >5/10 to indicate significant improvement with weight acceptance and improvement in function.    Baseline  10/10 pain with amb.    Time  6    Period  Weeks    Status  New    Target Date  01/01/19      PT LONG TERM GOAL #4   Title  Patient will be able to walk in the grass without LOB to better play with her grandkids.    Baseline  Unable to perform secondary to fear of falling    Time  6    Period  Weeks     Status  New    Target Date  01/01/19            Plan - 11/27/18 0906    Clinical Impression Statement  Patient demonstrates decreased pain and spasms after performing isometrics exercises. Patient demonstrates improvement with single leg stance compared to the previous session but continues to demonstrate difficulty with ankle stabilization. Patient demonstrates poor strength in her ankle musculature B and will benefit from further skilled therapy to return to prior level of function.     Personal Factors and Comorbidities  Comorbidity 3+    Comorbidities  anxiety/depression, obesity, chronicity of pain    Examination-Activity Limitations  Squat;Stairs;Stand;Locomotion Level;Transfers    Examination-Participation Restrictions  Cleaning;Laundry;Community Activity    Stability/Clinical Decision Making  Evolving/Moderate complexity    Rehab Potential  Fair    PT Frequency  2x / week    PT Duration  6 weeks    PT Treatment/Interventions  Electrical Stimulation;Iontophoresis 4mg /ml Dexamethasone;Moist Heat;Cryotherapy;Gait training;Therapeutic activities;Therapeutic exercise;Neuromuscular re-education;Patient/family education;Passive range of motion;Joint Manipulations;Spinal Manipulations    PT Next Visit Plan  progress mobility and weight acceptance exercises    PT Home Exercise Plan  ankle circles    Consulted and Agree with Plan of Care  Patient       Patient will benefit from skilled therapeutic intervention in order to improve the following deficits and impairments:  Abnormal gait, Impaired sensation, Decreased coordination, Decreased mobility, Increased muscle spasms, Decreased activity tolerance, Decreased endurance, Decreased range of motion, Decreased strength, Decreased balance, Difficulty walking  Visit Diagnosis: Pain in left ankle and joints of left foot  Pain in right ankle and joints of right foot     Problem List Patient Active Problem List   Diagnosis Date Noted   . Leg pain 07/11/2018  . Lymphedema 07/11/2018  . Menorrhagia with regular cycle 03/06/2018  . Bilateral lower extremity edema 12/18/2017  . Bilateral cellulitis of lower leg 12/18/2017  . Paresthesia of both feet 02/16/2017  . Moderate Persistent Asthma 02/18/2015  . Allergic rhinitis 02/18/2015  . COPD exacerbation ([place]) 12/01/2014    Blythe Stanford, PT DPT 11/27/2018, 10:54 AM  [facility] [address] [place], [address] Phone: [phone]   Fax:  [phone]  Name: Kara West MRN: [id number]  Date of Birth: August 12, 1975

## 2018-11-29 ENCOUNTER — Ambulatory Visit: Payer: BC Managed Care – PPO

## 2018-12-04 ENCOUNTER — Other Ambulatory Visit: Payer: Self-pay

## 2018-12-04 ENCOUNTER — Ambulatory Visit: Payer: BC Managed Care – PPO

## 2018-12-04 DIAGNOSIS — M25571 Pain in right ankle and joints of right foot: Secondary | ICD-10-CM | POA: Diagnosis not present

## 2018-12-04 DIAGNOSIS — M25572 Pain in left ankle and joints of left foot: Secondary | ICD-10-CM | POA: Diagnosis not present

## 2018-12-04 NOTE — Therapy (Signed)
[facility] [address] [place], [address] Phone: [phone]   Fax:  [phone]  Physical Therapy Treatment  Patient Details  Name: Kara West MRN: [id number] Date of Birth: [birth year] Referring Provider (PT): Teresita Madura PA   Encounter Date: 12/04/2018  PT End of Session - 12/04/18 0847    Visit Number  3    Number of Visits  13    Date for PT Re-Evaluation  01/01/19    PT Start Time  0800    PT Stop Time  0847    PT Time Calculation (min)  47 min    Activity Tolerance  Patient tolerated treatment well;Patient limited by pain    Behavior During Therapy  [facility] for tasks assessed/performed       Past Medical History:  Diagnosis Date  . Anemia    s/p transfusion  . Bronchitis   . COPD (chronic obstructive pulmonary disease) ([place])   . GERD (gastroesophageal reflux disease)   . History of Papanicolaou smear of cervix 10/12/2011   neg    Past Surgical History:  Procedure Laterality Date  . CESAREAN SECTION     x3  . DILATION AND CURETTAGE OF UTERUS  11/11/2011   hysteroscopy  . ENDOMETRIAL ABLATION  11/11/2011  . TUBAL LIGATION  1997    There were no vitals filed for this visit.  Subjective Assessment - 12/04/18 0845    Subjective  Patient states her foot and ankles have been feeling better but she continues to have increase in pain after and during work.    Pertinent History  Chronic B foot/ankle pain    Limitations  Lifting;Standing;Walking    Diagnostic tests  Nerve conduction velocity test: Normal B    Patient Stated Goals  To decrease pain    Currently in Pain?  No/denies    Pain Onset  More than a month ago       TREATMENT  Manual therapy: STM performing along the peroneals, flexor digitorum longus and tibialis posterior, and the deep intrinsic foot musculature around the arch of the foot to decrease pain and spasms. With patient in sitting during the treatment  session  Mobs in sitting PA pressure grade III to improve ankle mobility and decrease pain with dorisflexion -- 5 x 30sec  B  Therapeutic Exercise: Ankle isometrics: plantarflexion - 5 sec holds x 10 AROM Ankle mobility with therapist OP to achieve full AROM -- x 10 Arch lifts in sitting (unweighted) - x 10 B BAPS board laterally/forward/backward in sitting level 2 -- x 20 each direction B LEs Heel raises in standing with UE support -- x 12 Heel raises with unilateral eccentric lowering -- x 12  Performed exercise to improve ankle stability and strength. Patient demonstrates no increase in pain at the end of the session. Patient reports increased ankle fatigue at the end of the session.     PT Education - 12/04/18 0847    Education Details  HEP: heel raises in standing; form/technique with exercise    Person(s) Educated  Patient    Methods  Explanation;Demonstration    Comprehension  Verbalized understanding;Returned demonstration          PT Long Term Goals - 11/20/18 1714      PT LONG TERM GOAL #1   Title  Patient will be able to wash her dishes without pain >5/10 to indicate significant improvement in her pain and greater ability to perform ADLs.  Baseline  10/10 pain with washing dishes    Time  6    Period  Weeks    Status  New    Target Date  01/01/19      PT LONG TERM GOAL #2   Title  Patient will be independent with HEP to continue benefits of therapy until after discharge.    Baseline  Dependent with form and technique    Time  6    Period  Weeks    Status  New    Target Date  01/01/19      PT LONG TERM GOAL #3   Title  Patient will be able to work a full shift without pain >5/10 to indicate significant improvement with weight acceptance and improvement in function.    Baseline  10/10 pain with amb.    Time  6    Period  Weeks    Status  New    Target Date  01/01/19      PT LONG TERM GOAL #4   Title  Patient will be able to walk in the grass without  LOB to better play with her grandkids.    Baseline  Unable to perform secondary to fear of falling    Time  6    Period  Weeks    Status  New    Target Date  01/01/19            Plan - 12/04/18 0855    Clinical Impression Statement  Patient demonstrates decreased pain compared to the beginning of the session most notably after performing mobility based exercises and manual therapy. Focused on progressing ankle strengthening through comfortable AROMs and improve motor control. Patient demosntrates increased pain with inversion on the R foot/ankle however demonstrates less pain at the end of the session. Patient wil benefit from further skilled therapy focused on improving ankle strength to create more comfortable walking and standing.    Personal Factors and Comorbidities  Comorbidity 3+    Comorbidities  anxiety/depression, obesity, chronicity of pain    Examination-Activity Limitations  Squat;Stairs;Stand;Locomotion Level;Transfers    Examination-Participation Restrictions  Cleaning;Laundry;Community Activity    Stability/Clinical Decision Making  Evolving/Moderate complexity    Rehab Potential  Fair    PT Frequency  2x / week    PT Duration  6 weeks    PT Treatment/Interventions  Electrical Stimulation;Iontophoresis 4mg /ml Dexamethasone;Moist Heat;Cryotherapy;Gait training;Therapeutic activities;Therapeutic exercise;Neuromuscular re-education;Patient/family education;Passive range of motion;Joint Manipulations;Spinal Manipulations    PT Next Visit Plan  progress mobility and weight acceptance exercises    PT Home Exercise Plan  ankle circles    Consulted and Agree with Plan of Care  Patient       Patient will benefit from skilled therapeutic intervention in order to improve the following deficits and impairments:  Abnormal gait, Impaired sensation, Decreased coordination, Decreased mobility, Increased muscle spasms, Decreased activity tolerance, Decreased endurance, Decreased range  of motion, Decreased strength, Decreased balance, Difficulty walking  Visit Diagnosis: 1. Pain in left ankle and joints of left foot   2. Pain in right ankle and joints of right foot        Problem List Patient Active Problem List   Diagnosis Date Noted  . Leg pain 07/11/2018  . Lymphedema 07/11/2018  . Menorrhagia with regular cycle 03/06/2018  . Bilateral lower extremity edema 12/18/2017  . Bilateral cellulitis of lower leg 12/18/2017  . Paresthesia of both feet 02/16/2017  . Moderate Persistent Asthma 02/18/2015  . Allergic rhinitis 02/18/2015  .  COPD exacerbation ([place]) 12/01/2014    Blythe Stanford, PT DPT 12/04/2018, 8:59 AM  [facility] [address], [place], [address] Phone: [phone]   Fax:  [phone]  Name: Nacole Fluhr MRN: [id number] Date of Birth: 03/22/1976

## 2018-12-05 ENCOUNTER — Ambulatory Visit: Payer: BC Managed Care – PPO

## 2018-12-05 ENCOUNTER — Other Ambulatory Visit: Payer: Self-pay

## 2018-12-05 DIAGNOSIS — M25571 Pain in right ankle and joints of right foot: Secondary | ICD-10-CM | POA: Diagnosis not present

## 2018-12-05 DIAGNOSIS — M25572 Pain in left ankle and joints of left foot: Secondary | ICD-10-CM | POA: Diagnosis not present

## 2018-12-05 NOTE — Therapy (Signed)
[facility] [address] [place], [address] Phone: [phone]   Fax:  [phone]  Physical Therapy Treatment  Patient Details  Name: Kara West MRN: [id number] Date of Birth: 09-30-1975 Referring Provider (PT): Teresita Madura PA   Encounter Date: 12/05/2018  PT End of Session - 12/05/18 0852    Visit Number  4    Number of Visits  13    Date for PT Re-Evaluation  01/01/19    PT Start Time  0800    PT Stop Time  0845    PT Time Calculation (min)  45 min    Activity Tolerance  Patient tolerated treatment well;Patient limited by pain    Behavior During Therapy  [facility] for tasks assessed/performed       Past Medical History:  Diagnosis Date  . Anemia    s/p transfusion  . Bronchitis   . COPD (chronic obstructive pulmonary disease) ([place])   . GERD (gastroesophageal reflux disease)   . History of Papanicolaou smear of cervix 10/12/2011   neg    Past Surgical History:  Procedure Laterality Date  . CESAREAN SECTION     x3  . DILATION AND CURETTAGE OF UTERUS  11/11/2011   hysteroscopy  . ENDOMETRIAL ABLATION  11/11/2011  . TUBAL LIGATION  1997    There were no vitals filed for this visit.  Subjective Assessment - 12/05/18 0851    Subjective  Patient reports her ankle felt good after the last sesion but is hurting her today from doing more chores around the house.    Pertinent History  Chronic B foot/ankle pain    Limitations  Lifting;Standing;Walking    Diagnostic tests  Nerve conduction velocity test: Normal B    Patient Stated Goals  To decrease pain    Currently in Pain?  Yes    Pain Score  7     Pain Location  Ankle    Pain Orientation  Right;Left    Pain Descriptors / Indicators  Aching    Pain Type  Chronic pain    Pain Onset  More than a month ago          TREATMENT  Manual therapy: STM performing along the peroneals, flexor digitorum longus and tibialis posterior,  and the deep intrinsic foot musculature around the arch of the foot to decrease pain and spasms. With patient in sitting during the treatment session   Mobs in sitting PA pressure grade III to improve ankle mobility and decrease pain with dorisflexion -- 3 x 30sec  B   Therapeutic Exercise: Ankle isometrics: plantarflexion - 5 sec holds x 10 AROM with towel Ankle mobility with therapist OP to achieve full AROM -- x 10 inversion/eversion/dorsiflexion/plantarflexion Gastroc stretching off of 2 x 4 - x 20 with knee straight Soleus stretching off of 2x4 - x 20 with knee bent  Performed exercise to improve ankle stability and strength. Patient demonstrates no increase in pain at the end of the session. Patient reports increased ankle fatigue at the end of the session.     PT Education - 12/05/18 0852    Education Details  HEP: gastroc stretch in standing    Person(s) Educated  Patient    Methods  Explanation;Demonstration    Comprehension  Verbalized understanding;Returned demonstration          PT Long Term Goals - 11/20/18 1714      PT LONG TERM GOAL #1   Title  Patient will be able to wash her dishes without pain >5/10 to indicate significant improvement in her pain and greater ability to perform ADLs.    Baseline  10/10 pain with washing dishes    Time  6    Period  Weeks    Status  New    Target Date  01/01/19      PT LONG TERM GOAL #2   Title  Patient will be independent with HEP to continue benefits of therapy until after discharge.    Baseline  Dependent with form and technique    Time  6    Period  Weeks    Status  New    Target Date  01/01/19      PT LONG TERM GOAL #3   Title  Patient will be able to work a full shift without pain >5/10 to indicate significant improvement with weight acceptance and improvement in function.    Baseline  10/10 pain with amb.    Time  6    Period  Weeks    Status  New    Target Date  01/01/19      PT LONG TERM GOAL #4   Title   Patient will be able to walk in the grass without LOB to better play with her grandkids.    Baseline  Unable to perform secondary to fear of falling    Time  6    Period  Weeks    Status  New    Target Date  01/01/19            Plan - 12/05/18 0853    Clinical Impression Statement  Focused on performing less intense interventions today as patient demonstrates increased pain at baseline to todays session. Patient demosntrates decreased pain after performing manual therapy and mobility exercises indicating decreased muscular guarding. Followed up with gastroc/soleus stretch and isometric holds to improve strength and maintain dorsiflexion. Patient will benefit from further skilled therapy to return to prior level of function.    Personal Factors and Comorbidities  Comorbidity 3+    Comorbidities  anxiety/depression, obesity, chronicity of pain    Examination-Activity Limitations  Squat;Stairs;Stand;Locomotion Level;Transfers    Examination-Participation Restrictions  Cleaning;Laundry;Community Activity    Stability/Clinical Decision Making  Evolving/Moderate complexity    Rehab Potential  Fair    PT Frequency  2x / week    PT Duration  6 weeks    PT Treatment/Interventions  Electrical Stimulation;Iontophoresis 4mg /ml Dexamethasone;Moist Heat;Cryotherapy;Gait training;Therapeutic activities;Therapeutic exercise;Neuromuscular re-education;Patient/family education;Passive range of motion;Joint Manipulations;Spinal Manipulations    PT Next Visit Plan  progress mobility and weight acceptance exercises    PT Home Exercise Plan  ankle circles    Consulted and Agree with Plan of Care  Patient       Patient will benefit from skilled therapeutic intervention in order to improve the following deficits and impairments:  Abnormal gait, Impaired sensation, Decreased coordination, Decreased mobility, Increased muscle spasms, Decreased activity tolerance, Decreased endurance, Decreased range of motion,  Decreased strength, Decreased balance, Difficulty walking  Visit Diagnosis: 1. Pain in left ankle and joints of left foot   2. Pain in right ankle and joints of right foot        Problem List Patient Active Problem List   Diagnosis Date Noted  . Leg pain 07/11/2018  . Lymphedema 07/11/2018  . Menorrhagia with regular cycle 03/06/2018  . Bilateral lower extremity edema 12/18/2017  . Bilateral cellulitis of lower leg 12/18/2017  . Paresthesia of both  feet 02/16/2017  . Moderate Persistent Asthma 02/18/2015  . Allergic rhinitis 02/18/2015  . COPD exacerbation ([place]) 12/01/2014    Blythe Stanford, PT DPT 12/05/2018, 8:56 AM  [facility] [address], [place], [address] Phone: [phone]   Fax:  [phone]  Name: Kara West MRN: [id number] Date of Birth: January 28, 1976

## 2018-12-07 DIAGNOSIS — M79604 Pain in right leg: Secondary | ICD-10-CM | POA: Diagnosis not present

## 2018-12-07 DIAGNOSIS — M5416 Radiculopathy, lumbar region: Secondary | ICD-10-CM | POA: Diagnosis not present

## 2018-12-07 DIAGNOSIS — G608 Other hereditary and idiopathic neuropathies: Secondary | ICD-10-CM | POA: Diagnosis not present

## 2018-12-07 DIAGNOSIS — G43719 Chronic migraine without aura, intractable, without status migrainosus: Secondary | ICD-10-CM | POA: Diagnosis not present

## 2018-12-11 ENCOUNTER — Other Ambulatory Visit: Payer: Self-pay | Admitting: Neurology

## 2018-12-11 ENCOUNTER — Ambulatory Visit: Payer: BC Managed Care – PPO

## 2018-12-11 DIAGNOSIS — M5416 Radiculopathy, lumbar region: Secondary | ICD-10-CM

## 2018-12-18 ENCOUNTER — Ambulatory Visit: Payer: BC Managed Care – PPO

## 2018-12-18 ENCOUNTER — Other Ambulatory Visit: Payer: Self-pay

## 2018-12-18 DIAGNOSIS — M25571 Pain in right ankle and joints of right foot: Secondary | ICD-10-CM

## 2018-12-18 DIAGNOSIS — M25572 Pain in left ankle and joints of left foot: Secondary | ICD-10-CM

## 2018-12-18 NOTE — Therapy (Signed)
[facility] [address] [place], [address] Phone: [phone]   Fax:  [phone]  Physical Therapy Treatment  Patient Details  Name: Kara West MRN: [id number] Date of Birth: February 28, 1976 Referring Provider (PT): Teresita Madura PA   Encounter Date: 12/18/2018  PT End of Session - 12/18/18 1552    Visit Number  5    Number of Visits  13    Date for PT Re-Evaluation  01/01/19    PT Start Time  1500    PT Stop Time  1545    PT Time Calculation (min)  45 min    Activity Tolerance  Patient tolerated treatment well;Patient limited by pain    Behavior During Therapy  [facility] for tasks assessed/performed       Past Medical History:  Diagnosis Date  . Anemia    s/p transfusion  . Bronchitis   . COPD (chronic obstructive pulmonary disease) ([place])   . GERD (gastroesophageal reflux disease)   . History of Papanicolaou smear of cervix 10/12/2011   neg    Past Surgical History:  Procedure Laterality Date  . CESAREAN SECTION     x3  . DILATION AND CURETTAGE OF UTERUS  11/11/2011   hysteroscopy  . ENDOMETRIAL ABLATION  11/11/2011  . TUBAL LIGATION  1997    There were no vitals filed for this visit.  Subjective Assessment - 12/18/18 1524    Subjective  Patient reports both her ankle and feet have been hurting her significantly. Patient states she is having an MRI by her doctor of her back to analyze her symptoms.    Pertinent History  Chronic B foot/ankle pain    Limitations  Lifting;Standing;Walking    Diagnostic tests  Nerve conduction velocity test: Normal B    Patient Stated Goals  To decrease pain    Currently in Pain?  Yes    Pain Score  10-Worst pain ever    Pain Location  Ankle    Pain Orientation  Right;Left    Pain Descriptors / Indicators  Aching    Pain Type  Chronic pain    Pain Onset  More than a month ago    Pain Frequency  Intermittent       TREATMENT  PA mobs centrally  L4-S1 5 x 30sec - grade III to decrease pain and improve mobility along the facet joints of the lumbar spine  Mobs in sitting PA pressure grade III to improve ankle mobility and decrease pain with dorisflexion -- 3 x 30sec B  Therapeutic Exercise:  Ankle mobility with therapist OP to achieve full AROM -- x 10 dorsiflexion/plantarflexion Prone press ups -- 3 x 10 Standing lumbar extension with CKC -- 2 x 10   SLUMP test (+) on the R side   Performed exercise to improve lumbar mobility.  Patient demonstrates no increase in pain at the end of the session. Patient reports increased ankle fatigue at the end of the session.      PT Education - 12/18/18 1552    Education Details  HEP: lumbar extension, prone press ups    Person(s) Educated  Patient    Methods  Explanation;Demonstration    Comprehension  Verbalized understanding;Returned demonstration          PT Long Term Goals - 11/20/18 1714      PT LONG TERM GOAL #1   Title  Patient will be able to wash her dishes without pain >5/10 to  indicate significant improvement in her pain and greater ability to perform ADLs.    Baseline  10/10 pain with washing dishes    Time  6    Period  Weeks    Status  New    Target Date  01/01/19      PT LONG TERM GOAL #2   Title  Patient will be independent with HEP to continue benefits of therapy until after discharge.    Baseline  Dependent with form and technique    Time  6    Period  Weeks    Status  New    Target Date  01/01/19      PT LONG TERM GOAL #3   Title  Patient will be able to work a full shift without pain >5/10 to indicate significant improvement with weight acceptance and improvement in function.    Baseline  10/10 pain with amb.    Time  6    Period  Weeks    Status  New    Target Date  01/01/19      PT LONG TERM GOAL #4   Title  Patient will be able to walk in the grass without LOB to better play with her grandkids.    Baseline  Unable to perform secondary  to fear of falling    Time  6    Period  Weeks    Status  New    Target Date  01/01/19            Plan - 12/18/18 1553    Clinical Impression Statement  Focused on improving hip/lumbar extension with exercise performance as patient reports increased radiating symptoms radiaiting down the LEs B. Patient demonstrates improvement in symptoms after performing extension based exercise indicating a lumbar extension directional perference. Patient continues to demonstrate some symptoms at the end of the session, but it is overall decresed. Patient will benefit from further skileld therapy to return to prior level of function.    Personal Factors and Comorbidities  Comorbidity 3+    Comorbidities  anxiety/depression, obesity, chronicity of pain    Examination-Activity Limitations  Squat;Stairs;Stand;Locomotion Level;Transfers    Examination-Participation Restrictions  Cleaning;Laundry;Community Activity    Stability/Clinical Decision Making  Evolving/Moderate complexity    Rehab Potential  Fair    PT Frequency  2x / week    PT Duration  6 weeks    PT Treatment/Interventions  Electrical Stimulation;Iontophoresis 4mg /ml Dexamethasone;Moist Heat;Cryotherapy;Gait training;Therapeutic activities;Therapeutic exercise;Neuromuscular re-education;Patient/family education;Passive range of motion;Joint Manipulations;Spinal Manipulations    PT Next Visit Plan  progress mobility and weight acceptance exercises    PT Home Exercise Plan  ankle circles    Consulted and Agree with Plan of Care  Patient       Patient will benefit from skilled therapeutic intervention in order to improve the following deficits and impairments:  Abnormal gait, Impaired sensation, Decreased coordination, Decreased mobility, Increased muscle spasms, Decreased activity tolerance, Decreased endurance, Decreased range of motion, Decreased strength, Decreased balance, Difficulty walking  Visit Diagnosis: 1. Pain in left ankle and  joints of left foot   2. Pain in right ankle and joints of right foot        Problem List Patient Active Problem List   Diagnosis Date Noted  . Leg pain 07/11/2018  . Lymphedema 07/11/2018  . Menorrhagia with regular cycle 03/06/2018  . Bilateral lower extremity edema 12/18/2017  . Bilateral cellulitis of lower leg 12/18/2017  . Paresthesia of both feet 02/16/2017  . Moderate Persistent  Asthma 02/18/2015  . Allergic rhinitis 02/18/2015  . COPD exacerbation ([place]) 12/01/2014    Blythe Stanford, PT DPT 12/18/2018, 4:00 PM  [facility] [address] [place], [address] Phone: [phone]   Fax:  [phone]  Name: Leighanne Adolph MRN: [id number] Date of Birth: 10-Jul-1975

## 2018-12-20 ENCOUNTER — Ambulatory Visit: Payer: BC Managed Care – PPO | Attending: Orthopedic Surgery

## 2018-12-20 ENCOUNTER — Other Ambulatory Visit: Payer: Self-pay

## 2018-12-20 DIAGNOSIS — M25572 Pain in left ankle and joints of left foot: Secondary | ICD-10-CM | POA: Diagnosis not present

## 2018-12-20 DIAGNOSIS — M25571 Pain in right ankle and joints of right foot: Secondary | ICD-10-CM

## 2018-12-20 NOTE — Therapy (Signed)
[facility] [address] [place], [address] Phone: [phone]   Fax:  [phone]  Physical Therapy Treatment  Patient Details  Name: Kara West MRN: [id number] Date of Birth: 10/14/1975 Referring Provider (PT): Teresita Madura PA   Encounter Date: 12/20/2018  PT End of Session - 12/20/18 1539    Visit Number  6    Number of Visits  13    Date for PT Re-Evaluation  01/01/19    PT Start Time  1500    PT Stop Time  1545    PT Time Calculation (min)  45 min    Activity Tolerance  Patient tolerated treatment well;Patient limited by pain    Behavior During Therapy  [facility] for tasks assessed/performed       Past Medical History:  Diagnosis Date  . Anemia    s/p transfusion  . Bronchitis   . COPD (chronic obstructive pulmonary disease) ([place])   . GERD (gastroesophageal reflux disease)   . History of Papanicolaou smear of cervix 10/12/2011   neg    Past Surgical History:  Procedure Laterality Date  . CESAREAN SECTION     x3  . DILATION AND CURETTAGE OF UTERUS  11/11/2011   hysteroscopy  . ENDOMETRIAL ABLATION  11/11/2011  . TUBAL LIGATION  1997    There were no vitals filed for this visit.  Subjective Assessment - 12/20/18 1504    Subjective  Patient reports she is having an MRI on 12/26/2018. Patient states her pain is elevated today but not as much as it was the previous session.    Pertinent History  Chronic B foot/ankle pain    Limitations  Lifting;Standing;Walking    Diagnostic tests  Nerve conduction velocity test: Normal B    Patient Stated Goals  To decrease pain    Currently in Pain?  Yes    Pain Score  8     Pain Location  Ankle    Pain Orientation  Right;Left    Pain Descriptors / Indicators  Aching    Pain Type  Chronic pain    Pain Onset  More than a month ago    Pain Frequency  Intermittent         TREATMENT  PA mobs centrally L3-S1 6 x 30sec - grade III/IV to  decrease pain and improve mobility along the facet joints of the lumbar spine  Mobs in sitting PA pressure grade III to improve ankle mobility and decrease pain with dorisflexion -- 3 x 30sec B  Therapeutic Exercise:  Ankle mobility with therapist OP to achieve full AROM -- x 20 dorsiflexion/plantarflexion in sitting AROM -- x 30 Prone press ups -- 2 x 15 Standing lumbar extension with CKC -- 2 x 10 Lumbar extension in standing with towel -- x 20 SLUMP glides on the R side -- x 20 with OP from therapist   Performed exercise to improve lumbar mobility.  Patient demonstrates no increase in pain at the end of the session. Patient reports increased ankle fatigue at the end of the session.     PT Education - 12/20/18 1529    Education Details  form/technique with exercise; continue HEP; slump test added    Person(s) Educated  Patient    Methods  Explanation;Demonstration    Comprehension  Verbalized understanding;Returned demonstration          PT Long Term Goals - 11/20/18 1714      PT LONG TERM  GOAL #1   Title  Patient will be able to wash her dishes without pain >5/10 to indicate significant improvement in her pain and greater ability to perform ADLs.    Baseline  10/10 pain with washing dishes    Time  6    Period  Weeks    Status  New    Target Date  01/01/19      PT LONG TERM GOAL #2   Title  Patient will be independent with HEP to continue benefits of therapy until after discharge.    Baseline  Dependent with form and technique    Time  6    Period  Weeks    Status  New    Target Date  01/01/19      PT LONG TERM GOAL #3   Title  Patient will be able to work a full shift without pain >5/10 to indicate significant improvement with weight acceptance and improvement in function.    Baseline  10/10 pain with amb.    Time  6    Period  Weeks    Status  New    Target Date  01/01/19      PT LONG TERM GOAL #4   Title  Patient will be able to walk in the grass  without LOB to better play with her grandkids.    Baseline  Unable to perform secondary to fear of falling    Time  6    Period  Weeks    Status  New    Target Date  01/01/19            Plan - 12/20/18 1539    Clinical Impression Statement  Continued to focus on improving lumbar mobility and performing greater extension based movements as patient continues to have dificulties with radiaiting pain down the leg. Patient continues to demonstrates less radiaiting pain after performing extension. Patient will benefit from further skilled therapy to return to prior level of function.    Personal Factors and Comorbidities  Comorbidity 3+    Comorbidities  anxiety/depression, obesity, chronicity of pain    Examination-Activity Limitations  Squat;Stairs;Stand;Locomotion Level;Transfers    Examination-Participation Restrictions  Cleaning;Laundry;Community Activity    Stability/Clinical Decision Making  Evolving/Moderate complexity    Rehab Potential  Fair    PT Frequency  2x / week    PT Duration  6 weeks    PT Treatment/Interventions  Electrical Stimulation;Iontophoresis 4mg /ml Dexamethasone;Moist Heat;Cryotherapy;Gait training;Therapeutic activities;Therapeutic exercise;Neuromuscular re-education;Patient/family education;Passive range of motion;Joint Manipulations;Spinal Manipulations    PT Next Visit Plan  progress mobility and weight acceptance exercises    PT Home Exercise Plan  ankle circles    Consulted and Agree with Plan of Care  Patient       Patient will benefit from skilled therapeutic intervention in order to improve the following deficits and impairments:  Abnormal gait, Impaired sensation, Decreased coordination, Decreased mobility, Increased muscle spasms, Decreased activity tolerance, Decreased endurance, Decreased range of motion, Decreased strength, Decreased balance, Difficulty walking  Visit Diagnosis: 1. Pain in left ankle and joints of left foot   2. Pain in right  ankle and joints of right foot        Problem List Patient Active Problem List   Diagnosis Date Noted  . Leg pain 07/11/2018  . Lymphedema 07/11/2018  . Menorrhagia with regular cycle 03/06/2018  . Bilateral lower extremity edema 12/18/2017  . Bilateral cellulitis of lower leg 12/18/2017  . Paresthesia of both feet 02/16/2017  . Moderate  Persistent Asthma 02/18/2015  . Allergic rhinitis 02/18/2015  . COPD exacerbation ([place]) 12/01/2014    Blythe Stanford, PT DPT 12/20/2018, 3:51 PM  [facility] [address] [place], [address] Phone: [phone]   Fax:  [phone]  Name: Kara West MRN: [id number] Date of Birth: [birth year]

## 2018-12-25 ENCOUNTER — Other Ambulatory Visit: Payer: Self-pay

## 2018-12-25 ENCOUNTER — Ambulatory Visit: Payer: BC Managed Care – PPO

## 2018-12-25 DIAGNOSIS — M25572 Pain in left ankle and joints of left foot: Secondary | ICD-10-CM

## 2018-12-25 DIAGNOSIS — M25571 Pain in right ankle and joints of right foot: Secondary | ICD-10-CM | POA: Diagnosis not present

## 2018-12-25 NOTE — Therapy (Signed)
[facility] [address] [place], [address] Phone: [phone]   Fax:  [phone]  Physical Therapy Treatment  Patient Details  Name: Kara West MRN: [id number] Date of Birth: 07/19/1975 Referring Provider (PT): Teresita Madura PA   Encounter Date: 12/25/2018  PT End of Session - 12/25/18 1712    Visit Number  7    Number of Visits  13    Date for PT Re-Evaluation  01/01/19    PT Start Time  3664    PT Stop Time  1600    PT Time Calculation (min)  45 min    Activity Tolerance  Patient tolerated treatment well;Patient limited by pain    Behavior During Therapy  [facility] for tasks assessed/performed       Past Medical History:  Diagnosis Date  . Anemia    s/p transfusion  . Bronchitis   . COPD (chronic obstructive pulmonary disease) ([place])   . GERD (gastroesophageal reflux disease)   . History of Papanicolaou smear of cervix 10/12/2011   neg    Past Surgical History:  Procedure Laterality Date  . CESAREAN SECTION     x3  . DILATION AND CURETTAGE OF UTERUS  11/11/2011   hysteroscopy  . ENDOMETRIAL ABLATION  11/11/2011  . TUBAL LIGATION  1997    There were no vitals filed for this visit.  Subjective Assessment - 12/25/18 1710    Subjective  Patient states no major changes since her previous session, reports she has been performing her HEP but continues to have increase in pain.    Pertinent History  Chronic B foot/ankle pain    Limitations  Lifting;Standing;Walking    Diagnostic tests  Nerve conduction velocity test: Normal B    Patient Stated Goals  To decrease pain    Currently in Pain?  Yes    Pain Score  6     Pain Location  Ankle    Pain Orientation  Right;Left    Pain Descriptors / Indicators  Aching    Pain Type  Chronic pain    Pain Onset  More than a month ago    Pain Frequency  Intermittent           TREATMENT  PA mobs centrally L3-S1 10 x1 min; 12 x 1 min  unilaterally L3-S1- grade /IV to decrease pain and improve mobility along the facet joints of the lumbar spine   Mobs in sitting PA pressure grade III to improve ankle mobility and decrease pain with dorisflexion -- 3 x 30sec  B   Therapeutic Exercise:   Prone press ups - 2 x 10   Performed exercise to improve lumbar mobility.  Patient demonstrates no increase in pain at the end of the session. Patient reports increased ankle fatigue at the end of the session.   PT Education - 12/25/18 1712    Education Details  form/technique with exercise; cntinue lumbar extension    Person(s) Educated  Patient    Methods  Explanation;Demonstration    Comprehension  Verbalized understanding;Returned demonstration          PT Long Term Goals - 11/20/18 1714      PT LONG TERM GOAL #1   Title  Patient will be able to wash her dishes without pain >5/10 to indicate significant improvement in her pain and greater ability to perform ADLs.    Baseline  10/10 pain with washing dishes    Time  6  Period  Weeks    Status  New    Target Date  01/01/19      PT LONG TERM GOAL #2   Title  Patient will be independent with HEP to continue benefits of therapy until after discharge.    Baseline  Dependent with form and technique    Time  6    Period  Weeks    Status  New    Target Date  01/01/19      PT LONG TERM GOAL #3   Title  Patient will be able to work a full shift without pain >5/10 to indicate significant improvement with weight acceptance and improvement in function.    Baseline  10/10 pain with amb.    Time  6    Period  Weeks    Status  New    Target Date  01/01/19      PT LONG TERM GOAL #4   Title  Patient will be able to walk in the grass without LOB to better play with her grandkids.    Baseline  Unable to perform secondary to fear of falling    Time  6    Period  Weeks    Status  New    Target Date  01/01/19            Plan - 12/25/18 1713    Clinical Impression  Statement  Patient demonstrates improvement in pain at the end of the session with most notably after performing manual therapy to the lumbar spine. Patient demonstrates symptoms that most closely align with directional perference based lumbar deficits. Patient demosntrates decreased hip strength but pain is limiting ability to address this limitation. Patient will benefit from further skilled therapy to return to prior level of function.    Personal Factors and Comorbidities  Comorbidity 3+    Comorbidities  anxiety/depression, obesity, chronicity of pain    Examination-Activity Limitations  Squat;Stairs;Stand;Locomotion Level;Transfers    Examination-Participation Restrictions  Cleaning;Laundry;Community Activity    Stability/Clinical Decision Making  Evolving/Moderate complexity    Rehab Potential  Fair    PT Frequency  2x / week    PT Duration  6 weeks    PT Treatment/Interventions  Electrical Stimulation;Iontophoresis 4mg /ml Dexamethasone;Moist Heat;Cryotherapy;Gait training;Therapeutic activities;Therapeutic exercise;Neuromuscular re-education;Patient/family education;Passive range of motion;Joint Manipulations;Spinal Manipulations    PT Next Visit Plan  progress mobility and weight acceptance exercises    PT Home Exercise Plan  ankle circles    Consulted and Agree with Plan of Care  Patient       Patient will benefit from skilled therapeutic intervention in order to improve the following deficits and impairments:  Abnormal gait, Impaired sensation, Decreased coordination, Decreased mobility, Increased muscle spasms, Decreased activity tolerance, Decreased endurance, Decreased range of motion, Decreased strength, Decreased balance, Difficulty walking  Visit Diagnosis: 1. Pain in right ankle and joints of right foot   2. Pain in left ankle and joints of left foot        Problem List Patient Active Problem List   Diagnosis Date Noted  . Leg pain 07/11/2018  . Lymphedema 07/11/2018   . Menorrhagia with regular cycle 03/06/2018  . Bilateral lower extremity edema 12/18/2017  . Bilateral cellulitis of lower leg 12/18/2017  . Paresthesia of both feet 02/16/2017  . Moderate Persistent Asthma 02/18/2015  . Allergic rhinitis 02/18/2015  . COPD exacerbation ([place]) 12/01/2014    Blythe Stanford, PT DPT 12/25/2018, 5:25 PM  [facility]  [address], [place], [address] Phone: [phone]   Fax:  [phone]  Name: Kara West MRN: [id number] Date of Birth: 1976/03/22

## 2018-12-26 ENCOUNTER — Ambulatory Visit
Admission: RE | Admit: 2018-12-26 | Discharge: 2018-12-26 | Disposition: A | Discharge status: Home / Self Care | Payer: BC Managed Care – PPO | Source: Ambulatory Visit | Attending: Neurology | Admitting: Neurology

## 2018-12-26 DIAGNOSIS — M5416 Radiculopathy, lumbar region: Secondary | ICD-10-CM | POA: Diagnosis not present

## 2018-12-26 DIAGNOSIS — M5126 Other intervertebral disc displacement, lumbar region: Secondary | ICD-10-CM | POA: Diagnosis not present

## 2018-12-27 ENCOUNTER — Ambulatory Visit: Payer: BC Managed Care – PPO

## 2019-01-01 ENCOUNTER — Ambulatory Visit: Payer: BC Managed Care – PPO

## 2019-01-01 ENCOUNTER — Other Ambulatory Visit: Payer: Self-pay

## 2019-01-01 DIAGNOSIS — M25571 Pain in right ankle and joints of right foot: Secondary | ICD-10-CM | POA: Diagnosis not present

## 2019-01-01 DIAGNOSIS — M25572 Pain in left ankle and joints of left foot: Secondary | ICD-10-CM | POA: Diagnosis not present

## 2019-01-01 NOTE — Therapy (Signed)
[facility] [address], [place], [address] Phone: [phone]   Fax:  [phone]  Physical Therapy Treatment  Patient Details  Name: Kenly Henckel MRN: [id number] Date of Birth: 1975-10-02 Referring Provider (PT): Teresita Madura PA   Encounter Date: 01/01/2019  PT End of Session - 01/01/19 0904    Visit Number  8    Number of Visits  13    Date for PT Re-Evaluation  01/01/19    PT Start Time  0820    PT Stop Time  0905    PT Time Calculation (min)  45 min    Activity Tolerance  Patient tolerated treatment well;Patient limited by pain    Behavior During Therapy  [facility] for tasks assessed/performed       Past Medical History:  Diagnosis Date   Anemia    s/p transfusion   Bronchitis    COPD (chronic obstructive pulmonary disease) ([place])    GERD (gastroesophageal reflux disease)    History of Papanicolaou smear of cervix 10/12/2011   neg    Past Surgical History:  Procedure Laterality Date   CESAREAN SECTION     x3   DILATION AND CURETTAGE OF UTERUS  11/11/2011   hysteroscopy   ENDOMETRIAL ABLATION  11/11/2011   TUBAL LIGATION  1997    There were no vitals filed for this visit.  Subjective Assessment - 01/01/19 0840    Subjective  Pt reports worsened, 10/10 burning, bilateral pain in her lower extremities. She states that she is anxious of test results that she is currently awaiting from her physician. Otherwise, pt reports feeling relief for 1-2 days following the last session and doing her prone exercises at home.    Pertinent History  Chronic B foot/ankle pain    Limitations  Lifting;Standing;Walking    Diagnostic tests  Nerve conduction velocity test: Normal B    Patient Stated Goals  To decrease pain    Currently in Pain?  Yes    Pain Score  10-Worst pain ever    Pain Location  Leg    Pain Orientation  Left;Right;Distal    Pain Descriptors / Indicators  Burning     Pain Type  Neuropathic pain;Chronic pain    Pain Onset  More than a month ago       Manual Therapy to address and improve her pain response/centralization and muscular tightness.  In prone position, PA mobilizations (Grades I-IV) to L4-S3 centrally and unilaterally. Soft tissue mobilizations to bilateral superior hips.  Produced concordant sx and improved pain response.  Therapeutic Exercise to address and improve her functional strength, mobility, and activity tolerance.   Prone Extensions 2x15 Quadruped Cat/cow x12 (difficulty extending thru lumbar) Standing Back Extensions 2x10 Standing Hip Extensions x12 each side Standing Hip Abductions x12 each side Ambulated 2 min Standing Mini Squats x10  At the end of session, pt reported decreased pain. Performed exercises to decrease pain and spasms         PT Education - 01/01/19 0902    Education Details  Pt educated on continued HEP as is, recommend to perform activity/ambulation every hour to reduce stiffness. Pt verbalized understanding and had concerns/questions addressed.    Person(s) Educated  Patient    Methods  Explanation;Tactile cues;Verbal cues    Comprehension  Verbalized understanding;Verbal cues required;Tactile cues required;Need further instruction          PT Long Term Goals - 11/20/18 1714  PT LONG TERM GOAL #1   Title  Patient will be able to wash her dishes without pain >5/10 to indicate significant improvement in her pain and greater ability to perform ADLs.    Baseline  10/10 pain with washing dishes    Time  6    Period  Weeks    Status  New    Target Date  01/01/19      PT LONG TERM GOAL #2   Title  Patient will be independent with HEP to continue benefits of therapy until after discharge.    Baseline  Dependent with form and technique    Time  6    Period  Weeks    Status  New    Target Date  01/01/19      PT LONG TERM GOAL #3   Title  Patient will be able to work a full shift  without pain >5/10 to indicate significant improvement with weight acceptance and improvement in function.    Baseline  10/10 pain with amb.    Time  6    Period  Weeks    Status  New    Target Date  01/01/19      PT LONG TERM GOAL #4   Title  Patient will be able to walk in the grass without LOB to better play with her grandkids.    Baseline  Unable to perform secondary to fear of falling    Time  6    Period  Weeks    Status  New    Target Date  01/01/19            Plan - 01/01/19 0905    Clinical Impression Statement  Pt continues to demonstrate increased pain response at initial onset of session. Despite pt's worsened state initially, pt tolerated manual interventions and followed active exercise. Pt demonstrated concordant sx with treatment and reported a decreased pain response. Pt able to tolerate increased standing/weight-bearing exercise, with decreased tightness and/or stiffness through involved area. Pt observed to have wider BOS and bilateral antalgic gait, as well as decreased weight-bearing through right LE (as well as decreased heel strike (R>L). Pt demonstrated decreased motor control at pelvis/hip with weight-bearing hip strengthening exercises, requiring verbal and tactile cueing to reduce/minimize compensatory strategies. Pt required thoracic tactile cueing to reduce thoracic extension moment during squatting, as well as cueing for equal weight-bearing. Pt demonstrates improved intra-session carry-over through targeted interventions through consistent tactile/verbal cueing and educated to increase activity, dispersed throughout her day, to improve overall tolerance and reduce fear-avoidance behavior. Pt will continue to benefit from skilled therapy treatment to improve her aforementioned impairments and to improve her strength, activity tolerance, reduce pain response, which will allow her to return to independence and prior level of function.    Personal Factors and  Comorbidities  Comorbidity 3+    Comorbidities  anxiety/depression, obesity, chronicity of pain    Examination-Activity Limitations  Squat;Stairs;Stand;Locomotion Level;Transfers    Examination-Participation Restrictions  Cleaning;Laundry;Community Activity    Stability/Clinical Decision Making  Evolving/Moderate complexity    Clinical Decision Making  Moderate    Rehab Potential  Fair    PT Frequency  2x / week    PT Duration  6 weeks    PT Treatment/Interventions  Electrical Stimulation;Iontophoresis 4mg /ml Dexamethasone;Moist Heat;Cryotherapy;Gait training;Therapeutic activities;Therapeutic exercise;Neuromuscular re-education;Patient/family education;Passive range of motion;Joint Manipulations;Spinal Manipulations    PT Next Visit Plan  progress mobility and posterior strengthening    PT Home Exercise Plan  Standing back extensions,  walking    Consulted and Agree with Plan of Care  Patient       Patient will benefit from skilled therapeutic intervention in order to improve the following deficits and impairments:  Abnormal gait, Impaired sensation, Decreased coordination, Decreased mobility, Increased muscle spasms, Decreased activity tolerance, Decreased endurance, Decreased range of motion, Decreased strength, Decreased balance, Difficulty walking  Visit Diagnosis: 1. Pain in right ankle and joints of right foot   2. Pain in left ankle and joints of left foot        Problem List Patient Active Problem List   Diagnosis Date Noted   Leg pain 07/11/2018   Lymphedema 07/11/2018   Menorrhagia with regular cycle 03/06/2018   Bilateral lower extremity edema 12/18/2017   Bilateral cellulitis of lower leg 12/18/2017   Paresthesia of both feet 02/16/2017   Moderate Persistent Asthma 02/18/2015   Allergic rhinitis 02/18/2015   COPD exacerbation ([place]) 12/01/2014    Scarlette Calico, SPT 01/01/2019, 9:32 AM  [facility] [address] [place], [address] Phone: [phone]   Fax:  [phone]  Name: Sameera Betton MRN: [id number] Date of Birth: 10-09-1975

## 2019-01-03 ENCOUNTER — Other Ambulatory Visit: Payer: Self-pay

## 2019-01-03 ENCOUNTER — Ambulatory Visit: Payer: BC Managed Care – PPO

## 2019-01-03 DIAGNOSIS — M25572 Pain in left ankle and joints of left foot: Secondary | ICD-10-CM

## 2019-01-03 DIAGNOSIS — M25571 Pain in right ankle and joints of right foot: Secondary | ICD-10-CM | POA: Diagnosis not present

## 2019-01-03 NOTE — Therapy (Signed)
[facility] [address], [place], [address] Phone: [phone]   Fax:  [phone]  Physical Therapy Treatment  Patient Details  Name: Kara West MRN: [id number] Date of Birth: [birth year] Referring Provider (PT): Teresita Madura PA   Encounter Date: 01/03/2019  PT End of Session - 01/03/19 1037    Visit Number  9    Number of Visits  13    Date for PT Re-Evaluation  01/01/19    PT Start Time  0945    PT Stop Time  1030    PT Time Calculation (min)  45 min    Activity Tolerance  Patient tolerated treatment well;No increased pain    Behavior During Therapy  [facility] for tasks assessed/performed       Past Medical History:  Diagnosis Date   Anemia    s/p transfusion   Bronchitis    COPD (chronic obstructive pulmonary disease) ([facility])    GERD (gastroesophageal reflux disease)    History of Papanicolaou smear of cervix 10/12/2011   neg    Past Surgical History:  Procedure Laterality Date   CESAREAN SECTION     x3   DILATION AND CURETTAGE OF UTERUS  11/11/2011   hysteroscopy   ENDOMETRIAL ABLATION  11/11/2011   TUBAL LIGATION  1997    There were no vitals filed for this visit.  Subjective Assessment - 01/03/19 1015    Subjective  Pt reports improved 7-8/10 pain, with sx only referring to her lateral aspect of right foot. Pt is reporting to PT after a 12-hour work shift.    Pertinent History  Chronic B foot/ankle pain    Limitations  Lifting;Standing;Walking    Diagnostic tests  Nerve conduction velocity test: Normal B    Patient Stated Goals  To decrease pain    Currently in Pain?  Yes    Pain Score  8     Pain Location  Foot    Pain Orientation  Lateral;Right    Pain Descriptors / Indicators  Burning;Aching    Pain Type  Chronic pain;Neuropathic pain    Pain Onset  More than a month ago    Pain Frequency  Intermittent        [facility] Adult PT Treatment/Exercise - 01/03/19  1028      Manual Therapy   Manual Therapy  Joint mobilization;Soft tissue mobilization    Joint Mobilization  prone position, PA mobilizations (Grades I-IV) to L4-S3 centrally and unilaterally    Soft tissue mobilization  L4-L5 area superficially.         Manual Therapy to address and improve her pain response/centralization and muscular spasms.   In prone position, PA mobilizations (Grades I-IV) to L4-S3 centrally and unilaterally. Soft tissue mobilizations L4-L5 area superficially.    Improved pain response without sx exacerbation.    Therapeutic Exercise to address and improve her functional strength, pain, and activity tolerance.   Prone Press Ups 2x10 with OP at L4-L5 Sitting Thoracic Rotations with OP 2x12 Supine Bridging 2x12 Supine LTR x12 each side Supine SKTC x10 each side    At the end of session, pt reported decrease in pain.      PT Education - 01/03/19 1035    Education Details  Educated on technique/form of new exercises. Also educated on plan of care and prognosis, based on new MRI findings that pt discussed with PT. Supine bridges 2x12 and LTR x12 added to HEP.    Person(s)  Educated  Patient    Methods  Explanation;Tactile cues;Verbal cues    Comprehension  Verbalized understanding;Tactile cues required;Returned demonstration;Need further instruction;Verbal cues required          PT Long Term Goals - 01/03/19 1023      PT LONG TERM GOAL #1   Title  Patient will be able to wash her dishes without pain >5/10 to indicate significant improvement in her pain and greater ability to perform ADLs.    Baseline  10/10 pain with washing dishes; 01/03/2019 improved pain but not consistently yet.    Time  6    Period  Weeks    Status  Partially Met    Target Date  02/14/19      PT LONG TERM GOAL #2   Title  Patient will be independent with HEP to continue benefits of therapy until after discharge.    Baseline  Dependent with form and technique; 01/03/2019 Met.     Time  6    Period  Weeks    Status  Achieved      PT LONG TERM GOAL #3   Title  Patient will be able to work a full shift without pain >5/10 to indicate significant improvement with weight acceptance and improvement in function.    Baseline  10/10 pain with amb.; 7/16 improving to 7-8/10 after a shift    Time  6    Period  Weeks    Status  On-going    Target Date  02/14/19      PT LONG TERM GOAL #4   Title  Patient will be able to walk in the grass without LOB to better play with her grandkids.    Baseline  Unable to perform secondary to fear of falling; 7/16 Met    Time  6    Period  Weeks    Status  Achieved            Plan - 01/03/19 1037    Clinical Impression Statement  Pt demonstrated improved pain response at the initial start of the visit, despite her just ending her 12-hour work shift. Pt able to tolerate increased workloads with exercise and manual interventions. Pt verbalized decreased overall pain without sx exacerbation throughout the entire session. Pts goals were updated, as she still reported feeling sx >5/10 pain with prolonged standing with house chores and at work, though it has improved to a 7-8/10 to just her right lateral foot, which is still a significant improvement from previous sessions (10/10 pain referred bilaterally feet). Pt reported not having any LOBs while playing with her grandkids and being independent with her HEP. Due to pts improved pain and decreased severity, her HEP was progressed to focus on lumbar and hip mobility, as well as bridging for posterior chain and core strengthening. Pt verbalized and demonstrated understanding, citing that she will continue to ambulate more on her time off, as well as her progressed HEP (including back extensions and prone on elbows). At the end of the session, pt reported a decrease in pain sx and an improvement in function. Although she has improved in pain and function, she still demonstrates impaired overall  strength, mobility, and tolerance with prolonged standing and ambulation. Pt will continue to benefit from skilled therapy treatment in order to improve her aforementioned impairments, which will allow her to return to prior level of function.    Personal Factors and Comorbidities  Comorbidity 3+    Comorbidities  anxiety/depression, obesity, chronicity of pain  Examination-Activity Limitations  Squat;Stairs;Stand;Locomotion Level;Transfers    Examination-Participation Restrictions  Cleaning;Laundry;Community Activity    Stability/Clinical Decision Making  Evolving/Moderate complexity    Clinical Decision Making  Moderate    Rehab Potential  Fair    PT Frequency  2x / week    PT Duration  6 weeks    PT Treatment/Interventions  Electrical Stimulation;Iontophoresis '4mg'$ /ml Dexamethasone;Moist Heat;Cryotherapy;Gait training;Therapeutic activities;Therapeutic exercise;Neuromuscular re-education;Patient/family education;Passive range of motion;Joint Manipulations;Spinal Manipulations    PT Next Visit Plan  progress mobility and posterior strengthening    PT Home Exercise Plan  See education section for updated HEP.    Consulted and Agree with Plan of Care  Patient       Patient will benefit from skilled therapeutic intervention in order to improve the following deficits and impairments:  Abnormal gait, Impaired sensation, Decreased coordination, Decreased mobility, Increased muscle spasms, Decreased activity tolerance, Decreased endurance, Decreased range of motion, Decreased strength, Decreased balance, Difficulty walking  Visit Diagnosis: 1. Pain in right ankle and joints of right foot   2. Pain in left ankle and joints of left foot        Problem List Patient Active Problem List   Diagnosis Date Noted   Leg pain 07/11/2018   Lymphedema 07/11/2018   Menorrhagia with regular cycle 03/06/2018   Bilateral lower extremity edema 12/18/2017   Bilateral cellulitis of lower leg  12/18/2017   Paresthesia of both feet 02/16/2017   Moderate Persistent Asthma 02/18/2015   Allergic rhinitis 02/18/2015   COPD exacerbation ([place]) 12/01/2014    Scarlette Calico, SPT 01/03/2019, 10:49 AM  [facility] [address] [place], [address] Phone: [phone]   Fax:  [phone]  Name: Kara West MRN: [id number] Date of Birth: 30-Nov-1975

## 2019-01-08 ENCOUNTER — Ambulatory Visit: Payer: BC Managed Care – PPO

## 2019-01-09 DIAGNOSIS — G43719 Chronic migraine without aura, intractable, without status migrainosus: Secondary | ICD-10-CM | POA: Diagnosis not present

## 2019-01-09 DIAGNOSIS — M5416 Radiculopathy, lumbar region: Secondary | ICD-10-CM | POA: Diagnosis not present

## 2019-01-10 ENCOUNTER — Ambulatory Visit: Payer: BLUE CROSS/BLUE SHIELD | Admitting: Obstetrics and Gynecology

## 2019-01-10 ENCOUNTER — Ambulatory Visit: Payer: BC Managed Care – PPO

## 2019-01-10 NOTE — Progress Notes (Deleted)
Donnie Coffin, MD   No chief complaint on file.   HPI:      Ms. Kara West is a 43 y.o. [phone] who LMP was No LMP recorded., presents today for *** Last pap 9/19.  Past Medical History:  Diagnosis Date  . Anemia    s/p transfusion  . Bronchitis   . COPD (chronic obstructive pulmonary disease) ([place])   . GERD (gastroesophageal reflux disease)   . History of Papanicolaou smear of cervix 10/12/2011   neg    Past Surgical History:  Procedure Laterality Date  . CESAREAN SECTION     x3  . DILATION AND CURETTAGE OF UTERUS  11/11/2011   hysteroscopy  . ENDOMETRIAL ABLATION  11/11/2011  . TUBAL LIGATION  1997    Family History  Problem Relation Age of Onset  . Hypertension Mother   . Diabetes Mother     Social History   Socioeconomic History  . Marital status: Single    Spouse name: Not on file  . Number of children: Not on file  . Years of education: Not on file  . Highest education level: Not on file  Occupational History  . Not on file  Social Needs  . Financial resource strain: Not on file  . Food insecurity    Worry: Not on file    Inability: Not on file  . Transportation needs    Medical: Not on file    Non-medical: Not on file  Tobacco Use  . Smoking status: Former Smoker    Packs/day: 0.20    Years: 5.00    Pack years: 1.00    Types: Cigarettes    Quit date: 06/11/2014    Years since quitting: 4.5  . Smokeless tobacco: Never Used  Substance and Sexual Activity  . Alcohol use: Yes    Alcohol/week: 0.0 standard drinks    Comment: occ  . Drug use: No  . Sexual activity: Yes    Birth control/protection: Surgical    Comment: tubal  Lifestyle  . Physical activity    Days per week: Not on file    Minutes per session: Not on file  . Stress: Not on file  Relationships  . Social Herbalist on phone: Not on file    Gets together: Not on file    Attends religious service: Not on file    Active member of club or  organization: Not on file    Attends meetings of clubs or organizations: Not on file    Relationship status: Not on file  . Intimate partner violence    Fear of current or ex partner: Not on file    Emotionally abused: Not on file    Physically abused: Not on file    Forced sexual activity: Not on file  Other Topics Concern  . Not on file  Social History Narrative  . Not on file    Outpatient Medications Prior to Visit  Medication Sig Dispense Refill  . albuterol (PROVENTIL HFA;VENTOLIN HFA) 108 (90 Base) MCG/ACT inhaler Inhale 2 puffs into the lungs every 6 (six) hours as needed for wheezing or shortness of breath. 1 Inhaler 2  . benzonatate (TESSALON PERLES) 100 MG capsule Take 2 capsules (200 mg total) by mouth 3 (three) times daily as needed. 30 capsule 0  . chlorpheniramine-HYDROcodone (TUSSIONEX PENNKINETIC ER) 10-8 MG/5ML SUER Take 5 mLs by mouth 2 (two) times daily. 115 mL 0  . doxycycline (VIBRA-TABS) 100 MG tablet  Take 1 tablet (100 mg total) by mouth 2 (two) times daily. 14 tablet 0  . ferrous sulfate 325 (65 FE) MG tablet Take by mouth.    . gabapentin (NEURONTIN) 100 MG capsule Take 1 capsule (100 mg total) by mouth 3 (three) times daily. 90 capsule 0  . LYRICA 75 MG capsule   0  . methylPREDNISolone (MEDROL DOSEPAK) 4 MG TBPK tablet Take Tapered dose as directed 21 tablet 0  . predniSONE (DELTASONE) 50 MG tablet Take 1 tablet (50 mg total) by mouth daily with breakfast. 5 tablet 0  . Spacer/Aero-Holding Chambers (AEROCHAMBER MV) inhaler Use as instructed 1 each 0  . thiamine 100 MG tablet Take by mouth.     No facility-administered medications prior to visit.       ROS:  Review of Systems BREAST: No symptoms   OBJECTIVE:   Vitals:  There were no vitals taken for this visit.  Physical Exam  Results: No results found for this or any previous visit (from the past 24 hour(s)).   Assessment/Plan: No diagnosis found.    No orders of the defined types  were placed in this encounter.     No follow-ups on file.  Alicia B. Copland, PA-C 01/10/2019 8:42 AM

## 2019-01-15 ENCOUNTER — Other Ambulatory Visit: Payer: Self-pay

## 2019-01-15 ENCOUNTER — Ambulatory Visit: Payer: BC Managed Care – PPO

## 2019-01-15 DIAGNOSIS — M25571 Pain in right ankle and joints of right foot: Secondary | ICD-10-CM | POA: Diagnosis not present

## 2019-01-15 DIAGNOSIS — M25572 Pain in left ankle and joints of left foot: Secondary | ICD-10-CM

## 2019-01-15 NOTE — Therapy (Signed)
[facility] [address] [place], [address] Phone: [phone]   Fax:  [phone]  Physical Therapy Treatment  Patient Details  Name: Kara West MRN: [id number] Date of Birth: Feb 07, 1976 Referring Provider (PT): Teresita Madura PA   Encounter Date: 01/15/2019  PT End of Session - 01/15/19 1218    Visit Number  10    Number of Visits  13    Date for PT Re-Evaluation  02/12/19    PT Start Time  0945    PT Stop Time  1030    PT Time Calculation (min)  45 min    Activity Tolerance  Patient tolerated treatment well;No increased pain    Behavior During Therapy  [facility] for tasks assessed/performed       Past Medical History:  Diagnosis Date   Anemia    s/p transfusion   Bronchitis    COPD (chronic obstructive pulmonary disease) ([facility])    GERD (gastroesophageal reflux disease)    History of Papanicolaou smear of cervix 10/12/2011   neg    Past Surgical History:  Procedure Laterality Date   CESAREAN SECTION     x3   DILATION AND CURETTAGE OF UTERUS  11/11/2011   hysteroscopy   ENDOMETRIAL ABLATION  11/11/2011   TUBAL LIGATION  1997    There were no vitals filed for this visit.  Subjective Assessment - 01/15/19 1215    Subjective  Pt reports 10/10 bilateral shooting pain to her feet. She has a visit with her pain specialist this Friday for consultation. She says her supine exercises and a recent tramadol shot has helped.    Pertinent History  Chronic B foot/ankle pain    Limitations  Lifting;Standing;Walking    Diagnostic tests  Nerve conduction velocity test: Normal B    Patient Stated Goals  To decrease pain    Currently in Pain?  Yes    Pain Score  10-Worst pain ever    Pain Location  Leg    Pain Orientation  Right;Left;Lower    Pain Descriptors / Indicators  Burning    Pain Type  Neuropathic pain;Chronic pain    Pain Onset  More than a month ago      TREATMENT   Manual Therapy to address and improve her pain response/centralization and muscular spasms.   In prone position, PA mobilizations (Grades I-IV) to L2-S2 centrally and unilaterally. Soft tissue mobilizations L2-L5 area superficially. In seated position, AP talocrural joint mobs (Grades I-III)  Therapeutic Exercise to address and improve her functional strength, pain, and activity tolerance.   Prone Press Ups 2x10 with OP at L4-L5 Slump with ankle DF/PF OP x10 each side Ambulated 200 feet  7/10 decrease in pain (significant*)  Supine Bridging 2x12 Supine LTR x12 each side     At the end of session, pt reported decreased 6/10 pain.       PT Education - 01/15/19 1217    Education Details  Pt educated on technique/form. Seated slump bilateral 1x10 added to her HEP.    Person(s) Educated  Patient    Methods  Explanation;Demonstration;Tactile cues;Verbal cues;Handout    Comprehension  Verbalized understanding;Returned demonstration;Verbal cues required;Tactile cues required          PT Long Term Goals - 01/03/19 1023      PT LONG TERM GOAL #1   Title  Patient will be able to wash her dishes without pain >5/10 to indicate significant improvement in her pain  and greater ability to perform ADLs.    Baseline  10/10 pain with washing dishes; 01/03/2019 improved pain but not consistently yet.    Time  6    Period  Weeks    Status  Partially Met    Target Date  02/14/19      PT LONG TERM GOAL #2   Title  Patient will be independent with HEP to continue benefits of therapy until after discharge.    Baseline  Dependent with form and technique; 01/03/2019 Met.    Time  6    Period  Weeks    Status  Achieved      PT LONG TERM GOAL #3   Title  Patient will be able to work a full shift without pain >5/10 to indicate significant improvement with weight acceptance and improvement in function.    Baseline  10/10 pain with amb.; 7/16 improving to 7-8/10 after a shift    Time  6    Period   Weeks    Status  On-going    Target Date  02/14/19      PT LONG TERM GOAL #4   Title  Patient will be able to walk in the grass without LOB to better play with her grandkids.    Baseline  Unable to perform secondary to fear of falling; 7/16 Met    Time  6    Period  Weeks    Status  Achieved            Plan - 01/15/19 1331    Clinical Impression Statement  Pt responded significantly with targeted manual therapy interventions to her lumbosacral region and bilateral ankle joints. Pt also responded with a position bilateral seated slump test, to which she was given as an addition to her HEP. Immediately following manual therapy, the slump exercise (x10), and prone press ups (2x10), pt was able to demonstrate 7/10 pain, indicating a significant pain reduction. With additional strengthening/ROM exercises, pt finished the session with 6/10 pain. Pt continues to demonstrate increased bilateral radicular sx (R>L), impaired strength, and mobility. Pt will continue to benefit from skilled therapy treatment to improve her aforementioned impairements, which will allow her to return to prior level of function.    Personal Factors and Comorbidities  Comorbidity 3+    Comorbidities  anxiety/depression, obesity, chronicity of pain    Examination-Activity Limitations  Squat;Stairs;Stand;Locomotion Level;Transfers    Examination-Participation Restrictions  Cleaning;Laundry;Community Activity    Stability/Clinical Decision Making  Evolving/Moderate complexity    Rehab Potential  Fair    PT Frequency  2x / week    PT Duration  6 weeks    PT Treatment/Interventions  Electrical Stimulation;Iontophoresis '4mg'$ /ml Dexamethasone;Moist Heat;Cryotherapy;Gait training;Therapeutic activities;Therapeutic exercise;Neuromuscular re-education;Patient/family education;Passive range of motion;Joint Manipulations;Spinal Manipulations    PT Next Visit Plan  progress mobility and posterior strengthening    PT Home Exercise  Plan  See education section for updated HEP.    Consulted and Agree with Plan of Care  Patient       Patient will benefit from skilled therapeutic intervention in order to improve the following deficits and impairments:  Abnormal gait, Impaired sensation, Decreased coordination, Decreased mobility, Increased muscle spasms, Decreased activity tolerance, Decreased endurance, Decreased range of motion, Decreased strength, Decreased balance, Difficulty walking  Visit Diagnosis: 1. Pain in right ankle and joints of right foot   2. Pain in left ankle and joints of left foot        Problem List Patient Active Problem List  Diagnosis Date Noted   Leg pain 07/11/2018   Lymphedema 07/11/2018   Menorrhagia with regular cycle 03/06/2018   Bilateral lower extremity edema 12/18/2017   Bilateral cellulitis of lower leg 12/18/2017   Paresthesia of both feet 02/16/2017   Moderate Persistent Asthma 02/18/2015   Allergic rhinitis 02/18/2015   COPD exacerbation ([place]) 12/01/2014    Scarlette Calico, SPT 01/15/2019, 1:39 PM  [facility] [address], [place], [address] Phone: [phone]   Fax:  [phone]  Name: Liesel Peckenpaugh MRN: [id number] Date of Birth: 05-25-1976

## 2019-01-15 NOTE — Addendum Note (Signed)
Addended by: Blain Pais on: 01/15/2019 09:50 AM   Modules accepted: Orders

## 2019-01-16 ENCOUNTER — Ambulatory Visit (INDEPENDENT_AMBULATORY_CARE_PROVIDER_SITE_OTHER): Payer: Medicaid Other | Admitting: Nurse Practitioner

## 2019-01-17 ENCOUNTER — Ambulatory Visit: Payer: BC Managed Care – PPO

## 2019-01-18 DIAGNOSIS — M5136 Other intervertebral disc degeneration, lumbar region: Secondary | ICD-10-CM | POA: Diagnosis not present

## 2019-01-18 DIAGNOSIS — M5416 Radiculopathy, lumbar region: Secondary | ICD-10-CM | POA: Diagnosis not present

## 2019-01-22 DIAGNOSIS — G5793 Unspecified mononeuropathy of bilateral lower limbs: Secondary | ICD-10-CM | POA: Diagnosis not present

## 2019-01-30 DIAGNOSIS — M5416 Radiculopathy, lumbar region: Secondary | ICD-10-CM | POA: Diagnosis not present

## 2019-01-30 DIAGNOSIS — M5136 Other intervertebral disc degeneration, lumbar region: Secondary | ICD-10-CM | POA: Diagnosis not present

## 2019-02-20 DIAGNOSIS — M5416 Radiculopathy, lumbar region: Secondary | ICD-10-CM | POA: Diagnosis not present

## 2019-02-20 DIAGNOSIS — M5136 Other intervertebral disc degeneration, lumbar region: Secondary | ICD-10-CM | POA: Diagnosis not present

## 2019-04-15 NOTE — Progress Notes (Signed)
Patient's Name: Kara West  MRN: [id number]  Referring Provider: Harvest Dark, FNP  DOB: 03-28-1976  PCP: Donnie Coffin, MD  DOS: 04/16/2019  Note by: Gillis Santa, MD  Service setting: Ambulatory outpatient  Specialty: Interventional Pain Management  Location: [facility] (AMB) Pain Management Facility  Visit type: Initial Patient Evaluation  Patient type: New Patient   Primary Reason(s) for Visit: Encounter for initial evaluation of one or more chronic problems (new to examiner) potentially causing chronic pain, and posing a threat to normal musculoskeletal function. (Level of risk: High) CC: Leg Pain (bilateral lower legs and ankles) and Back Pain (lower)  HPI  Kara West is a 43 y.o. year old, female patient, who comes today to see [place] for the first time for an initial evaluation of her chronic pain. She has COPD exacerbation ([place]); Moderate Persistent Asthma; Allergic rhinitis; Paresthesia of both feet; Bilateral lower extremity edema; Bilateral cellulitis of lower leg; Menorrhagia with regular cycle; Leg pain; Lymphedema; Bilateral leg pain; Sensory polyneuropathy; Chronic pain syndrome; and Chronic radicular lumbar pain on their problem list. Today she comes in for evaluation of her Leg Pain (bilateral lower legs and ankles) and Back Pain (lower)  Pain Assessment: Location: Lower Leg Radiating: both legs to the feet Onset: More than a month ago Duration: Chronic pain Quality: Constant, Throbbing Severity: 10-Worst pain ever/10 (subjective, self-reported pain score)  Note: Reported level is inconsistent with clinical observations.                         When using our objective Pain Scale, levels between 6 and 10/10 are said to belong in an emergency room, as it progressively worsens from a 6/10, described as severely limiting, requiring emergency care not usually available at an outpatient pain management facility. At a 6/10 level, communication becomes difficult and requires  great effort. Assistance to reach the emergency department may be required. Facial flushing and profuse sweating along with potentially dangerous increases in heart rate and blood pressure will be evident. Effect on ADL: difficulty performing daily activities Timing: Constant Modifying factors: nothing BP: 110/71  HR: 81  Onset and Duration: Gradual and Present longer than 3 months Cause of pain: Unknown Severity: Getting worse Timing: Afternoon, Evening, During activity or exercise and After activity or exercise Aggravating Factors: Prolonged standing, Walking and Working Alleviating Factors: Medications Associated Problems: Depression, Numbness, Personality changes, Weakness, Pain that wakes patient up and Pain that does not allow patient to sleep Quality of Pain: Burning, Constant, Getting longer and Tingling Previous Examinations or Tests: MRI scan and Nerve conduction test Previous Treatments: Epidural steroid injections  The patient comes into the clinics today for the first time for a chronic pain management evaluation.   Patient is a 43 year old female with a history of obesity who presents with a chief complaint of bilateral leg pain (R>L) which started approximately 2 years ago without any inciting or provoking event.  This is present bilaterally both along the anterior and posterior aspect of her legs down to her ankles.  Patient has undergone a nerve conduction study which revealed sensory polyneuropathy.  She has tried compression devices on her legs.  She has tried wraps for her lower extremities which were not helpful.  She has also tried physical therapy which did not help.  She has been evaluated by neurosurgery who did not recommend surgical intervention.  She has also undergone a bilateral L4-L5 transforaminal epidural steroid injection which she states was  not very helpful.  She denies any bowel or bladder dysfunction.  She has 7 grandchildren and is motivated to regain her  functional abilities so that she can play with them.  She is not on chronic opioid therapy.   Historic Controlled Substance Pharmacotherapy Review   Historical Monitoring: The patient  reports no history of drug use. List of all UDS Test(s): No results found for: MDMA, COCAINSCRNUR, [place], [place], CANNABQUANT, THCU, [place] List of other Serum/Urine Drug Screening Test(s):  No results found for: AMPHSCRSER, BARBSCRSER, BENZOSCRSER, COCAINSCRSER, COCAINSCRNUR, PCPSCRSER, PCPQUANT, THCSCRSER, THCU, CANNABQUANT, OPIATESCRSER, OXYSCRSER, PROPOXSCRSER, ETH Historical Background Evaluation: [place] PMP: PDMP reviewed during this encounter. Six (6) year initial data search conducted.             [place] Department of public safety, offender search: Editor, commissioning Information) Non-contributory Risk Assessment Profile: Aberrant behavior: None observed or detected today Risk factors for fatal opioid overdose: age 46-72 years old Fatal overdose hazard ratio (HR): Calculation deferred Non-fatal overdose hazard ratio (HR): Calculation deferred Risk of opioid abuse or dependence: 0.7-3.0% with doses ? 36 MME/day and 6.1-26% with doses ? 120 MME/day. Substance use disorder (SUD) risk level: See below Personal History of Substance Abuse (SUD-Substance use disorder):  Alcohol: Negative  Illegal Drugs: Negative  Rx Drugs: Negative  ORT Risk Level calculation: Low Risk Opioid Risk Tool - 04/16/19 1304      Family History of Substance Abuse   Alcohol  Negative    Illegal Drugs  Negative    Rx Drugs  Negative      Personal History of Substance Abuse   Alcohol  Negative    Illegal Drugs  Negative    Rx Drugs  Negative      Age   Age between 21-45 years   Yes      History of Preadolescent Sexual Abuse   History of Preadolescent Sexual Abuse  Negative or Female      Psychological Disease   Psychological Disease  Negative    Depression  Negative      Total Score   Opioid Risk Tool Scoring  1    Opioid Risk  Interpretation  Low Risk      ORT Scoring interpretation table:  Score <3 = Low Risk for SUD  Score between 4-7 = Moderate Risk for SUD  Score >8 = High Risk for Opioid Abuse   PHQ-2 Depression Scale:  Total score: 0  PHQ-2 Scoring interpretation table: (Score and probability of major depressive disorder)  Score 0 = No depression  Score 1 = 15.4% Probability  Score 2 = 21.1% Probability  Score 3 = 38.4% Probability  Score 4 = 45.5% Probability  Score 5 = 56.4% Probability  Score 6 = 78.6% Probability   PHQ-9 Depression Scale:  Total score: 0  PHQ-9 Scoring interpretation table:  Score 0-4 = No depression  Score 5-9 = Mild depression  Score 10-14 = Moderate depression  Score 15-19 = Moderately severe depression  Score 20-27 = Severe depression (2.4 times higher risk of SUD and 2.89 times higher risk of overuse)   Pharmacologic Plan: As per protocol, I have not taken over any controlled substance management, pending the results of ordered tests and/or consults.            Initial impression: Pending review of available data and ordered tests.  Meds   Current Outpatient Medications:  .  albuterol (PROVENTIL HFA;VENTOLIN HFA) 108 (90 Base) MCG/ACT inhaler, Inhale 2 puffs into the lungs every 6 (  six) hours as needed for wheezing or shortness of breath., Disp: 1 Inhaler, Rfl: 2 .  diclofenac (VOLTAREN) 75 MG EC tablet, Take 75 mg by mouth 2 (two) times daily., Disp: , Rfl:  .  Fremanezumab-vfrm (AJOVY) 225 MG/1.5ML SOSY, Inject into the skin., Disp: , Rfl:  .  Spacer/Aero-Holding Chambers (AEROCHAMBER MV) inhaler, Use as instructed, Disp: 1 each, Rfl: 0 .  topiramate (TOPAMAX) 50 MG tablet, TAKE 1 2 (ONE HALF) TABLET BY MOUTH NIGHTLY FOR 14 DAYS THEN INCREASE TO 1 TABLET ONCE DAILY AT NIGHT, Disp: , Rfl:  .  amitriptyline (ELAVIL) 50 MG tablet, Take 1 tablet (50 mg total) by mouth at bedtime., Disp: 30 tablet, Rfl: 2 .  celecoxib (CELEBREX) 100 MG capsule, Take 1 capsule (100 mg  total) by mouth 2 (two) times daily., Disp: 60 capsule, Rfl: 2 .  DULoxetine (CYMBALTA) 60 MG capsule, TAKE 1 CAPSULE BY MOUTH ONCE DAILY FOR MOOD, Disp: , Rfl:  .  ferrous sulfate 325 (65 FE) MG tablet, Take by mouth., Disp: , Rfl:  .  gabapentin (NEURONTIN) 600 MG tablet, Take 1 tablet (600 mg total) by mouth 3 (three) times daily., Disp: 90 tablet, Rfl: 2 .  rizatriptan (MAXALT) 10 MG tablet, TAKE ONE TABLET BY MOUTH ONCE AS NEEDED FOR MIGRAINE. MAY TAKE A SECOND DOSE AFTER 2 HOURS IF NEEDED., Disp: , Rfl:  .  thiamine 100 MG tablet, Take by mouth., Disp: , Rfl:   Imaging Review  Lumbosacral Imaging: Lumbar MR wo contrast:  Results for orders placed during the hospital encounter of 12/26/18  MR LUMBAR SPINE WO CONTRAST   Narrative CLINICAL DATA:  Initial evaluation for bilateral foot pain, numbness and tingling for 2 years, right worse than left.  EXAM: MRI LUMBAR SPINE WITHOUT CONTRAST  TECHNIQUE: Multiplanar, multisequence MR imaging of the lumbar spine was performed. No intravenous contrast was administered.  COMPARISON:  None.  FINDINGS: Segmentation: Standard. Lowest well-formed disc labeled the L5-S1 level.  Alignment: Physiologic with preservation of the normal lumbar lordosis.  Vertebrae: Vertebral body height maintained without evidence for acute or chronic fracture. Bone marrow signal intensity within normal limits. Benign hemangioma largely replaces the T12 vertebral body. No other discrete or worrisome osseous lesions. No abnormal marrow edema.  Conus medullaris and cauda equina: Conus extends to the L2 level. Conus and cauda equina appear normal.  Paraspinal and other soft tissues: Paraspinous soft tissues within normal limits. Subcentimeter T2 hyperintense simple right renal cyst noted. Visualized visceral structures otherwise unremarkable.  Disc levels:  L1-2:  Unremarkable.  L2-3: Negative interspace. Mild bilateral facet hypertrophy. No stenosis or  impingement.  L3-4: Negative interspace. Mild to moderate facet hypertrophy. No canal or foraminal stenosis.  L4-5: Mild diffuse disc bulge with disc desiccation. Disc bulging slightly asymmetric to the left. Mild facet and ligament flavum hypertrophy. No canal or lateral recess stenosis. Mild left foraminal narrowing, with the bulging disc closely approximating the exiting left L4 nerve root (series 6, image 13). Right neural foramen remains patent.  L5-S1:  Unremarkable.  IMPRESSION: 1. Shallow left eccentric disc bulge at L4-5, closely approximating and potentially irritating the exiting left L4 nerve root. 2. Mild to moderate facet hypertrophy at L2-3 through L4-5, which could contribute to underlying back pain.   Electronically Signed   By: Jeannine Boga M.D.   On: 12/27/2018 05:07       Foot Imaging: Foot-R DG Complete:  Results for orders placed during the hospital encounter of 02/14/17  DG Foot Complete  Right   Narrative CLINICAL DATA:  Burning tingling.  No injury .  EXAM: RIGHT FOOT COMPLETE - 3+ VIEW  COMPARISON:  No recent .  FINDINGS: Soft tissue structures are unremarkable. Mild degenerative changes first MTP joint. No acute bony or joint abnormality identified.  IMPRESSION: Mild degenerative changes first MTP joint, otherwise negative exam.   Electronically Signed   By: Marcello Moores  Register   On: 02/15/2017 07:59    Foot-L DG Complete:  Results for orders placed during the hospital encounter of 02/14/17  DG Foot Complete Left   Narrative CLINICAL DATA:  Burning tingling.  No injury.  EXAM: LEFT FOOT - COMPLETE 3+ VIEW  FINDINGS: Soft tissue structures are unremarkable. No acute bony or joint abnormality identified. Degenerative changes first metatarsophalangeal joint.  IMPRESSION: Degenerative changes first MTP joint.  No acute abnormality .   Electronically Signed   By: Marcello Moores  Register   On: 02/15/2017 07:58      Complexity Note: Imaging results reviewed. Results shared with Kara West, using Layman's terms.                         ROS  Cardiovascular: No reported cardiovascular signs or symptoms such as High blood pressure, coronary artery disease, abnormal heart rate or rhythm, heart attack, blood thinner therapy or heart weakness and/or failure Pulmonary or Respiratory: Difficulty blowing air out (Emphysema) and Coughing up mucus (Bronchitis) Neurological: No reported neurological signs or symptoms such as seizures, abnormal skin sensations, urinary and/or fecal incontinence, being born with an abnormal open spine and/or a tethered spinal cord Review of Past Neurological Studies:  Results for orders placed or performed during the hospital encounter of 07/17/18  MR BRAIN W WO CONTRAST   Narrative   CLINICAL DATA:  History of left frontal meningioma. Sudden onset of right-sided headache 1 month ago.  EXAM: MRI HEAD WITHOUT AND WITH CONTRAST  TECHNIQUE: Multiplanar, multiecho pulse sequences of the brain and surrounding structures were obtained without and with intravenous contrast.  CONTRAST:  10 cc Gadavist  COMPARISON:  06/27/2017  FINDINGS: Brain: Diffusion imaging does not show any acute or subacute infarction. No significant brain parenchymal finding. No infarction, mass lesion, hemorrhage, hydrocephalus or extra-axial collection. 6 mm meningioma at the left frontal convexity again demonstrated without significant mass-effect upon the brain. This is likely to be asymptomatic and has not shown any enlargement since last year.  Vascular: Major vessels at the base of the brain show flow.  Skull and upper cervical spine: Negative  Sinuses/Orbits: Pronounced sinus inflammatory changes, affecting all the paranasal sinuses but most severe in the left maxillary, ethmoid and frontal sinuses. Findings could be associated with headache.  Other: None  IMPRESSION: No acute brain  finding.  Stable 6 mm meningioma at the left frontal convexity without significant mass-effect upon the brain and without growth since last year.  Inflammatory changes of the paranasal sinuses, left worse than right, which could be associated with headache.   Electronically Signed   By: Nelson Chimes M.D.   On: 07/17/2018 13:43    Psychological-Psychiatric: Anxiousness and Difficulty sleeping and or falling asleep Gastrointestinal: No reported gastrointestinal signs or symptoms such as vomiting or evacuating blood, reflux, heartburn, alternating episodes of diarrhea and constipation, inflamed or scarred liver, or pancreas or irrregular and/or infrequent bowel movements Genitourinary: No reported renal or genitourinary signs or symptoms such as difficulty voiding or producing urine, peeing blood, non-functioning kidney, kidney stones, difficulty emptying the bladder, difficulty  controlling the flow of urine, or chronic kidney disease Hematological: No reported hematological signs or symptoms such as prolonged bleeding, low or poor functioning platelets, bruising or bleeding easily, hereditary bleeding problems, low energy levels due to low hemoglobin or being anemic Endocrine: No reported endocrine signs or symptoms such as high or low blood sugar, rapid heart rate due to high thyroid levels, obesity or weight gain due to slow thyroid or thyroid disease Rheumatologic: No reported rheumatological signs and symptoms such as fatigue, joint pain, tenderness, swelling, redness, heat, stiffness, decreased range of motion, with or without associated rash Musculoskeletal: Negative for myasthenia gravis, muscular dystrophy, multiple sclerosis or malignant hyperthermia Work History: Working full time  Allergies  Kara West has No Known Allergies.  Laboratory Chemistry Profile   Screening Lab Results  Component Value Date   PREGTESTUR NEGATIVE 11/19/2017    Inflammation (CRP: Acute Phase)  (ESR: Chronic Phase) No results found for: CRP, ESRSEDRATE, LATICACIDVEN                       Rheumatology Lab Results  Component Value Date   ANA POS (A) 02/14/2017                        Renal Lab Results  Component Value Date   BUN 17 08/31/2018   CREATININE 0.64 08/31/2018   GFRAA >60 08/31/2018   GFRNONAA >60 08/31/2018                             Hepatic Lab Results  Component Value Date   AST 17 11/19/2017   ALT 13 (L) 11/19/2017   ALBUMIN 3.9 11/19/2017   ALKPHOS 63 11/19/2017   LIPASE 15 (L) 10/27/2014                        Electrolytes Lab Results  Component Value Date   NA 137 08/31/2018   K 3.1 (L) 08/31/2018   CL 109 08/31/2018   CALCIUM 8.8 (L) 08/31/2018   MG 2.1 06/19/2018                        Neuropathy Lab Results  Component Value Date   VITAMINB12 544 02/14/2017   FOLATE 8.2 02/14/2017   HGBA1C 5.0 02/14/2017                        CNS No results found for: COLORCSF, APPEARCSF, RBCCOUNTCSF, WBCCSF, POLYSCSF, LYMPHSCSF, EOSCSF, PROTEINCSF, GLUCCSF, JCVIRUS, CSFOLI, IGGCSF, LABACHR, ACETBL                      Bone Lab Results  Component Value Date   VD25OH 23 (L) 02/14/2017                         Coagulation Lab Results  Component Value Date   PLT 201 08/31/2018                        Cardiovascular Lab Results  Component Value Date   BNP 41.0 11/19/2017   CKTOTAL 39 06/25/2016   CKMB < 0.5 (L) 06/16/2012   TROPONINI <0.03 08/31/2018   HGB 12.8 08/31/2018   HCT 38.9 08/31/2018  ID Lab Results  Component Value Date   PREGTESTUR NEGATIVE 11/19/2017   MICROTEXT  03/18/2014       SOURCE: CLEAN CATCH    ORGANISM 1                >100,000 CFU/ML ESCHERICHIA COLI   ANTIBIOTIC                    ORG#1     AMPICILLIN                    R         CEFAZOLIN                     S         CEFOXITIN                     S         CEFTRIAXONE                   S         CIPROFLOXACIN                 S          GENTAMICIN                    S         IMIPENEM                      S         LEVOFLOXACIN                  S         NITROFURANTOIN                S         TRIMETHOPRIM/SULFAMETHOXAZOLE S            Endocrine Lab Results  Component Value Date   TSH 1.34 02/14/2017                        Note: Lab results reviewed.  [place]  Drug: Kara West  reports no history of drug use. Alcohol:  reports current alcohol use. Tobacco:  reports that she quit smoking about 4 years ago. Her smoking use included cigarettes. She has a 1.00 pack-year smoking history. She has never used smokeless tobacco. Medical:  has a past medical history of Anemia, Bronchitis, COPD (chronic obstructive pulmonary disease) ([place]), GERD (gastroesophageal reflux disease), and History of Papanicolaou smear of cervix (10/12/2011). Family: family history includes Diabetes in her mother; Hypertension in her mother.  Past Surgical History:  Procedure Laterality Date  . CESAREAN SECTION     x3  . DILATION AND CURETTAGE OF UTERUS  11/11/2011   hysteroscopy  . ENDOMETRIAL ABLATION  11/11/2011  . TUBAL LIGATION  1997   Active Ambulatory Problems    Diagnosis Date Noted  . COPD exacerbation ([place]) 12/01/2014  . Moderate Persistent Asthma 02/18/2015  . Allergic rhinitis 02/18/2015  . Paresthesia of both feet 02/16/2017  . Bilateral lower extremity edema 12/18/2017  . Bilateral cellulitis of lower leg 12/18/2017  . Menorrhagia with regular cycle 03/06/2018  . Leg pain 07/11/2018  . Lymphedema 07/11/2018  . Bilateral leg pain 11/14/2018  . Sensory polyneuropathy 08/07/2017  . Chronic pain syndrome 04/16/2019  . Chronic  radicular lumbar pain 04/16/2019   Resolved Ambulatory Problems    Diagnosis Date Noted  . No Resolved Ambulatory Problems   Past Medical History:  Diagnosis Date  . Anemia   . Bronchitis   . COPD (chronic obstructive pulmonary disease) ([place])   . GERD (gastroesophageal reflux disease)    . History of Papanicolaou smear of cervix 10/12/2011   Constitutional Exam  General appearance: alert, cooperative and moderately obese Vitals:   04/16/19 1253  BP: 110/71  Pulse: 81  Resp: 18  Temp: 98.1 F (36.7 C)  TempSrc: Temporal  SpO2: 100%  Weight: 225 lb (102.1 kg)  Height: _0  (1.753 m)   BMI Assessment: Estimated body mass index is 33.23 kg/m as calculated from the following:   Height as of this encounter: _1  (1.753 m).   Weight as of this encounter: 225 lb (102.1 kg).  BMI interpretation table: BMI level Category Range association with higher incidence of chronic pain  <18 kg/m2 Underweight   18.5-24.9 kg/m2 Ideal body weight   25-29.9 kg/m2 Overweight Increased incidence by 20%  30-34.9 kg/m2 Obese (Class I) Increased incidence by 68%  35-39.9 kg/m2 Severe obesity (Class II) Increased incidence by 136%  >40 kg/m2 Extreme obesity (Class III) Increased incidence by 254%   Patient's current BMI Ideal Body weight  Body mass index is 33.23 kg/m. Ideal body weight: 66.2 kg (145 lb 15.1 oz) Adjusted ideal body weight: 80.5 kg (177 lb 9.1 oz)   BMI Readings from Last 4 Encounters:  04/16/19 33.23 kg/m  08/31/18 32.78 kg/m  07/18/18 31.96 kg/m  07/13/18 31.45 kg/m   Wt Readings from Last 4 Encounters:  04/16/19 225 lb (102.1 kg)  08/31/18 222 lb (100.7 kg)  07/18/18 216 lb 6.4 oz (98.2 kg)  07/13/18 213 lb (96.6 kg)  Psych/Mental status: Alert, oriented x 3 (person, place, & time)       Eyes: PERLA Respiratory: No evidence of acute respiratory distress  Cervical Spine Area Exam  Skin & Axial Inspection: No masses, redness, edema, swelling, or associated skin lesions Alignment: Symmetrical Functional ROM: Unrestricted ROM      Stability: No instability detected Muscle Tone/Strength: Functionally intact. No obvious neuro-muscular anomalies detected. Sensory (Neurological): Unimpaired Palpation: No palpable anomalies              Upper Extremity  (UE) Exam    Side: Right upper extremity  Side: Left upper extremity  Skin & Extremity Inspection: Skin color, temperature, and hair growth are WNL. No peripheral edema or cyanosis. No masses, redness, swelling, asymmetry, or associated skin lesions. No contractures.  Skin & Extremity Inspection: Skin color, temperature, and hair growth are WNL. No peripheral edema or cyanosis. No masses, redness, swelling, asymmetry, or associated skin lesions. No contractures.  Functional ROM: Unrestricted ROM          Functional ROM: Unrestricted ROM          Muscle Tone/Strength: Functionally intact. No obvious neuro-muscular anomalies detected.  Muscle Tone/Strength: Functionally intact. No obvious neuro-muscular anomalies detected.  Sensory (Neurological): Unimpaired          Sensory (Neurological): Unimpaired          Palpation: No palpable anomalies              Palpation: No palpable anomalies              Provocative Test(s):  Phalen's test: deferred Tinel's test: deferred Apley's scratch test (touch opposite shoulder):  Action 1 (Across chest): deferred  Action 2 (Overhead): deferred Action 3 (LB reach): deferred   Provocative Test(s):  Phalen's test: deferred Tinel's test: deferred Apley's scratch test (touch opposite shoulder):  Action 1 (Across chest): deferred Action 2 (Overhead): deferred Action 3 (LB reach): deferred    Thoracic Spine Area Exam  Skin & Axial Inspection: No masses, redness, or swelling Alignment: Symmetrical Functional ROM: Unrestricted ROM Stability: No instability detected Muscle Tone/Strength: Functionally intact. No obvious neuro-muscular anomalies detected. Sensory (Neurological): Unimpaired Muscle strength & Tone: No palpable anomalies  Lumbar Spine Area Exam  Skin & Axial Inspection: No masses, redness, or swelling Alignment: Symmetrical Functional ROM: Unrestricted ROM       Stability: No instability detected Muscle Tone/Strength: Functionally intact. No  obvious neuro-muscular anomalies detected. Sensory (Neurological): Unimpaired Palpation: No palpable anomalies       Provocative Tests: Hyperextension/rotation test: deferred today       Lumbar quadrant test (Kemp's test): deferred today       Lateral bending test: deferred today       Patrick's Maneuver: deferred today                   FABER* test: deferred today                   S-I anterior distraction/compression test: deferred today         S-I lateral compression test: deferred today         S-I Thigh-thrust test: deferred today         S-I Gaenslen's test: deferred today         *(Flexion, ABduction and External Rotation)  Gait & Posture Assessment  Ambulation: Unassisted Gait: Antalgic gait (limping) Posture: Difficulty standing up straight, due to pain   Lower Extremity Exam    Side: Right lower extremity  Side: Left lower extremity  Stability: No instability observed          Stability: No instability observed          Skin & Extremity Inspection: Skin color, temperature, and hair growth are WNL. No peripheral edema or cyanosis. No masses, redness, swelling, asymmetry, or associated skin lesions. No contractures.  Skin & Extremity Inspection: Skin color, temperature, and hair growth are WNL. No peripheral edema or cyanosis. No masses, redness, swelling, asymmetry, or associated skin lesions. No contractures.  Functional ROM: Unrestricted ROM                  Functional ROM: Unrestricted ROM                  Muscle Tone/Strength: Functionally intact. No obvious neuro-muscular anomalies detected.  Muscle Tone/Strength: Functionally intact. No obvious neuro-muscular anomalies detected.  Sensory (Neurological): Neuropathic pain pattern        Sensory (Neurological): Neuropathic pain pattern        DTR: Patellar: deferred today Achilles: deferred today Plantar: deferred today  DTR: Patellar: deferred today Achilles: deferred today Plantar: deferred today  Palpation: No  palpable anomalies  Palpation: No palpable anomalies   Assessment  Primary Diagnosis & Pertinent Problem List: The primary encounter diagnosis was Chronic radicular lumbar pain. Diagnoses of Chronic pain syndrome, Sensory polyneuropathy, and Paresthesia of both feet were also pertinent to this visit.  Visit Diagnosis (New problems to examiner): 1. Chronic radicular lumbar pain   2. Chronic pain syndrome   3. Sensory polyneuropathy   4. Paresthesia of both feet   General Recommendations: The pain condition that the patient suffers from is  best treated with a multidisciplinary approach that involves an increase in physical activity to prevent de-conditioning and worsening of the pain cycle, as well as psychological counseling (formal and/or informal) to address the co-morbid psychological affects of pain. Treatment will often involve judicious use of pain medications and interventional procedures to decrease the pain, allowing the patient to participate in the physical activity that will ultimately produce long-lasting pain reductions. The goal of the multidisciplinary approach is to return the patient to a higher level of overall function and to restore their ability to perform activities of daily living.  I had extensive discussion with the patient regarding her pain management options.  Patient's lower extremity pain could be related to her sensory polyneuropathy diagnosed on EMG.  Of note patient does have pathology at L4-L5.  She is status post transforaminal L4-L5 ESI which were not effective.  We discussed repeating the injection utilizing interlaminar approach and trying to add a greater volume of injectate, anywhere from 6 to 8 cc to see if this has a better impact on her leg pain.  We also had extensive discussion about lumbar spinal cord stimulation.  I explained in detail what a spinal cord stimulator trial consists of and what a permanent implant would entail.  After learning that a spinal  cord stimulator would need to be implanted and would require surgery, patient became less interested and wants to avoid this route.  Regards to medication management, I will increase her amitriptyline to 50 mg nightly.  I will have her continue her gabapentin as prescribed.  We will also have her discontinue Mobic and start Celebrex.  We will focus on nonopioid-based pain management  Plan of Care (Initial workup plan)  Note: Kara West was reminded that as per protocol, today's visit has been an evaluation only. We have not taken over the patient's controlled substance management.   1.  Interlaminar L4-L5 ESI without sedation 2.  Continue gabapentin 600 mg 3 times a day. 3.  Increase amitriptyline to 50 mg nightly 4.  Discontinue Mobic.  Start Celebrex 100 mg twice daily for the first month and then change to as needed    Procedure Orders     Lumbar Epidural Injection Pharmacotherapy (current): Medications ordered:  Meds ordered this encounter  Medications  . gabapentin (NEURONTIN) 600 MG tablet    Sig: Take 1 tablet (600 mg total) by mouth 3 (three) times daily.    Dispense:  90 tablet    Refill:  2  . amitriptyline (ELAVIL) 50 MG tablet    Sig: Take 1 tablet (50 mg total) by mouth at bedtime.    Dispense:  30 tablet    Refill:  2  . celecoxib (CELEBREX) 100 MG capsule    Sig: Take 1 capsule (100 mg total) by mouth 2 (two) times daily.    Dispense:  60 capsule    Refill:  2   Medications administered during this visit: Allie Dimmer had no medications administered during this visit.   Pharmacological management options:  Opioid Analgesics: The patient was informed that there is no guarantee that she would be a candidate for opioid analgesics. The decision will be made following CDC guidelines. This decision will be based on the results of diagnostic studies, as well as Kara West's risk profile.   Membrane stabilizer: Continue with gabapentin.  Has tried and failed  Lyrica in the past.  Muscle relaxant: To be determined at a later time  NSAID: Stop Mobic.  Start Celebrex  Other analgesic(s): To be determined at a later time   Interventional management options: Kara West was informed that there is no guarantee that she would be a candidate for interventional therapies. The decision will be based on the results of diagnostic studies, as well as Kara West risk profile.  Procedure(s) under consideration:  Interlaminar L4-L5 ESI   Provider-requested follow-up: Return in about 2 weeks (around 04/30/2019) for Procedure RIGHT L4-5 ESI , without sedation.  Future Appointments  Date Time Provider [place]  05/01/2019  8:45 AM Gillis Santa, MD [facility] None  07/09/2019  1:45 PM Gillis Santa, MD [facility] None    Primary Care Physician: Donnie Coffin, MD Location: [facility] Note by: Gillis Santa, MD Date: 04/16/2019; Time: 2:26 PM  Note: This dictation was prepared with Dragon dictation. Any transcriptional errors that may result from this process are unintentional.

## 2019-04-16 ENCOUNTER — Ambulatory Visit
Payer: BC Managed Care – PPO | Attending: Student in an Organized Health Care Education/Training Program | Admitting: Student in an Organized Health Care Education/Training Program

## 2019-04-16 ENCOUNTER — Encounter: Payer: Self-pay | Admitting: Student in an Organized Health Care Education/Training Program

## 2019-04-16 ENCOUNTER — Other Ambulatory Visit: Payer: Self-pay

## 2019-04-16 VITALS — BP 110/71 | HR 81 | Temp 98.1°F | Resp 18 | Ht 69.0 in | Wt 225.0 lb

## 2019-04-16 DIAGNOSIS — R202 Paresthesia of skin: Secondary | ICD-10-CM | POA: Diagnosis not present

## 2019-04-16 DIAGNOSIS — G8929 Other chronic pain: Secondary | ICD-10-CM | POA: Diagnosis not present

## 2019-04-16 DIAGNOSIS — M5416 Radiculopathy, lumbar region: Secondary | ICD-10-CM

## 2019-04-16 DIAGNOSIS — G894 Chronic pain syndrome: Secondary | ICD-10-CM | POA: Diagnosis not present

## 2019-04-16 DIAGNOSIS — G608 Other hereditary and idiopathic neuropathies: Secondary | ICD-10-CM | POA: Diagnosis not present

## 2019-04-16 MED ORDER — CELECOXIB 100 MG PO CAPS: 100.0000 mg | ORAL_CAPSULE | Freq: Two times a day (BID) | ORAL | 2 refills | Status: AC

## 2019-04-16 MED ORDER — GABAPENTIN 600 MG PO TABS: 600.0000 mg | ORAL_TABLET | Freq: Three times a day (TID) | ORAL | 2 refills | Status: DC

## 2019-04-16 MED ORDER — AMITRIPTYLINE HCL 50 MG PO TABS: 50.0000 mg | ORAL_TABLET | Freq: Every day | ORAL | 2 refills | Status: DC

## 2019-04-16 NOTE — Patient Instructions (Addendum)
____________________________________________________________________________________________  Preparing for your procedure (without sedation)  Procedure appointments are limited to planned procedures: . No Prescription Refills. . No disability issues will be discussed. . No medication changes will be discussed.  Instructions: . Oral Intake: Do not eat or drink anything for at least 3 hours prior to your procedure. . Transportation: Unless otherwise stated by your physician, you may drive yourself after the procedure. . Blood Pressure Medicine: Take your blood pressure medicine with a sip of water the morning of the procedure. . Blood thinners: Notify our staff if you are taking any blood thinners. Depending on which one you take, there will be specific instructions on how and when to stop it. . Diabetics on insulin: Notify the staff so that you can be scheduled 1st case in the morning. If your diabetes requires high dose insulin, take only  of your normal insulin dose the morning of the procedure and notify the staff that you have done so. . Preventing infections: Shower with an antibacterial soap the morning of your procedure.  . Build-up your immune system: Take 1000 mg of Vitamin C with every meal (3 times a day) the day prior to your procedure. . Antibiotics: Inform the staff if you have a condition or reason that requires you to take antibiotics before dental procedures. . Pregnancy: If you are pregnant, call and cancel the procedure. . Sickness: If you have a cold, fever, or any active infections, call and cancel the procedure. . Arrival: You must be in the facility at least 30 minutes prior to your scheduled procedure. . Children: Do not bring any children with you. . Dress appropriately: Bring dark clothing that you would not mind if they get stained. . Valuables: Do not bring any jewelry or valuables.  Reasons to call and reschedule or cancel your procedure: (Following these  recommendations will minimize the risk of a serious complication.) . Surgeries: Avoid having procedures within 2 weeks of any surgery. (Avoid for 2 weeks before or after any surgery). . Flu Shots: Avoid having procedures within 2 weeks of a flu shots or . (Avoid for 2 weeks before or after immunizations). . Barium: Avoid having a procedure within 7-10 days after having had a radiological study involving the use of radiological contrast. (Myelograms, Barium swallow or enema study). . Heart attacks: Avoid any elective procedures or surgeries for the initial 6 months after a "Myocardial Infarction" (Heart Attack). . Blood thinners: It is imperative that you stop these medications before procedures. Let us know if you if you take any blood thinner.  . Infection: Avoid procedures during or within two weeks of an infection (including chest colds or gastrointestinal problems). Symptoms associated with infections include: Localized redness, fever, chills, night sweats or profuse sweating, burning sensation when voiding, cough, congestion, stuffiness, runny nose, sore throat, diarrhea, nausea, vomiting, cold or Flu symptoms, recent or current infections. It is specially important if the infection is over the area that we intend to treat. . Heart and lung problems: Symptoms that may suggest an active cardiopulmonary problem include: cough, chest pain, breathing difficulties or shortness of breath, dizziness, ankle swelling, uncontrolled high or unusually low blood pressure, and/or palpitations. If you are experiencing any of these symptoms, cancel your procedure and contact your primary care physician for an evaluation.  Remember:  Regular Business hours are:  Monday to Thursday 8:00 AM to 4:00 PM  Provider's Schedule: Francisco Naveira, MD:  Procedure days: Tuesday and Thursday 7:30 AM to 4:00 PM  [place]   Holley Raring, MD:  Procedure days: Monday and Wednesday 7:30 AM to 4:00  PM ____________________________________________________________________________________________  Prescriptions for Amitriptyline, Gabapentin, and celebrex were sent to your pharmacy.

## 2019-05-01 ENCOUNTER — Ambulatory Visit
Admission: RE | Admit: 2019-05-01 | Discharge: 2019-05-01 | Disposition: A | Discharge status: Home / Self Care | Payer: BC Managed Care – PPO | Source: Ambulatory Visit | Attending: Student in an Organized Health Care Education/Training Program | Admitting: Student in an Organized Health Care Education/Training Program

## 2019-05-01 ENCOUNTER — Encounter: Payer: Self-pay | Admitting: Student in an Organized Health Care Education/Training Program

## 2019-05-01 ENCOUNTER — Ambulatory Visit (HOSPITAL_BASED_OUTPATIENT_CLINIC_OR_DEPARTMENT_OTHER): Payer: BC Managed Care – PPO | Admitting: Student in an Organized Health Care Education/Training Program

## 2019-05-01 ENCOUNTER — Other Ambulatory Visit: Payer: Self-pay

## 2019-05-01 VITALS — BP 106/71 | HR 80 | Temp 97.5°F | Resp 16 | Ht 69.0 in | Wt 225.0 lb

## 2019-05-01 DIAGNOSIS — G8929 Other chronic pain: Secondary | ICD-10-CM

## 2019-05-01 DIAGNOSIS — M5416 Radiculopathy, lumbar region: Secondary | ICD-10-CM | POA: Diagnosis not present

## 2019-05-01 MED ORDER — DEXAMETHASONE SODIUM PHOSPHATE 10 MG/ML IJ SOLN
INTRAMUSCULAR | Status: AC
  Filled 2019-05-01: qty 1

## 2019-05-01 MED ORDER — SODIUM CHLORIDE 0.9% FLUSH
2.0000 mL | Freq: Once | INTRAVENOUS | Status: AC
  Administered 2019-05-01: 2 mL

## 2019-05-01 MED ORDER — LIDOCAINE HCL 2 % IJ SOLN
20.0000 mL | Freq: Once | INTRAMUSCULAR | Status: AC
  Administered 2019-05-01: 400 mg

## 2019-05-01 MED ORDER — ROPIVACAINE HCL 2 MG/ML IJ SOLN
2.0000 mL | Freq: Once | INTRAMUSCULAR | Status: AC
  Administered 2019-05-01: 2 mL via EPIDURAL

## 2019-05-01 MED ORDER — IOHEXOL 180 MG/ML  SOLN
INTRAMUSCULAR | Status: AC
  Filled 2019-05-01: qty 20

## 2019-05-01 MED ORDER — SODIUM CHLORIDE (PF) 0.9 % IJ SOLN
INTRAMUSCULAR | Status: AC
  Filled 2019-05-01: qty 10

## 2019-05-01 MED ORDER — DEXAMETHASONE SODIUM PHOSPHATE 10 MG/ML IJ SOLN
10.0000 mg | Freq: Once | INTRAMUSCULAR | Status: AC
  Administered 2019-05-01: 10 mg

## 2019-05-01 MED ORDER — LIDOCAINE HCL 2 % IJ SOLN
INTRAMUSCULAR | Status: AC
  Filled 2019-05-01: qty 20

## 2019-05-01 MED ORDER — ROPIVACAINE HCL 2 MG/ML IJ SOLN
INTRAMUSCULAR | Status: AC
  Filled 2019-05-01: qty 10

## 2019-05-01 MED ORDER — IOHEXOL 180 MG/ML  SOLN
10.0000 mL | Freq: Once | INTRAMUSCULAR | Status: AC
  Administered 2019-05-01: 10 mL via EPIDURAL

## 2019-05-01 NOTE — Progress Notes (Signed)
Patient's Name: Kara West  MRN: [id number]  Referring Provider: Donnie Coffin, MD  DOB: 1975-08-26  PCP: Donnie Coffin, MD  DOS: 05/01/2019  Note by: Gillis Santa, MD  Service setting: Ambulatory outpatient  Specialty: Interventional Pain Management  Patient type: Established  Location: [facility] (AMB) [facility] Facility  Visit type: Interventional Procedure   Primary Reason for Visit: Interventional Pain Management Treatment. CC: Foot Pain  Procedure:          Anesthesia, Analgesia, Anxiolysis:  Type: Diagnostic Inter-Laminar Epidural Steroid Injection  #1  Region: Lumbar Level: L4-5 Level. Laterality: Right-Sided         Type: Local Anesthesia  Local Anesthetic: Lidocaine 1-2%  Position: Prone with head of the table was raised to facilitate breathing.   Indications: 1. Chronic radicular lumbar pain    Pain Score: Pre-procedure: 10-Worst pain ever/10 Post-procedure  5/10   Pre-op Assessment:  Kara West is a 43 y.o. (year old), female patient, seen today for interventional treatment. She  has a past surgical history that includes Cesarean section; Endometrial ablation (11/11/2011); Dilation and curettage of uterus (11/11/2011); and Tubal ligation (1997). Kara West has a current medication list which includes the following prescription(s): albuterol, amitriptyline, celecoxib, diclofenac, duloxetine, ferrous sulfate, ajovy, gabapentin, rizatriptan, aerochamber mv, thiamine, and topiramate. Her primarily concern today is the Foot Pain  Initial Vital Signs:  Pulse/HCG Rate: 93  Temp: (!) 97.5 F (36.4 C) Resp: 12 BP: 109/76 SpO2: 100 %  BMI: Estimated body mass index is 33.23 kg/m as calculated from the following:   Height as of this encounter: 5\' 9"  (1.753 m).   Weight as of this encounter: 225 lb (102.1 kg).  Risk Assessment: Allergies: Reviewed. She has No Known Allergies.  Allergy Precautions: None required Coagulopathies: Reviewed. None identified.   Blood-thinner therapy: None at this time Active Infection(s): Reviewed. None identified. Kara West is afebrile  Site Confirmation: Kara West was asked to confirm the procedure and laterality before marking the site Procedure checklist: Completed Consent: Before the procedure and under the influence of no sedative(s), amnesic(s), or anxiolytics, the patient was informed of the treatment options, risks and possible complications. To fulfill our ethical and legal obligations, as recommended by the American Medical Association's Code of Ethics, I have informed the patient of my clinical impression; the nature and purpose of the treatment or procedure; the risks, benefits, and possible complications of the intervention; the alternatives, including doing nothing; the risk(s) and benefit(s) of the alternative treatment(s) or procedure(s); and the risk(s) and benefit(s) of doing nothing. The patient was provided information about the general risks and possible complications associated with the procedure. These may include, but are not limited to: failure to achieve desired goals, infection, bleeding, organ or nerve damage, allergic reactions, paralysis, and death. In addition, the patient was informed of those risks and complications associated to Spine-related procedures, such as failure to decrease pain; infection (i.e.: Meningitis, epidural or intraspinal abscess); bleeding (i.e.: epidural hematoma, subarachnoid hemorrhage, or any other type of intraspinal or peri-dural bleeding); organ or nerve damage (i.e.: Any type of peripheral nerve, nerve root, or spinal cord injury) with subsequent damage to sensory, motor, and/or autonomic systems, resulting in permanent pain, numbness, and/or weakness of one or several areas of the body; allergic reactions; (i.e.: anaphylactic reaction); and/or death. Furthermore, the patient was informed of those risks and complications associated with the medications. These  include, but are not limited to: allergic reactions (i.e.: anaphylactic or anaphylactoid reaction(s)); adrenal axis suppression;  blood sugar elevation that in diabetics may result in ketoacidosis or comma; water retention that in patients with history of congestive heart failure may result in shortness of breath, pulmonary edema, and decompensation with resultant heart failure; weight gain; swelling or edema; medication-induced neural toxicity; particulate matter embolism and blood vessel occlusion with resultant organ, and/or nervous system infarction; and/or aseptic necrosis of one or more joints. Finally, the patient was informed that Medicine is not an exact science; therefore, there is also the possibility of unforeseen or unpredictable risks and/or possible complications that may result in a catastrophic outcome. The patient indicated having understood very clearly. We have given the patient no guarantees and we have made no promises. Enough time was given to the patient to ask questions, all of which were answered to the patient's satisfaction. Kara West has indicated that she wanted to continue with the procedure. Attestation: I, the ordering provider, attest that I have discussed with the patient the benefits, risks, side-effects, alternatives, likelihood of achieving goals, and potential problems during recovery for the procedure that I have provided informed consent. Date  Time: 05/01/2019  8:32 AM  Pre-Procedure Preparation:  Monitoring: As per clinic protocol. Respiration, ETCO2, SpO2, BP, heart rate and rhythm monitor placed and checked for adequate function Safety Precautions: Patient was assessed for positional comfort and pressure points before starting the procedure. Time-out: I initiated and conducted the "Time-out" before starting the procedure, as per protocol. The patient was asked to participate by confirming the accuracy of the "Time Out" information. Verification of the correct  person, site, and procedure were performed and confirmed by me, the nursing staff, and the patient. "Time-out" conducted as per Joint Commission's Universal Protocol (UP.01.01.01). Time: 0928  Description of Procedure:          Target Area: The interlaminar space, initially targeting the lower laminar border of the superior vertebral body. Approach: Paramedial approach. Area Prepped: Entire Posterior Lumbar Region Prepping solution: DuraPrep (Iodine Povacrylex [0.7% available iodine] and Isopropyl Alcohol, 74% w/w) Safety Precautions: Aspiration looking for blood return was conducted prior to all injections. At no point did we inject any substances, as a needle was being advanced. No attempts were made at seeking any paresthesias. Safe injection practices and needle disposal techniques used. Medications properly checked for expiration dates. SDV (single dose vial) medications used. Description of the Procedure: Protocol guidelines were followed. The procedure needle was introduced through the skin, ipsilateral to the reported pain, and advanced to the target area. Bone was contacted and the needle walked caudad, until the lamina was cleared. The epidural space was identified using "loss-of-resistance technique" with 2-3 ml of PF-NaCl (0.9% NSS), in a 5cc LOR glass syringe.  Vitals:   05/01/19 0838 05/01/19 0928 05/01/19 0934 05/01/19 0937  BP: 109/76 113/77 104/76 106/71  Pulse: 93 91 86 80  Resp:  12 14 16   Temp: (!) 97.5 F (36.4 C)     SpO2: 100% 100% 100% 100%  Weight: 225 lb (102.1 kg)     Height: 5\' 9"  (1.753 m)       Start Time: 0928 hrs. End Time: 0936 hrs.  Materials:  Needle(s) Type: Epidural needle Gauge: 17G Length: 5-in Medication(s): Please see orders for medications and dosing details.  8 cc solution made of 5 cc of preservative-free saline, 2 cc of 0.2% ropivacaine, 1 cc of Decadron 10 mg/cc.  Imaging Guidance (Spinal):          Type of Imaging Technique:  Fluoroscopy Guidance (Spinal) Indication(s):  Assistance in needle guidance and placement for procedures requiring needle placement in or near specific anatomical locations not easily accessible without such assistance. Exposure Time: Please see nurses notes. Contrast: Before injecting any contrast, we confirmed that the patient did not have an allergy to iodine, shellfish, or radiological contrast. Once satisfactory needle placement was completed at the desired level, radiological contrast was injected. Contrast injected under live fluoroscopy. No contrast complications. See chart for type and volume of contrast used. Fluoroscopic Guidance: I was personally present during the use of fluoroscopy. "Tunnel Vision Technique" used to obtain the best possible view of the target area. Parallax error corrected before commencing the procedure. "Direction-depth-direction" technique used to introduce the needle under continuous pulsed fluoroscopy. Once target was reached, antero-posterior, oblique, and lateral fluoroscopic projection used confirm needle placement in all planes. Images permanently stored in EMR. Interpretation: I personally interpreted the imaging intraoperatively. Adequate needle placement confirmed in multiple planes. Appropriate spread of contrast into desired area was observed. No evidence of afferent or efferent intravascular uptake. No intrathecal or subarachnoid spread observed. Permanent images saved into the patient's record.  Antibiotic Prophylaxis:   Anti-infectives (From admission, onward)   None     Indication(s): None identified  Post-operative Assessment:  Post-procedure Vital Signs:  Pulse/HCG Rate: 80  Temp: (!) 97.5 F (36.4 C) Resp: 16 BP: 106/71 SpO2: 100 %  EBL: None  Complications: No immediate post-treatment complications observed by team, or reported by patient.  Note: The patient tolerated the entire procedure well. A repeat set of vitals were taken after the  procedure and the patient was kept under observation following institutional policy, for this type of procedure. Post-procedural neurological assessment was performed, showing return to baseline, prior to discharge. The patient was provided with post-procedure discharge instructions, including a section on how to identify potential problems. Should any problems arise concerning this procedure, the patient was given instructions to immediately contact us, at any time, without hesitation. In any case, we plan to contact the patient by telephone for a follow-up status report regarding this interventional procedure.  Comments:  No additional relevant information. 5 out of 5 strength bilateral lower extremity: Plantar flexion, dorsiflexion, knee flexion, knee extension.  Plan of Care  Orders:  Orders Placed This Encounter  Procedures  . Fluoro (C-Arm) (<60 min) (No Report)    Intraoperative interpretation by procedural physician at [place].    Standing Status:   Standing    Number of Occurrences:   1    Order Specific Question:   Reason for exam:    Answer:   Assistance in needle guidance and placement for procedures requiring needle placement in or near specific anatomical locations not easily accessible without such assistance.    Medications ordered for procedure: Meds ordered this encounter  Medications  . iohexol (OMNIPAQUE) 180 MG/ML injection 10 mL    Must be Myelogram-compatible. If not available, you may substitute with a water-soluble, non-ionic, hypoallergenic, myelogram-compatible radiological contrast medium.  Marland Kitchen lidocaine (XYLOCAINE) 2 % (with pres) injection 400 mg  . sodium chloride flush (NS) 0.9 % injection 2 mL  . ropivacaine (PF) 2 mg/mL (0.2%) (NAROPIN) injection 2 mL  . dexamethasone (DECADRON) injection 10 mg   Medications administered: We administered iohexol, lidocaine, sodium chloride flush, ropivacaine (PF) 2 mg/mL (0.2%), and dexamethasone.  See the  medical record for exact dosing, route, and time of administration.  Follow-up plan:   Return in about 4 weeks (around 05/29/2019) for Post Procedure Evaluation, virtual.  Status post right L4/5 ESI 05/01/2019: 8 cc injected   Recent Visits Date Type Provider Dept  04/16/19 Office Visit Gillis Santa, MD [facility]  Showing recent visits within past 90 days and meeting all other requirements   Today's Visits Date Type Provider Dept  05/01/19 Procedure visit Gillis Santa, MD [facility]  Showing today's visits and meeting all other requirements   Future Appointments Date Type Provider Dept  06/04/19 Appointment Gillis Santa, MD [facility]  07/09/19 Appointment Gillis Santa, MD [facility]  Showing future appointments within next 90 days and meeting all other requirements   Disposition: Discharge home  Discharge Date & Time: 05/01/2019; 0945 hrs.   Primary Care Physician: Donnie Coffin, MD Location: [facility] Note by: Gillis Santa, MD Date: 05/01/2019; Time: 12:29 PM  Disclaimer:  Medicine is not an exact science. The only guarantee in medicine is that nothing is guaranteed. It is important to note that the decision to proceed with this intervention was based on the information collected from the patient. The Data and conclusions were drawn from the patient's questionnaire, the interview, and the physical examination. Because the information was provided in large part by the patient, it cannot be guaranteed that it has not been purposely or unconsciously manipulated. Every effort has been made to obtain as much relevant data as possible for this evaluation. It is important to note that the conclusions that lead to this procedure are derived in large part from the available data. Always take into account that the treatment will also be dependent on availability of resources and existing treatment guidelines,  considered by other Pain Management Practitioners as being common knowledge and practice, at the time of the intervention. For Medico-Legal purposes, it is also important to point out that variation in procedural techniques and pharmacological choices are the acceptable norm. The indications, contraindications, technique, and results of the above procedure should only be interpreted and judged by a Board-Certified Interventional Pain Specialist with extensive familiarity and expertise in the same exact procedure and technique.

## 2019-05-01 NOTE — Patient Instructions (Signed)
Pain Management Discharge Instructions  General Discharge Instructions :  If you need to reach your doctor call: Monday-Friday 8:00 am - 4:00 pm at [phone] or toll free [phone].  After clinic hours [phone] to have operator reach doctor.  Bring all of your medication bottles to all your appointments in the pain clinic.  To cancel or reschedule your appointment with Pain Management please remember to call 24 hours in advance to avoid a fee.  Refer to the educational materials which you have been given on: General Risks, I had my Procedure. Discharge Instructions, Post Sedation.  Post Procedure Instructions:  The drugs you were given will stay in your system until tomorrow, so for the next 24 hours you should not drive, make any legal decisions or drink any alcoholic beverages.  You may eat anything you prefer, but it is better to start with liquids then soups and crackers, and gradually work up to solid foods.  Please notify your doctor immediately if you have any unusual bleeding, trouble breathing or pain that is not related to your normal pain.  Depending on the type of procedure that was done, some parts of your body may feel week and/or numb.  This usually clears up by tonight or the next day.  Walk with the use of an assistive device or accompanied by an adult for the 24 hours.  You may use ice on the affected area for the first 24 hours.  Put ice in a Ziploc bag and cover with a towel and place against area 15 minutes on 15 minutes off.  You may switch to heat after 24 hours.Epidural Steroid Injection Patient Information  Description: The epidural space surrounds the nerves as they exit the spinal cord.  In some patients, the nerves can be compressed and inflamed by a bulging disc or a tight spinal canal (spinal stenosis).  By injecting steroids into the epidural space, we can bring irritated nerves into direct contact with a potentially helpful medication.  These  steroids act directly on the irritated nerves and can reduce swelling and inflammation which often leads to decreased pain.  Epidural steroids may be injected anywhere along the spine and from the neck to the low back depending upon the location of your pain.   After numbing the skin with local anesthetic (like Novocaine), a small needle is passed into the epidural space slowly.  You may experience a sensation of pressure while this is being done.  The entire block usually last less than 10 minutes.  Conditions which may be treated by epidural steroids:   Low back and leg pain  Neck and arm pain  Spinal stenosis  Post-laminectomy syndrome  Herpes zoster (shingles) pain  Pain from compression fractures  Preparation for the injection:  1. Do not eat any solid food or dairy products within 8 hours of your appointment.  2. You may drink clear liquids up to 3 hours before appointment.  Clear liquids include water, black coffee, juice or soda.  No milk or cream please. 3. You may take your regular medication, including pain medications, with a sip of water before your appointment  Diabetics should hold regular insulin (if taken separately) and take 1/2 normal NPH dos the morning of the procedure.  Carry some sugar containing items with you to your appointment. 4. A driver must accompany you and be prepared to drive you home after your procedure.  5. Bring all your current medications with your. 6. An IV may be inserted and   sedation may be given at the discretion of the physician.   7. A blood pressure cuff, EKG and other monitors will often be applied during the procedure.  Some patients may need to have extra oxygen administered for a short period. 8. You will be asked to provide medical information, including your allergies, prior to the procedure.  We must know immediately if you are taking blood thinners (like Coumadin/Warfarin)  Or if you are allergic to IV iodine contrast (dye). We must  know if you could possible be pregnant.  Possible side-effects:  Bleeding from needle site  Infection (rare, may require surgery)  Nerve injury (rare)  Numbness & tingling (temporary)  Difficulty urinating (rare, temporary)  Spinal headache ( a headache worse with upright posture)  Light -headedness (temporary)  Pain at injection site (several days)  Decreased blood pressure (temporary)  Weakness in arm/leg (temporary)  Pressure sensation in back/neck (temporary)  Call if you experience:  Fever/chills associated with headache or increased back/neck pain.  Headache worsened by an upright position.  New onset weakness or numbness of an extremity below the injection site  Hives or difficulty breathing (go to the emergency room)  Inflammation or drainage at the infection site  Severe back/neck pain  Any new symptoms which are concerning to you  Please note:  Although the local anesthetic injected can often make your back or neck feel good for several hours after the injection, the pain will likely return.  It takes 3-7 days for steroids to work in the epidural space.  You may not notice any pain relief for at least that one week.  If effective, we will often do a series of three injections spaced 3-6 weeks apart to maximally decrease your pain.  After the initial series, we generally will wait several months before considering a repeat injection of the same type.  If you have any questions, please call [phone] [facility] 

## 2019-05-02 ENCOUNTER — Telehealth: Payer: Self-pay

## 2019-05-02 NOTE — Telephone Encounter (Signed)
Post procedure phone call.  Patient states she is doing well.  

## 2019-06-03 ENCOUNTER — Ambulatory Visit
Payer: BC Managed Care – PPO | Attending: Student in an Organized Health Care Education/Training Program | Admitting: Student in an Organized Health Care Education/Training Program

## 2019-06-03 ENCOUNTER — Encounter: Payer: Self-pay | Admitting: Student in an Organized Health Care Education/Training Program

## 2019-06-03 ENCOUNTER — Other Ambulatory Visit: Payer: Self-pay

## 2019-06-03 ENCOUNTER — Telehealth: Payer: Self-pay | Admitting: *Deleted

## 2019-06-03 DIAGNOSIS — G894 Chronic pain syndrome: Secondary | ICD-10-CM | POA: Diagnosis not present

## 2019-06-03 DIAGNOSIS — R202 Paresthesia of skin: Secondary | ICD-10-CM | POA: Diagnosis not present

## 2019-06-03 DIAGNOSIS — M5416 Radiculopathy, lumbar region: Secondary | ICD-10-CM | POA: Diagnosis not present

## 2019-06-03 DIAGNOSIS — G608 Other hereditary and idiopathic neuropathies: Secondary | ICD-10-CM

## 2019-06-03 DIAGNOSIS — G8929 Other chronic pain: Secondary | ICD-10-CM

## 2019-06-03 NOTE — Progress Notes (Addendum)
Pain Management Virtual Encounter Note - Virtual Visit via [facility] (real-time audio visits between healthcare provider and patient).   Patient's Phone No. & Preferred Pharmacy:  [phone] (home); [phone] (mobile); (Preferred) [phone] [email or web address]  [place] [address] [place] [address]) [place] [address] Phone: [phone] Fax: [phone]    Pre-screening note:  Our staff contacted Kara West and offered her an "in person", "face-to-face" appointment versus a telephone encounter. She indicated preferring the telephone encounter, at this time.   Reason for Virtual Visit: COVID-19*  Social distancing based on CDC and AMA recommendations.   I contacted Kara West on 06/03/2019 via video conference.      I clearly identified myself as Gillis Santa, MD. I verified that I was speaking with the correct person using two identifiers (Name: Kara West, and date of birth: Nov 17, 1975).  Advanced Informed Consent I sought verbal advanced consent from Kara West for virtual visit interactions. I informed Kara West of possible security and privacy concerns, risks, and limitations associated with providing "not-in-person" medical evaluation and management services. I also informed Kara West of the availability of "in-person" appointments. Finally, I informed her that there would be a charge for the virtual visit and that she could be  personally, fully or partially, financially responsible for it. Kara West expressed understanding and agreed to proceed.   Historic Elements   Kara West is a 43 y.o. year old, female patient evaluated today after her last encounter by our practice on 06/03/2019. Kara West  has a past medical history of Anemia, Bronchitis, COPD (chronic obstructive pulmonary disease) ([place]), GERD (gastroesophageal  reflux disease), and History of Papanicolaou smear of cervix (10/12/2011). She also  has a past surgical history that includes Cesarean section; Endometrial ablation (11/11/2011); Dilation and curettage of uterus (11/11/2011); and Tubal ligation (1997). Kara West has a current medication list which includes the following prescription(s): albuterol, amitriptyline, celecoxib, diclofenac, duloxetine, ferrous sulfate, ajovy, gabapentin, rizatriptan, aerochamber mv, thiamine, and topiramate. She  reports that she quit smoking about 4 years ago. Her smoking use included cigarettes. She has a 1.00 pack-year smoking history. She has never used smokeless tobacco. She reports current alcohol use. She reports that she does not use drugs. Kara West has No Known Allergies.   HPI  Today, she is being contacted for a post-procedure assessment.   Evaluation of last interventional procedure  05/01/2019 Procedure:  Type: Diagnostic Inter-Laminar Epidural Steroid Injection  #1  Region: Lumbar Level: L4-5 Level. Laterality: Right-Sided   Post-procedural adverse reactions or complications: None reported         Sedation: Please see nurses note for DOS. When no sedatives are used, the analgesic levels obtained are directly associated to the effectiveness of the local anesthetics. However, when sedation is provided, the level of analgesia obtained during the initial 1 hour following the intervention, is believed to be the result of a combination of factors. These factors may include, but are not limited to: 1. The effectiveness of the local anesthetics used. 2. The effects of the analgesic(s) and/or anxiolytic(s) used. 3. The degree of discomfort experienced by the patient at the time of the procedure. 4. The patients ability and reliability in recalling and recording the events. 5. The presence and influence of possible secondary gains and/or psychosocial factors. Reported result: Relief experienced during the  1st hour after the procedure: 100%   (Ultra-Short Term Relief)  Interpretative annotation: Clinically appropriate result. Analgesia during this period is likely to be Local Anesthetic and/or IV Sedative (Analgesic/Anxiolytic) related.          Effects of local anesthetic: The analgesic effects attained during this period are directly associated to the localized infiltration of local anesthetics and therefore cary significant diagnostic value as to the etiological location, or anatomical origin, of the pain. Expected duration of relief is directly dependent on the pharmacodynamics of the local anesthetic used. Long-acting (4-6 hours) anesthetics used.  Reported result: Relief during the next 4 to 6 hour after the procedure:100%   (Short-Term Relief)            Interpretative annotation: Clinically appropriate result. Analgesia during this period is likely to be Local Anesthetic-related.          Long-term benefit: Defined as the period of time past the expected duration of local anesthetics (1 hour for short-acting and 4-6 hours for long-acting). With the possible exception of prolonged sympathetic blockade from the local anesthetics, benefits during this period are typically attributed to, or associated with, other factors such as analgesic sensory neuropraxia, antiinflammatory effects, or beneficial biochemical changes provided by agents other than the local anesthetics.  Reported result: Extended relief following procedure:60-70%   (Long-Term Relief)            Interpretative annotation: Clinically appropriate result. Good relief. No permanent benefit expected. Inflammation plays a part in the etiology to the pain.          Laboratory Chemistry Profile (12 mo)  Renal: 08/31/2018: BUN 17; Creatinine, Ser 0.64  Lab Results  Component Value Date   GFRAA >60 08/31/2018   GFRNONAA >60 08/31/2018   Hepatic: No results found for requested labs within last 8760 hours. Lab Results  Component  Value Date   AST 17 11/19/2017   ALT 13 (L) 11/19/2017   Other: No results found for requested labs within last 8760 hours. Note: Above Lab results reviewed.  Imaging  Fluoro (C-Arm) (<60 min) (No Report) Fluoro was used, but no Radiologist interpretation will be provided.  Please refer to "NOTES" tab for provider progress note.   Assessment  The primary encounter diagnosis was Chronic radicular lumbar pain. Diagnoses of Chronic pain syndrome, Sensory polyneuropathy, and Paresthesia of both feet were also pertinent to this visit.  Plan of Care  I am having Kara West maintain her AeroChamber MV, albuterol, ferrous sulfate, thiamine, DULoxetine, Ajovy, rizatriptan, topiramate, diclofenac, gabapentin, amitriptyline, and celecoxib.  Virtual postprocedural evaluation status post right L4-L5 ESI #1.  She states that she is experiencing pain relief and improvement in her functional status since her epidural.  She states that this was more effective than her a transforaminal L4-L5 ESI.  She states that the intensity of her pain flares are less and that the duration of her pain flares are also less.  She does work 12 hours a day 2 to 4 days a week.  She states that the end of her shift, she does have right ankle pain which is likely related to her sensory polyneuropathy.  Given that her low back and radiating leg pain is responding to lumbar epidural steroid injection, recommend repeating.  Risks and benefits reviewed and patient would like to proceed.  Follow-up plan:   Return in about 23 days (around 06/26/2019) for RIGHT L4/5 ESI #2, without sedation.     Status post right L4/5 ESI 05/01/2019: 8 cc injected    Recent Visits Date Type Provider Dept  05/01/19 Procedure visit Gillis Santa, MD [facility]  04/16/19 Office Visit Gillis Santa, MD [facility]  Showing recent visits within past 90 days and meeting all other requirements   Today's Visits Date Type Provider  Dept  06/03/19 Office Visit Gillis Santa, MD [facility]  Showing today's visits and meeting all other requirements   Future Appointments Date Type Provider Dept  07/09/19 Appointment Gillis Santa, MD [facility]  Showing future appointments within next 90 days and meeting all other requirements   I discussed the assessment and treatment plan with the patient. The patient was provided an opportunity to ask questions and all were answered. The patient agreed with the plan and demonstrated an understanding of the instructions.  Patient advised to call back or seek an in-person evaluation if the symptoms or condition worsens.  Total duration of non-face-to-face encounter: 25 minutes.  Note by: Gillis Santa, MD Date: 06/03/2019; Time: 10:43 AM  Note: This dictation was prepared with Dragon dictation. Any transcriptional errors that may result from this process are unintentional.  Disclaimer:  * Given the special circumstances of the COVID-19 pandemic, the federal government has announced that the Office for Civil Rights (OCR) will exercise its enforcement discretion and will not impose penalties on physicians using telehealth in the event of noncompliance with regulatory requirements under the [place] and [place] (HIPAA) in connection with the good faith provision of telehealth during the XX123456 national public health emergency. ([place])

## 2019-06-03 NOTE — Telephone Encounter (Signed)
Attempted to call for pre appointment review of allergies/meds. Message left. 

## 2019-06-04 ENCOUNTER — Ambulatory Visit: Payer: BC Managed Care – PPO | Admitting: Student in an Organized Health Care Education/Training Program

## 2019-06-26 ENCOUNTER — Ambulatory Visit
Admission: RE | Admit: 2019-06-26 | Discharge: 2019-06-26 | Disposition: A | Discharge status: Home / Self Care | Payer: BC Managed Care – PPO | Source: Ambulatory Visit | Attending: Student in an Organized Health Care Education/Training Program | Admitting: Student in an Organized Health Care Education/Training Program

## 2019-06-26 ENCOUNTER — Ambulatory Visit (HOSPITAL_BASED_OUTPATIENT_CLINIC_OR_DEPARTMENT_OTHER): Payer: BC Managed Care – PPO | Admitting: Student in an Organized Health Care Education/Training Program

## 2019-06-26 ENCOUNTER — Encounter: Payer: Self-pay | Admitting: Student in an Organized Health Care Education/Training Program

## 2019-06-26 ENCOUNTER — Other Ambulatory Visit: Payer: Self-pay

## 2019-06-26 DIAGNOSIS — G8929 Other chronic pain: Secondary | ICD-10-CM | POA: Insufficient documentation

## 2019-06-26 DIAGNOSIS — M5416 Radiculopathy, lumbar region: Secondary | ICD-10-CM

## 2019-06-26 MED ORDER — ROPIVACAINE HCL 2 MG/ML IJ SOLN
2.0000 mL | Freq: Once | INTRAMUSCULAR | Status: AC
  Administered 2019-06-26: 2 mL via EPIDURAL

## 2019-06-26 MED ORDER — LIDOCAINE HCL 2 % IJ SOLN
INTRAMUSCULAR | Status: AC
  Filled 2019-06-26: qty 20

## 2019-06-26 MED ORDER — SODIUM CHLORIDE (PF) 0.9 % IJ SOLN
INTRAMUSCULAR | Status: AC
  Filled 2019-06-26: qty 10

## 2019-06-26 MED ORDER — SODIUM CHLORIDE 0.9% FLUSH
2.0000 mL | Freq: Once | INTRAVENOUS | Status: AC
  Administered 2019-06-26: 2 mL

## 2019-06-26 MED ORDER — DEXAMETHASONE SODIUM PHOSPHATE 10 MG/ML IJ SOLN
INTRAMUSCULAR | Status: AC
  Filled 2019-06-26: qty 1

## 2019-06-26 MED ORDER — ROPIVACAINE HCL 2 MG/ML IJ SOLN
INTRAMUSCULAR | Status: AC
  Filled 2019-06-26: qty 10

## 2019-06-26 MED ORDER — DEXAMETHASONE SODIUM PHOSPHATE 10 MG/ML IJ SOLN
10.0000 mg | Freq: Once | INTRAMUSCULAR | Status: AC
  Administered 2019-06-26: 10 mg

## 2019-06-26 MED ORDER — IOHEXOL 180 MG/ML  SOLN
INTRAMUSCULAR | Status: AC
  Filled 2019-06-26: qty 20

## 2019-06-26 MED ORDER — LIDOCAINE HCL 2 % IJ SOLN
20.0000 mL | Freq: Once | INTRAMUSCULAR | Status: AC
  Administered 2019-06-26: 400 mg

## 2019-06-26 MED ORDER — IOHEXOL 180 MG/ML  SOLN
10.0000 mL | Freq: Once | INTRAMUSCULAR | Status: AC
  Administered 2019-06-26: 10 mL via EPIDURAL

## 2019-06-26 NOTE — Patient Instructions (Signed)

## 2019-06-26 NOTE — Progress Notes (Signed)
Safety precautions to be maintained throughout the outpatient stay will include: orient to surroundings, keep bed in low position, maintain call bell within reach at all times, provide assistance with transfer out of bed and ambulation.  

## 2019-06-26 NOTE — Progress Notes (Signed)
Patient's Name: Kara West  MRN: [id number]  Referring Provider: Gillis Santa, MD  DOB: [birth year]  PCP: Donnie Coffin, MD  DOS: 06/26/2019  Note by: Gillis Santa, MD  Service setting: Ambulatory outpatient  Specialty: Interventional Pain Management  Patient type: Established  Location: [facility] (AMB) Pain Management Facility  Visit type: Interventional Procedure   Primary Reason for Visit: Interventional Pain Management Treatment. CC: Foot Pain (bilateral)  Procedure:          Anesthesia, Analgesia, Anxiolysis:  Type: Therapeutic Inter-Laminar Epidural Steroid Injection  #2  Region: Lumbar Level: L4-5 Level. Laterality: Right-Sided         Type: Local Anesthesia  Local Anesthetic: Lidocaine 1-2%  Position: Prone with head of the table was raised to facilitate breathing.   Indications: 1. Chronic radicular lumbar pain    Pain Score: Pre-procedure: 9 /10 Post-procedure 0 /10   Pre-op Assessment:  Kara West is a 44 y.o. (year old), female patient, seen today for interventional treatment. She  has a past surgical history that includes Cesarean section; Endometrial ablation (11/11/2011); Dilation and curettage of uterus (11/11/2011); and Tubal ligation (1997). Kara West has a current medication list which includes the following prescription(s): albuterol, amitriptyline, celecoxib, diclofenac, duloxetine, ferrous sulfate, ajovy, gabapentin, rizatriptan, aerochamber mv, thiamine, and topiramate. Her primarily concern today is the Foot Pain (bilateral)  Initial Vital Signs:  Pulse/HCG Rate: 100  Temp: 97.9 F (36.6 C) Resp: 16 BP: 117/84 SpO2: 100 %  BMI: Estimated body mass index is 31.75 kg/m as calculated from the following:   Height as of this encounter: 5\' 9"  (1.753 m).   Weight as of this encounter: 215 lb (97.5 kg).  Risk Assessment: Allergies: Reviewed. She has No Known Allergies.  Allergy Precautions: None required Coagulopathies: Reviewed. None identified.   Blood-thinner therapy: None at this time Active Infection(s): Reviewed. None identified. Kara West is afebrile  Site Confirmation: Kara West was asked to confirm the procedure and laterality before marking the site Procedure checklist: Completed Consent: Before the procedure and under the influence of no sedative(s), amnesic(s), or anxiolytics, the patient was informed of the treatment options, risks and possible complications. To fulfill our ethical and legal obligations, as recommended by the American Medical Association's Code of Ethics, I have informed the patient of my clinical impression; the nature and purpose of the treatment or procedure; the risks, benefits, and possible complications of the intervention; the alternatives, including doing nothing; the risk(s) and benefit(s) of the alternative treatment(s) or procedure(s); and the risk(s) and benefit(s) of doing nothing. The patient was provided information about the general risks and possible complications associated with the procedure. These may include, but are not limited to: failure to achieve desired goals, infection, bleeding, organ or nerve damage, allergic reactions, paralysis, and death. In addition, the patient was informed of those risks and complications associated to Spine-related procedures, such as failure to decrease pain; infection (i.e.: Meningitis, epidural or intraspinal abscess); bleeding (i.e.: epidural hematoma, subarachnoid hemorrhage, or any other type of intraspinal or peri-dural bleeding); organ or nerve damage (i.e.: Any type of peripheral nerve, nerve root, or spinal cord injury) with subsequent damage to sensory, motor, and/or autonomic systems, resulting in permanent pain, numbness, and/or weakness of one or several areas of the body; allergic reactions; (i.e.: anaphylactic reaction); and/or death. Furthermore, the patient was informed of those risks and complications associated with the medications. These  include, but are not limited to: allergic reactions (i.e.: anaphylactic or anaphylactoid reaction(s)); adrenal axis suppression; blood  sugar elevation that in diabetics may result in ketoacidosis or comma; water retention that in patients with history of congestive heart failure may result in shortness of breath, pulmonary edema, and decompensation with resultant heart failure; weight gain; swelling or edema; medication-induced neural toxicity; particulate matter embolism and blood vessel occlusion with resultant organ, and/or nervous system infarction; and/or aseptic necrosis of one or more joints. Finally, the patient was informed that Medicine is not an exact science; therefore, there is also the possibility of unforeseen or unpredictable risks and/or possible complications that may result in a catastrophic outcome. The patient indicated having understood very clearly. We have given the patient no guarantees and we have made no promises. Enough time was given to the patient to ask questions, all of which were answered to the patient's satisfaction. Kara West has indicated that she wanted to continue with the procedure. Attestation: I, the ordering provider, attest that I have discussed with the patient the benefits, risks, side-effects, alternatives, likelihood of achieving goals, and potential problems during recovery for the procedure that I have provided informed consent. Date  Time: 06/26/2019 11:04 AM  Pre-Procedure Preparation:  Monitoring: As per clinic protocol. Respiration, ETCO2, SpO2, BP, heart rate and rhythm monitor placed and checked for adequate function Safety Precautions: Patient was assessed for positional comfort and pressure points before starting the procedure. Time-out: I initiated and conducted the "Time-out" before starting the procedure, as per protocol. The patient was asked to participate by confirming the accuracy of the "Time Out" information. Verification of the correct  person, site, and procedure were performed and confirmed by me, the nursing staff, and the patient. "Time-out" conducted as per Joint Commission's Universal Protocol (UP.01.01.01). Time: 1150  Description of Procedure:          Target Area: The interlaminar space, initially targeting the lower laminar border of the superior vertebral body. Approach: Paramedial approach. Area Prepped: Entire Posterior Lumbar Region Prepping solution: DuraPrep (Iodine Povacrylex [0.7% available iodine] and Isopropyl Alcohol, 74% w/w) Safety Precautions: Aspiration looking for blood return was conducted prior to all injections. At no point did we inject any substances, as a needle was being advanced. No attempts were made at seeking any paresthesias. Safe injection practices and needle disposal techniques used. Medications properly checked for expiration dates. SDV (single dose vial) medications used. Description of the Procedure: Protocol guidelines were followed. The procedure needle was introduced through the skin, ipsilateral to the reported pain, and advanced to the target area. Bone was contacted and the needle walked caudad, until the lamina was cleared. The epidural space was identified using "loss-of-resistance technique" with 2-3 ml of PF-NaCl (0.9% NSS), in a 5cc LOR glass syringe.  Vitals:   06/26/19 1130 06/26/19 1140 06/26/19 1150 06/26/19 1200  BP: 112/85 114/64 111/70 119/70  Pulse: (!) 111 (!) 105 (!) 102 (!) 119  Resp: 17 17 19 20   Temp:      TempSrc:      SpO2: 99% 99% 99% 99%  Weight:      Height:        Start Time: 1150 hrs. End Time: 1157 hrs.  Materials:  Needle(s) Type: Epidural needle Gauge: 17G Length: 5-in Medication(s): Please see orders for medications and dosing details.  9 cc solution made of 5 cc of preservative-free saline, 3 cc of 0.2% ropivacaine, 1 cc of Decadron 10 mg/cc.  Imaging Guidance (Spinal):          Type of Imaging Technique: Fluoroscopy Guidance  (Spinal) Indication(s): Assistance in  needle guidance and placement for procedures requiring needle placement in or near specific anatomical locations not easily accessible without such assistance. Exposure Time: Please see nurses notes. Contrast: Before injecting any contrast, we confirmed that the patient did not have an allergy to iodine, shellfish, or radiological contrast. Once satisfactory needle placement was completed at the desired level, radiological contrast was injected. Contrast injected under live fluoroscopy. No contrast complications. See chart for type and volume of contrast used. Fluoroscopic Guidance: I was personally present during the use of fluoroscopy. "Tunnel Vision Technique" used to obtain the best possible view of the target area. Parallax error corrected before commencing the procedure. "Direction-depth-direction" technique used to introduce the needle under continuous pulsed fluoroscopy. Once target was reached, antero-posterior, oblique, and lateral fluoroscopic projection used confirm needle placement in all planes. Images permanently stored in EMR. Interpretation: I personally interpreted the imaging intraoperatively. Adequate needle placement confirmed in multiple planes. Appropriate spread of contrast into desired area was observed. No evidence of afferent or efferent intravascular uptake. No intrathecal or subarachnoid spread observed. Permanent images saved into the patient's record.  Antibiotic Prophylaxis:   Anti-infectives (From admission, onward)   None     Indication(s): None identified  Post-operative Assessment:  Post-procedure Vital Signs:  Pulse/HCG Rate: (!) 119  Temp: 97.9 F (36.6 C) Resp: 20 BP: 119/70 SpO2: 99 %  EBL: None  Complications: No immediate post-treatment complications observed by team, or reported by patient.  Note: The patient tolerated the entire procedure well. A repeat set of vitals were taken after the procedure and the  patient was kept under observation following institutional policy, for this type of procedure. Post-procedural neurological assessment was performed, showing return to baseline, prior to discharge. The patient was provided with post-procedure discharge instructions, including a section on how to identify potential problems. Should any problems arise concerning this procedure, the patient was given instructions to immediately contact us, at any time, without hesitation. In any case, we plan to contact the patient by telephone for a follow-up status report regarding this interventional procedure.  Comments:  No additional relevant information. 5 out of 5 strength bilateral lower extremity: Plantar flexion, dorsiflexion, knee flexion, knee extension.  Plan of Care  Orders:  Orders Placed This Encounter  Procedures  . Fluoro (C-Arm) (<60 min) (No Report)    Intraoperative interpretation by procedural physician at [facility].    Standing Status:   Standing    Number of Occurrences:   1    Order Specific Question:   Reason for exam:    Answer:   Assistance in needle guidance and placement for procedures requiring needle placement in or near specific anatomical locations not easily accessible without such assistance.    Medications ordered for procedure: Meds ordered this encounter  Medications  . iohexol (OMNIPAQUE) 180 MG/ML injection 10 mL    Must be Myelogram-compatible. If not available, you may substitute with a water-soluble, non-ionic, hypoallergenic, myelogram-compatible radiological contrast medium.  Marland Kitchen lidocaine (XYLOCAINE) 2 % (with pres) injection 400 mg  . ropivacaine (PF) 2 mg/mL (0.2%) (NAROPIN) injection 2 mL  . sodium chloride flush (NS) 0.9 % injection 2 mL  . dexamethasone (DECADRON) injection 10 mg   Medications administered: We administered iohexol, lidocaine, ropivacaine (PF) 2 mg/mL (0.2%), sodium chloride flush, and dexamethasone.  See the medical record for  exact dosing, route, and time of administration.  Follow-up plan:   Return in about 5 weeks (around 07/31/2019) for Post Procedure Evaluation, virtual.  Status post right L4/5 ESI #1 on 05/01/2019: 8 cc injected, #2 06/26/19: 9 cc injected   Recent Visits Date Type Provider Dept  06/03/19 Office Visit Gillis Santa, MD [facility]  05/01/19 Procedure visit Gillis Santa, MD [facility]  04/16/19 Office Visit Gillis Santa, MD [facility]  Showing recent visits within past 90 days and meeting all other requirements   Today's Visits Date Type Provider Dept  06/26/19 Procedure visit Gillis Santa, MD [facility]  Showing today's visits and meeting all other requirements   Future Appointments Date Type Provider Dept  07/09/19 Appointment Gillis Santa, MD [facility]  07/31/19 Appointment Gillis Santa, MD [facility]  Showing future appointments within next 90 days and meeting all other requirements   Disposition: Discharge home  Discharge Date & Time: 06/26/2019;   hrs.   Primary Care Physician: Donnie Coffin, MD Location: [facility] Note by: Gillis Santa, MD Date: 06/26/2019; Time: 1:30 PM  Disclaimer:  Medicine is not an exact science. The only guarantee in medicine is that nothing is guaranteed. It is important to note that the decision to proceed with this intervention was based on the information collected from the patient. The Data and conclusions were drawn from the patient's questionnaire, the interview, and the physical examination. Because the information was provided in large part by the patient, it cannot be guaranteed that it has not been purposely or unconsciously manipulated. Every effort has been made to obtain as much relevant data as possible for this evaluation. It is important to note that the conclusions that lead to this procedure are derived in large part from the available  data. Always take into account that the treatment will also be dependent on availability of resources and existing treatment guidelines, considered by other Pain Management Practitioners as being common knowledge and practice, at the time of the intervention. For Medico-Legal purposes, it is also important to point out that variation in procedural techniques and pharmacological choices are the acceptable norm. The indications, contraindications, technique, and results of the above procedure should only be interpreted and judged by a Board-Certified Interventional Pain Specialist with extensive familiarity and expertise in the same exact procedure and technique.

## 2019-06-27 ENCOUNTER — Telehealth: Payer: Self-pay

## 2019-06-27 NOTE — Telephone Encounter (Signed)
Post procedure phone call. Patient states she is doing good.  

## 2019-07-08 ENCOUNTER — Encounter: Payer: Self-pay | Admitting: Student in an Organized Health Care Education/Training Program

## 2019-07-09 ENCOUNTER — Other Ambulatory Visit: Payer: Self-pay

## 2019-07-09 ENCOUNTER — Ambulatory Visit
Payer: BC Managed Care – PPO | Attending: Student in an Organized Health Care Education/Training Program | Admitting: Student in an Organized Health Care Education/Training Program

## 2019-07-09 ENCOUNTER — Encounter: Payer: Self-pay | Admitting: Student in an Organized Health Care Education/Training Program

## 2019-07-09 DIAGNOSIS — R202 Paresthesia of skin: Secondary | ICD-10-CM

## 2019-07-09 DIAGNOSIS — G894 Chronic pain syndrome: Secondary | ICD-10-CM | POA: Diagnosis not present

## 2019-07-09 DIAGNOSIS — G608 Other hereditary and idiopathic neuropathies: Secondary | ICD-10-CM | POA: Diagnosis not present

## 2019-07-09 DIAGNOSIS — G8929 Other chronic pain: Secondary | ICD-10-CM

## 2019-07-09 DIAGNOSIS — M5416 Radiculopathy, lumbar region: Secondary | ICD-10-CM | POA: Diagnosis not present

## 2019-07-09 MED ORDER — PREDNISONE 20 MG PO TABS: ORAL_TABLET | ORAL | 0 refills | Status: AC

## 2019-07-09 NOTE — Progress Notes (Signed)
Patient: Kara West  Service Category: E/M  Provider: Gillis Santa, MD  DOB: 20-Apr-1976  DOS: 07/09/2019  Location: Office  MRN: [id number]  Setting: Ambulatory outpatient  Referring Provider: Donnie Coffin, MD  Type: Established Patient  Specialty: Interventional Pain Management  PCP: Donnie Coffin, MD  Location: Home  Delivery: TeleHealth     Virtual Encounter - Pain Management PROVIDER NOTE: Information contained herein reflects review and annotations entered in association with encounter. Interpretation of such information and data should be left to medically-trained personnel. Information provided to patient can be located elsewhere in the medical record under "Patient Instructions". Document created using STT-dictation technology, any transcriptional errors that may result from process are unintentional.    Contact & Pharmacy Preferred: [phone] Home: [phone] (home) Mobile: [phone] (mobile) E-mail: [email or web address]  [facility] [address] [place]) [place] [address] Phone: [phone] Fax: [phone]   Pre-screening  Ms. Escorcia offered "in-person" vs "virtual" encounter. She indicated preferring virtual for this encounter.   Reason COVID-19*  Social distancing based on CDC and AMA recommendations.   I contacted [name] on 07/09/2019 via video conference.      I clearly identified myself as Gillis Santa, MD. I verified that I was speaking with the correct person using two identifiers (Name: Wilbert Monter, and date of birth: [birth year]).   Consent I sought verbal advanced consent from [name] for virtual visit interactions. I informed Ms. Campana of possible security and privacy concerns, risks, and limitations associated with providing "not-in-person" medical evaluation and management services. I also informed Ms. Adan of the  availability of "in-person" appointments. Finally, I informed her that there would be a charge for the virtual visit and that she could be  personally, fully or partially, financially responsible for it. Ms. Archacki expressed understanding and agreed to proceed.   Historic Elements   Ms. Jenisa Laubscher is a 44 y.o. year old, female patient evaluated today after her last encounter by our practice on 06/27/2019. Ms. Hehr  has a past medical history of Anemia, Bronchitis, COPD (chronic obstructive pulmonary disease) ([place]), GERD (gastroesophageal reflux disease), and History of Papanicolaou smear of cervix (10/12/2011). She also  has a past surgical history that includes Cesarean section; Endometrial ablation (11/11/2011); Dilation and curettage of uterus (11/11/2011); and Tubal ligation (1997). Ms. Golin has a current medication list which includes the following prescription(s): albuterol, amitriptyline, celecoxib, diclofenac, duloxetine, ferrous sulfate, ajovy, gabapentin, rizatriptan, aerochamber mv, thiamine, topiramate, and prednisone. She  reports that she quit smoking about 5 years ago. Her smoking use included cigarettes. She has a 1.00 pack-year smoking history. She has never used smokeless tobacco. She reports current alcohol use. She reports that she does not use drugs. Ms. Glendinning has No Known Allergies.   HPI  Today, she is being contacted for a post-procedure assessment.   Patient is status post right L4-L5 ESI #2 on 06/26/2019.  Patient states that she has seen a decrease in the frequency and the intensity of her radiating pain but it continues to be present on her right side.  She states that her left side is doing well which was flaring up at the time of the injection.  She states that her paresthesias in her feet have decreased.  Will prescribe prednisone taper.  Patient denies having received any p.o. steroids in the last 3 months.  Risks and benefits of steroid therapy  reviewed.  Laboratory  Chemistry Profile (12 mo)  Renal: 08/31/2018: BUN 17; Creatinine, Ser 0.64  Lab Results  Component Value Date   GFRAA >60 08/31/2018   GFRNONAA >60 08/31/2018   Hepatic: No results found for requested labs within last 8760 hours. Lab Results  Component Value Date   AST 17 11/19/2017   ALT 13 (L) 11/19/2017   Other: No results found for requested labs within last 8760 hours. Note: Above Lab results reviewed.    Assessment  The primary encounter diagnosis was Chronic radicular lumbar pain. Diagnoses of Sensory polyneuropathy, Paresthesia of both feet, and Chronic pain syndrome were also pertinent to this visit.  Plan of Care   I am having Allie Dimmer start on predniSONE. I am also having her maintain her AeroChamber MV, albuterol, ferrous sulfate, thiamine, DULoxetine, Ajovy, rizatriptan, topiramate, diclofenac, gabapentin, amitriptyline, and celecoxib.  Pharmacotherapy (Medications Ordered): Meds ordered this encounter  Medications  . predniSONE (DELTASONE) 20 MG tablet    Sig: Take 3 tablets (60 mg total) by mouth daily with breakfast for 3 days, THEN 2 tablets (40 mg total) daily with breakfast for 3 days, THEN 1 tablet (20 mg total) daily with breakfast for 3 days.    Dispense:  18 tablet    Refill:  0   Follow-up plan:   Return if symptoms worsen or fail to improve.     Status post right L4/5 ESI #1 on 05/01/2019: 8 cc injected, #2 06/26/19: 9 cc injected    Recent Visits Date Type Provider Dept  06/26/19 Procedure visit Gillis Santa, MD [facility]  06/03/19 Office Visit Gillis Santa, MD [facility]  05/01/19 Procedure visit Gillis Santa, MD [facility]  04/16/19 Office Visit Gillis Santa, MD [facility]  Showing recent visits within past 90 days and meeting all other requirements   Today's Visits Date Type Provider Dept  07/09/19 Office Visit Gillis Santa, MD [facility]  Showing  today's visits and meeting all other requirements   Future Appointments Date Type Provider Dept  07/31/19 Appointment Gillis Santa, MD [facility]  Showing future appointments within next 90 days and meeting all other requirements   I discussed the assessment and treatment plan with the patient. The patient was provided an opportunity to ask questions and all were answered. The patient agreed with the plan and demonstrated an understanding of the instructions.  Patient advised to call back or seek an in-person evaluation if the symptoms or condition worsens.  Duration of encounter: 20 minutes.  Note by: Gillis Santa, MD Date: 07/09/2019; Time: 2:01 PM

## 2019-07-29 ENCOUNTER — Telehealth: Payer: BC Managed Care – PPO | Admitting: Pain Medicine

## 2019-07-30 ENCOUNTER — Telehealth: Payer: Self-pay | Admitting: *Deleted

## 2019-07-30 ENCOUNTER — Encounter: Payer: Self-pay | Admitting: Student in an Organized Health Care Education/Training Program

## 2019-07-30 NOTE — Telephone Encounter (Signed)
Attempted to call for pre appointment review of allergies/meds. Message left. 

## 2019-07-31 ENCOUNTER — Other Ambulatory Visit: Payer: Self-pay

## 2019-07-31 ENCOUNTER — Encounter: Payer: Self-pay | Admitting: Student in an Organized Health Care Education/Training Program

## 2019-07-31 ENCOUNTER — Ambulatory Visit
Payer: BC Managed Care – PPO | Attending: Student in an Organized Health Care Education/Training Program | Admitting: Student in an Organized Health Care Education/Training Program

## 2019-07-31 DIAGNOSIS — G608 Other hereditary and idiopathic neuropathies: Secondary | ICD-10-CM | POA: Diagnosis not present

## 2019-07-31 DIAGNOSIS — G8929 Other chronic pain: Secondary | ICD-10-CM

## 2019-07-31 DIAGNOSIS — G894 Chronic pain syndrome: Secondary | ICD-10-CM | POA: Diagnosis not present

## 2019-07-31 DIAGNOSIS — M5416 Radiculopathy, lumbar region: Secondary | ICD-10-CM

## 2019-07-31 DIAGNOSIS — R202 Paresthesia of skin: Secondary | ICD-10-CM

## 2019-07-31 MED ORDER — ALPHA-LIPOIC ACID 600 MG PO CAPS: 600.0000 mg | ORAL_CAPSULE | Freq: Every day | ORAL | 0 refills | Status: AC

## 2019-07-31 NOTE — Progress Notes (Signed)
Patient: Kara West  Service Category: E/M  Provider: Gillis Santa, MD  DOB: 03/07/1976  DOS: 07/31/2019  Location: Office  MRN: [id number]  Setting: Ambulatory outpatient  Referring Provider: Donnie Coffin, MD  Type: Established Patient  Specialty: Interventional Pain Management  PCP: Donnie Coffin, MD  Location: Home  Delivery: TeleHealth     Virtual Encounter - Pain Management PROVIDER NOTE: Information contained herein reflects review and annotations entered in association with encounter. Interpretation of such information and data should be left to medically-trained personnel. Information provided to patient can be located elsewhere in the medical record under "Patient Instructions". Document created using STT-dictation technology, any transcriptional errors that may result from process are unintentional.    Contact & Pharmacy Preferred: [phone] Home: [phone] (home) Mobile: [phone] (mobile) E-mail: [email or web address]  [place]) [place] [address] Phone: [phone] Fax: [phone]   Pre-screening  Kara West offered "in-person" vs "virtual" encounter. Kara West indicated preferring virtual for this encounter.   Reason COVID-19*  Social distancing based on CDC and AMA recommendations.   I contacted Kara West on 07/31/2019 via telephone.      I clearly identified myself as Gillis Santa, MD. I verified that I was speaking with the correct person using two identifiers (Name: Kara West, and date of birth: 01-10-1976).  This visit was completed via telephone due to the restrictions of the COVID-19 pandemic. All issues as above were discussed and addressed but no physical exam was performed. If it was felt that the patient should be evaluated in the office, they were directed there. The patient verbally consented to this visit. Patient was  unable to complete an audio/visual visit due to Technical difficulties and/or Lack of internet. Due to the catastrophic nature of the COVID-19 pandemic, this visit was done through audio contact only.  Location of the patient: home address (see Epic for details)  Location of the provider: office  Consent I sought verbal advanced consent from Kara West for virtual visit interactions. I informed Kara West of possible security and privacy concerns, risks, and limitations associated with providing "not-in-person" medical evaluation and management services. I also informed Kara West of the availability of "in-person" appointments. Finally, I informed Kara West that there would be a charge for the virtual visit and that Kara West could be  personally, fully or partially, financially responsible for it. Kara West expressed understanding and agreed to proceed.   Historic Elements   Kara West is a 44 y.o. year old, female patient evaluated today after Kara West last contact with our practice on 07/30/2019. Kara West  has a past medical history of Anemia, Bronchitis, COPD (chronic obstructive pulmonary disease) ([place]), GERD (gastroesophageal reflux disease), and History of Papanicolaou smear of cervix (10/12/2011). Kara West also  has a past surgical history that includes Cesarean section; Endometrial ablation (11/11/2011); Dilation and curettage of uterus (11/11/2011); and Tubal ligation (1997). Kara West has a current medication list which includes the following prescription(s): albuterol, amitriptyline, diclofenac, duloxetine, ferrous sulfate, ajovy, rizatriptan, aerochamber mv, thiamine, topiramate, alpha-lipoic acid, and gabapentin. Kara West  reports that Kara West quit smoking about 5 years ago. Kara West smoking use included cigarettes. Kara West has a 1.00 pack-year smoking history. Kara West has never used smokeless tobacco. Kara West reports current alcohol use. Kara West reports that Kara West does not use drugs. Kara West has No  Known Allergies.   HPI  Today, Kara West is being contacted for medication management.  Patient continues to endorse persistent and severe bilateral foot pain related to sensory polyneuropathy as well as occasional low back pain.  At Kara West last visit given that Kara West did not obtain as much benefit as we had hoped with Kara West second epidural steroid injection, prednisone taper was prescribed.  This did not help Kara West pain.  Kara West states that Kara West is having difficulty working as a result of Kara West foot pain.  In regards to treatment plans, Kara West is on amitriptyline 50 mg nightly, diclofenac 75 mg twice daily, Cymbalta 60 mg daily, gabapentin 600 mg 3 times daily.  Kara West continues to endorse paresthesias.  We discussed a lidocaine infusion to see if that would help Kara West pain.  Patient's previous EKG was performed August 31, 2018 and Kara West QTC at that time was 463.  Laboratory Chemistry Profile   Renal Lab Results  Component Value Date   BUN 17 08/31/2018   CREATININE 0.64 08/31/2018   GFRAA >60 08/31/2018   GFRNONAA >60 08/31/2018    Hepatic Lab Results  Component Value Date   AST 17 11/19/2017   ALT 13 (L) 11/19/2017   ALBUMIN 3.9 11/19/2017   ALKPHOS 63 11/19/2017   LIPASE 15 (L) 10/27/2014    Electrolytes Lab Results  Component Value Date   NA 137 08/31/2018   K 3.1 (L) 08/31/2018   CL 109 08/31/2018   CALCIUM 8.8 (L) 08/31/2018   MG 2.1 06/19/2018    Bone Lab Results  Component Value Date   VD25OH 23 (L) 02/14/2017    Coagulation Lab Results  Component Value Date   PLT 201 08/31/2018    Cardiovascular Lab Results  Component Value Date   BNP 41.0 11/19/2017   CKTOTAL 39 06/25/2016   CKMB < 0.5 (L) 06/16/2012   TROPONINI <0.03 08/31/2018   HGB 12.8 08/31/2018   HCT 38.9 08/31/2018    Inflammation (CRP: Acute Phase) (ESR: Chronic Phase) No results found for: CRP, ESRSEDRATE, LATICACIDVEN    Note: Above Lab results reviewed.  Imaging  Fluoro (C-Arm) (<60 min) (No Report) Fluoro was used,  but no Radiologist interpretation will be provided.  Please refer to "NOTES" tab for provider progress note.  Assessment  The primary encounter diagnosis was Chronic radicular lumbar pain. Diagnoses of Sensory polyneuropathy, Paresthesia of both feet, and Chronic pain syndrome were also pertinent to this visit.  Plan of Care   I am having Kara West start on Alpha-Lipoic Acid. I am also having Kara West maintain Kara West AeroChamber MV, albuterol, ferrous sulfate, thiamine, DULoxetine, Ajovy, rizatriptan, topiramate, diclofenac, gabapentin, and amitriptyline.  1.  Continue gabapentin, Cymbalta, amitriptyline, diclofenac. 2.  Start alpha lipoic acid 600 mg daily 3.  Plan for lidocaine infusion for sensory polyneuropathy.  QTc 463 on EKG dated 08/31/2018.  We will need to repeat. 4.  Also discussed spinal cord stimulation again.  Discussed what a trial entails.  Can consider this in the future.  Pharmacotherapy (Medications Ordered): Meds ordered this encounter  Medications  . Alpha-Lipoic Acid 600 MG CAPS    Sig: Take 1 capsule (600 mg total) by mouth daily.    Dispense:  30 capsule    Refill:  0    Do not place medication on "Automatic Refill". Fill one day early if pharmacy is closed on scheduled refill date.   Orders:  Orders Placed This Encounter  Procedures  . LIDOCAINE INFUSION    Standing Status:   Future    Standing Expiration Date:   08/30/2019    Scheduling Instructions:  Laterality: Does not apply     Level(s): Peripheral vascular access     Sedation: With IV Sedation     Scheduling Timeframe: As soon as pre-approved     NOTE: Continuous cardiovascular monitoring required (ECG, NIBPM, SpO2)    Order Specific Question:   Where will this procedure be performed?    Answer:   [facility]  . EKG 12-Lead    This ECG is being ordered as part of a work-up prior to high-risk medication prescribing.    Scheduling Instructions:     Please evaluate for QT/QTc  prolongation, as well as 2nd degree AV blocks prior to high-risk medication therapy.   Follow-up plan:   Return in about 1 week (around 08/07/2019) for Lidocaine gtt (patient to get EKG that AM).     Status post right L4/5 ESI #1 on 05/01/2019: 8 cc injected, #2 06/26/19: 9 cc injected     Recent Visits Date Type Provider Dept  07/09/19 Office Visit Gillis Santa, MD [facility]  06/26/19 Procedure visit Gillis Santa, MD [facility]  06/03/19 Office Visit Gillis Santa, MD [facility]  Showing recent visits within past 90 days and meeting all other requirements   Today's Visits Date Type Provider Dept  07/31/19 Office Visit Gillis Santa, MD [facility]  Showing today's visits and meeting all other requirements   Future Appointments No visits were found meeting these conditions.  Showing future appointments within next 90 days and meeting all other requirements   I discussed the assessment and treatment plan with the patient. The patient was provided an opportunity to ask questions and all were answered. The patient agreed with the plan and demonstrated an understanding of the instructions.  Patient advised to call back or seek an in-person evaluation if the symptoms or condition worsens.  Duration of encounter: 25 minutes.  Note by: Gillis Santa, MD Date: 07/31/2019; Time: 1:36 PM

## 2019-08-02 ENCOUNTER — Other Ambulatory Visit: Payer: Self-pay

## 2019-08-02 ENCOUNTER — Ambulatory Visit
Admission: RE | Admit: 2019-08-02 | Discharge: 2019-08-02 | Disposition: A | Discharge status: Home / Self Care | Payer: BC Managed Care – PPO | Source: Ambulatory Visit | Attending: Family Medicine | Admitting: Family Medicine

## 2019-08-02 DIAGNOSIS — G894 Chronic pain syndrome: Secondary | ICD-10-CM | POA: Diagnosis not present

## 2019-08-02 DIAGNOSIS — Z01810 Encounter for preprocedural cardiovascular examination: Secondary | ICD-10-CM | POA: Insufficient documentation

## 2019-08-02 DIAGNOSIS — M5416 Radiculopathy, lumbar region: Secondary | ICD-10-CM | POA: Insufficient documentation

## 2019-08-07 ENCOUNTER — Ambulatory Visit
Payer: BC Managed Care – PPO | Attending: Student in an Organized Health Care Education/Training Program | Admitting: Student in an Organized Health Care Education/Training Program

## 2019-08-07 ENCOUNTER — Encounter: Payer: Self-pay | Admitting: Student in an Organized Health Care Education/Training Program

## 2019-08-07 ENCOUNTER — Other Ambulatory Visit: Payer: Self-pay

## 2019-08-07 VITALS — BP 120/85 | HR 89 | Temp 97.9°F | Resp 17 | Ht 69.0 in | Wt 243.2 lb

## 2019-08-07 DIAGNOSIS — R202 Paresthesia of skin: Secondary | ICD-10-CM

## 2019-08-07 DIAGNOSIS — G608 Other hereditary and idiopathic neuropathies: Secondary | ICD-10-CM | POA: Insufficient documentation

## 2019-08-07 MED ORDER — LIDOCAINE IN D5W 4-5 MG/ML-% IV SOLN: 4.0000 mg/min | INTRAVENOUS | Status: AC

## 2019-08-07 MED ORDER — LIDOCAINE IN D5W 4-5 MG/ML-% IV SOLN
INTRAVENOUS | Status: AC
  Filled 2019-08-07: qty 500

## 2019-08-07 NOTE — Progress Notes (Signed)
PROVIDER NOTE: Information contained herein reflects review and annotations entered in association with encounter. Interpretation of such information and data should be left to medically-trained personnel. Information provided to patient can be located elsewhere in the medical record under "Patient Instructions". Document created using STT-dictation technology, any transcriptional errors that may result from process are unintentional.    Patient: Kara West  Service Category: Procedure  Provider: Gillis Santa, MD  DOB: Mar 03, 1976  DOS: 08/07/2019  Location: [facility]  MRN: [id number]  Setting: Ambulatory - outpatient  Referring Provider: Donnie Coffin, MD  Type: Established Patient  Specialty: Interventional Pain Management  PCP: Donnie Coffin, MD   Primary Reason for Visit: Interventional Pain Management Treatment. CC: burning pain in feet and legs Procedure:          Anesthesia, Analgesia, Anxiolysis:  Type: Palliative Intravenous lidocaine infusion #1 Region: Systemic Level: Upper Extremity IV access Laterality: Please see nurses note.  Type: Moderate (Conscious) Sedation Indication(s): Analgesia, anxiolysis, and seizure premedication Route: Intravenous (IV) IV Access: Secured Sedation: Meaningful verbal contact was maintained at all times during the procedure   Position: Supine   Indications: 1. Sensory polyneuropathy   2. Paresthesia of both feet    Pain Score: Pre-procedure: 10-Worst pain ever/10 Post-procedure: 7 /10   Patient is here for lidocaine infusion for sensory polyneuropathy.  Most recent EKG showed a QTC of 464 ms.  Pre-op Assessment:  Kara West is a 44 y.o. (year old), female patient, seen today for interventional treatment. She  has a past surgical history that includes Cesarean section; Endometrial ablation (11/11/2011); Dilation and curettage of uterus (11/11/2011); and Tubal ligation (1997). Kara West has a current medication  list which includes the following prescription(s): albuterol, alpha-lipoic acid, amitriptyline, diclofenac, duloxetine, ferrous sulfate, ajovy, meloxicam, rizatriptan, aerochamber mv, thiamine, topiramate, and gabapentin, and the following Facility-Administered Medications: lidocaine. Her primarily concern today is the No chief complaint on file.  Initial Vital Signs:  Pulse/HCG Rate: 89ECG Heart Rate: 75 Temp: 97.9 F (36.6 C) Resp: 16 BP: 114/80 SpO2: 98 %  BMI: Estimated body mass index is 35.91 kg/m as calculated from the following:   Height as of this encounter: 5\' 9"  (1.753 m).   Weight as of this encounter: 243 lb 3.2 oz (110.3 kg).  Risk Assessment: Allergies: Reviewed. She has No Known Allergies.  Allergy Precautions: None required Coagulopathies: Reviewed. None identified.  Blood-thinner therapy: None at this time Active Infection(s): Reviewed. None identified. Kara West is afebrile  Site Confirmation: Kara West was asked to confirm the procedure and laterality before marking the site Procedure checklist: Completed Consent: Before the procedure and under the influence of no sedative(s), amnesic(s), or anxiolytics, the patient was informed of the treatment options, risks and possible complications. To fulfill our ethical and legal obligations, as recommended by the American Medical Association's Code of Ethics, I have informed the patient of my clinical impression; the nature and purpose of the treatment or procedure; the risks, benefits, and possible complications of the intervention; the alternatives, including doing nothing; the risk(s) and benefit(s) of the alternative treatment(s) or procedure(s); and the risk(s) and benefit(s) of doing nothing. The patient was provided information about the general risks and possible complications associated with any invasive procedure. These may include, but are not limited to: failure to achieve desired goals; pain; worsening of  initial condition; infections; bleeding; organ or nerve damage; allergic reactions; and death. In addition, the patient was informed of those risks and complications associated to  this procedure, such as failure to decrease pain; infection; bleeding; phlebitis; vascular extravasation; tissue necrosis; tenderness at IV access site; skin or nerve damage with subsequent damage to sensory, motor, and/or autonomic systems; worsening of the pain; persistent pain, numbness, and/or weakness of one or several areas of the body; cardiac dysrhythmias; seizure; stroke; and/or death. Furthermore, the patient was informed of those risks and complications associated with the medications used during the procedure. These include, but are not limited to: allergic reactions (i.e.: anaphylactic or anaphylactoid reactions); cardiac conduction blockade; poisoning; toxicity; CNS depression; cardiovascular depression and collapse; muscle twitching; tonic-clonic seizures; convulsions; loss of consciousness; coma; respiratory depression; arrest; and/or death. Finally, the patient was informed that Medicine is not an exact science; therefore, there is also the possibility of unforeseen or unpredictable risks and/or possible complications that may result in a catastrophic outcome. The patient indicated having understood very clearly. We have given the patient no guarantees and we have made no promises. Enough time was given to the patient to ask questions, all of which were answered to the patient's satisfaction. Kara West has indicated that she wanted to continue with the procedure. Attestation: I, the ordering provider, attest that I have discussed with the patient the benefits, risks, side-effects, alternatives, likelihood of achieving goals, and potential problems during recovery for the procedure that I have provided informed consent. Date  Time: 08/07/2019 10:29 AM  Pre-Procedure Preparation:  Monitoring: As per clinic  protocol. Respiration, ETCO2, SpO2, BP, heart rate and rhythm monitor placed and checked for adequate function Safety Precautions: Patient was assessed for positional comfort and pressure points before starting the procedure. Time-out: I initiated and conducted the "Time-out" before starting the procedure, as per protocol. The patient was asked to participate by confirming the accuracy of the "Time Out" information. Verification of the correct person, site, and procedure were performed and confirmed by me, the nursing staff, and the patient. "Time-out" conducted as per Joint Commission's Universal Protocol (UP.01.01.01). Time: 1120  Description of Procedure:          Target Area: Intravenous Approach: Intravenous angiocath approach. Area Prepped: Antecubital Prepping solution: DuraPrep (Iodine Povacrylex [0.7% available iodine] and Isopropyl Alcohol, 74% w/w) Safety Precautions: Medications properly checked for expiration dates. SDV (single dose vial) medications used.  Dose Calculation: Lidocaine preparation: 2 grams of IV lidocaine in 500 mL of D5W (4 mg/mL) (0.4% Lidocaine) Maximum Lidocaine Dose: 4 mg/kg x 243 lb 3.2 oz (110.3 kg) =  440 mg.  Calculated infusion rate:440 mg divided by 4 mg/mL = 125mL in 60 minutes (110 mL/hr) Set rate: 110 mL/hr Duration of Infusion: 1 hour  Description of the Procedure: Protocol guidelines were followed. The patient was placed in position. Informed consent was obtained.  She was cautioned to be on the look out for prodromal symptoms of a possible impending seizure, such as: new onset tinnitus; perioral, circumoral, or tongue numbness; or a metallic taste in her mouth, lightheadedness; dizziness; visual or auditory disturbances; disorientation; profound drowsiness; or muscle twitching. An IV was started and the patient's dose calculated as above. The infusion was carried out with a nurse at her side 100% of the time, monitoring her vitals as per protocol.  Pre-procedure sedation was started 5-10 minutes before infusion and midazolam was kept at bedside during the entire procedure, along with CPR equipment, as per protocol.  Vitals:   08/07/19 1231 08/07/19 1235 08/07/19 1238 08/07/19 1240  BP: 127/80 122/80 123/81 120/85  Pulse:      Resp: 19  18 18 17   Temp:      TempSrc:      SpO2: 99% 99% 99% 99%  Weight:      Height:        Start Time: 1121 hrs. End Time: 1238 hrs. Materials: IV infusion pump Medication(s): IV Lidocaine.  Imaging Guidance:          Type of Imaging Technique: None used Indication(s): N/A Exposure Time: No patient exposure Contrast: None used. Fluoroscopic Guidance: N/A Ultrasound Guidance: N/A Interpretation: N/A  Antibiotic Prophylaxis:   Anti-infectives (From admission, onward)   None     Indication(s): None identified  Post-operative Assessment:  Post-procedure Vital Signs:  Pulse/HCG Rate: 8981 Temp: 97.9 F (36.6 C) Resp: 17 BP: 120/85 SpO2: 99 %  EBL: None  Complications: No immediate post-treatment complications observed by team, or reported by patient.  Note: The patient tolerated the entire procedure well. A repeat set of vitals were taken after the procedure and the patient was kept under observation following institutional policy, for this type of procedure. Post-procedural neurological assessment was performed, showing return to baseline, prior to discharge. The patient was provided with post-procedure discharge instructions, including a section on how to identify potential problems. Should any problems arise concerning this procedure, the patient was given instructions to immediately contact us, at any time, without hesitation. In any case, we plan to contact the patient by telephone for a follow-up status report regarding this interventional procedure.  Comments:  No additional relevant information.  Plan of Care    Medications ordered for procedure: Meds ordered this encounter   Medications  . lidocaine (cardiac) 2000 mg in dextrose 5% 500 mL (4mg /mL) IV infusion    IV Lidocaine 2 grams in 500 mL of Lactated Ringer's(LR). (4 mg/mL) (0.4% Lidocaine)   Disposition: Discharge home  Discharge Date & Time: 08/07/2019;   hrs.   Physician-requested Follow-up: Return in about 4 weeks (around 09/04/2019) for Post Procedure Evaluation, virtual. Recent Visits Date Type Provider Dept  07/31/19 Office Visit Gillis Santa, MD [facility]  07/09/19 Office Visit Gillis Santa, MD [facility]  06/26/19 Procedure visit Gillis Santa, MD [facility]  06/03/19 Office Visit Gillis Santa, MD [facility]  Showing recent visits within past 90 days and meeting all other requirements   Today's Visits Date Type Provider Dept  08/07/19 Procedure visit Gillis Santa, MD [facility]  Showing today's visits and meeting all other requirements   Future Appointments No visits were found meeting these conditions.  Showing future appointments within next 90 days and meeting all other requirements   Primary Care Physician: Donnie Coffin, MD Location: [facility] Note by: Gillis Santa, MD Date: 08/07/2019; Time: 12:43 PM  Disclaimer:  Medicine is not an exact science. The only guarantee in medicine is that nothing is guaranteed. It is important to note that the decision to proceed with this intervention was based on the information collected from the patient. The Data and conclusions were drawn from the patient's questionnaire, the interview, and the physical examination. Because the information was provided in large part by the patient, it cannot be guaranteed that it has not been purposely or unconsciously manipulated. Every effort has been made to obtain as much relevant data as possible for this evaluation. It is important to note that the conclusions that lead to this procedure are derived in large part from the  available data. Always take into account that the treatment will also be  dependent on availability of resources and existing treatment guidelines, considered by other Pain Management Practitioners as being common knowledge and practice, at the time of the intervention. For Medico-Legal purposes, it is also important to point out that variation in procedural techniques and pharmacological choices are the acceptable norm. The indications, contraindications, technique, and results of the above procedure should only be interpreted and judged by a Board-Certified Interventional Pain Specialist with extensive familiarity and expertise in the same exact procedure and technique.

## 2019-08-07 NOTE — Progress Notes (Signed)
Safety precautions to be maintained throughout the outpatient stay will include: orient to surroundings, keep bed in low position, maintain call bell within reach at all times, provide assistance with transfer out of bed and ambulation.  

## 2019-08-08 ENCOUNTER — Telehealth: Payer: Self-pay

## 2019-08-08 NOTE — Telephone Encounter (Signed)
Denies any needs at this time. "I am doing fine" Instructed to call if needed.

## 2019-08-14 ENCOUNTER — Other Ambulatory Visit: Payer: Self-pay

## 2019-08-14 ENCOUNTER — Encounter: Payer: Self-pay | Admitting: Emergency Medicine

## 2019-08-14 ENCOUNTER — Emergency Department
Admission: EM | Admit: 2019-08-14 | Discharge: 2019-08-14 | Disposition: A | Discharge status: Home / Self Care | Payer: BC Managed Care – PPO | Attending: Student in an Organized Health Care Education/Training Program | Admitting: Student in an Organized Health Care Education/Training Program

## 2019-08-14 DIAGNOSIS — Z791 Long term (current) use of non-steroidal anti-inflammatories (NSAID): Secondary | ICD-10-CM | POA: Diagnosis not present

## 2019-08-14 DIAGNOSIS — M79671 Pain in right foot: Secondary | ICD-10-CM | POA: Diagnosis not present

## 2019-08-14 DIAGNOSIS — J449 Chronic obstructive pulmonary disease, unspecified: Secondary | ICD-10-CM | POA: Diagnosis not present

## 2019-08-14 DIAGNOSIS — Z87891 Personal history of nicotine dependence: Secondary | ICD-10-CM | POA: Diagnosis not present

## 2019-08-14 DIAGNOSIS — Z79899 Other long term (current) drug therapy: Secondary | ICD-10-CM | POA: Diagnosis not present

## 2019-08-14 DIAGNOSIS — G8929 Other chronic pain: Secondary | ICD-10-CM | POA: Insufficient documentation

## 2019-08-14 MED ORDER — OXYCODONE-ACETAMINOPHEN 5-325 MG PO TABS
1.0000 | ORAL_TABLET | Freq: Once | ORAL | Status: AC
  Administered 2019-08-14: 1 via ORAL
  Filled 2019-08-14: qty 1

## 2019-08-14 MED ORDER — TRAMADOL HCL 50 MG PO TABS: 50.0000 mg | ORAL_TABLET | Freq: Four times a day (QID) | ORAL | 0 refills | Status: DC | PRN

## 2019-08-14 NOTE — ED Provider Notes (Signed)
[facility] Emergency Department Provider Note  ____________________________________________   None    (approximate)  I have reviewed the triage vital signs and the nursing notes.   HISTORY  Chief Complaint Foot Pain    HPI Kara West is a 44 y.o. female presents emergency department complaining of chronic right foot pain.  Patient states that she has had injections in her back and had a lidocaine infusion to try and help with the pain.  She is seen several specialist over the past 3 years.  She states the pain is much worse after returning to work following the lidocaine infusion.  She denies any fever or chills.  No new injury.  Remainder review of systems is negative    Past Medical History:  Diagnosis Date  . Anemia    s/p transfusion  . Bronchitis   . COPD (chronic obstructive pulmonary disease) ([place])   . GERD (gastroesophageal reflux disease)   . History of Papanicolaou smear of cervix 10/12/2011   neg    Patient Active Problem List   Diagnosis Date Noted  . Chronic pain syndrome 04/16/2019  . Chronic radicular lumbar pain 04/16/2019  . Bilateral leg pain 11/14/2018  . Leg pain 07/11/2018  . Lymphedema 07/11/2018  . Menorrhagia with regular cycle 03/06/2018  . Bilateral lower extremity edema 12/18/2017  . Bilateral cellulitis of lower leg 12/18/2017  . Sensory polyneuropathy 08/07/2017  . Paresthesia of both feet 02/16/2017  . Moderate Persistent Asthma 02/18/2015  . Allergic rhinitis 02/18/2015  . COPD exacerbation ([place]) 12/01/2014    Past Surgical History:  Procedure Laterality Date  . CESAREAN SECTION     x3  . DILATION AND CURETTAGE OF UTERUS  11/11/2011   hysteroscopy  . ENDOMETRIAL ABLATION  11/11/2011  . TUBAL LIGATION  1997    Prior to Admission medications   Medication Sig Start Date End Date Taking? Authorizing Provider  albuterol (PROVENTIL HFA;VENTOLIN HFA) 108 (90 Base) MCG/ACT inhaler Inhale 2 puffs  into the lungs every 6 (six) hours as needed for wheezing or shortness of breath. 06/17/16   Johnn Hai, PA-C  Alpha-Lipoic Acid 600 MG CAPS Take 1 capsule (600 mg total) by mouth daily. 07/31/19 08/30/19  Gillis Santa, MD  amitriptyline (ELAVIL) 50 MG tablet Take 1 tablet (50 mg total) by mouth at bedtime. 04/16/19   Gillis Santa, MD  diclofenac (VOLTAREN) 75 MG EC tablet Take 75 mg by mouth 2 (two) times daily.    [provider]  DULoxetine (CYMBALTA) 60 MG capsule TAKE 1 CAPSULE BY MOUTH ONCE DAILY FOR MOOD 11/09/18   [provider]  ferrous sulfate 325 (65 FE) MG tablet Take by mouth.    [provider]  Fremanezumab-vfrm (AJOVY) 225 MG/1.5ML SOSY Inject into the skin. 12/07/18   [provider]  gabapentin (NEURONTIN) 600 MG tablet Take 1 tablet (600 mg total) by mouth 3 (three) times daily. 04/16/19 07/15/19  Gillis Santa, MD  meloxicam (MOBIC) 15 MG tablet Take 15 mg by mouth daily. 07/04/19   [provider]  rizatriptan (MAXALT) 10 MG tablet TAKE ONE TABLET BY MOUTH ONCE AS NEEDED FOR MIGRAINE. MAY TAKE A SECOND DOSE AFTER 2 HOURS IF NEEDED. 12/09/18   [provider]  Spacer/Aero-Holding Chambers (AEROCHAMBER MV) inhaler Use as instructed 02/18/15   Juanito Doom, MD  thiamine 100 MG tablet Take 100 mg by mouth daily.     [provider]  topiramate (TOPAMAX) 50 MG tablet TAKE 1 2 (  ONE HALF) TABLET BY MOUTH NIGHTLY FOR 14 DAYS THEN INCREASE TO 1 TABLET ONCE DAILY AT NIGHT 01/10/19   [provider]  traMADol (ULTRAM) 50 MG tablet Take 1 tablet (50 mg total) by mouth every 6 (six) hours as needed. 08/14/19   Versie Starks, PA-C    Allergies Patient has no known allergies.  Family History  Problem Relation Age of Onset  . Hypertension Mother   . Diabetes Mother     Social History Social History   Tobacco Use  . Smoking status: Former Smoker    Packs/day: 0.20    Years: 5.00    Pack years: 1.00     Types: Cigarettes    Quit date: 06/11/2014    Years since quitting: 5.1  . Smokeless tobacco: Never Used  Substance Use Topics  . Alcohol use: Yes    Alcohol/week: 0.0 standard drinks  . Drug use: No    Review of Systems  Constitutional: No fever/chills Eyes: No visual changes. ENT: No sore throat. Respiratory: Denies cough Cardiovascular: Denies chest pain Gastrointestinal: Denies abdominal pain Genitourinary: Negative for dysuria. Musculoskeletal: Negative for back pain.  Positive for chronic foot pain Skin: Negative for rash. Psychiatric: no mood changes,     ____________________________________________   PHYSICAL EXAM:  VITAL SIGNS: ED Triage Vitals  Enc Vitals Group     BP 08/14/19 1737 128/87     Pulse Rate 08/14/19 1737 89     Resp 08/14/19 1737 17     Temp 08/14/19 1737 98.1 F (36.7 C)     Temp Source 08/14/19 1737 Oral     SpO2 08/14/19 1737 98 %     Weight 08/14/19 1738 243 lb (110.2 kg)     Height 08/14/19 1738 5\' 9"  (1.753 m)     Head Circumference --      Peak Flow --      Pain Score 08/14/19 1737 10     Pain Loc --      Pain Edu? --      Excl. in [place]? --     Constitutional: Alert and oriented. Well appearing and in no acute distress. Eyes: Conjunctivae are normal.  Head: Atraumatic. Nose: No congestion/rhinnorhea. Mouth/Throat: Mucous membranes are moist.   Neck:  supple no lymphadenopathy noted Cardiovascular: Normal rate, regular rhythm. Respiratory: Normal respiratory effort.  No retractions, GU: deferred Musculoskeletal: FROM all extremities, warm and well perfused, right foot is tender on the plantar surface, some tenderness up the bony prominences of the thigh and tibia, no calf tenderness, negative Homans' sign Neurologic:  Normal speech and language.  Skin:  Skin is warm, dry and intact. No rash noted. Psychiatric: Mood and affect are normal. Speech and behavior are normal.  ____________________________________________   LABS  (all labs ordered are listed, but only abnormal results are displayed)  Labs Reviewed - No data to display ____________________________________________   ____________________________________________  RADIOLOGY    ____________________________________________   PROCEDURES  Procedure(s) performed: Percocet 1 p.o., crutches   Procedures    ____________________________________________   INITIAL IMPRESSION / ASSESSMENT AND PLAN / ED COURSE  Pertinent labs & imaging results that were available during my care of the patient were reviewed by me and considered in my medical decision making (see chart for details).   Patient is 44 year old female presents emergency with chronic right foot pain.  See HPI.  Physical exam shows patient to be tender along the foot and leg.  Areas are most tender at the bony prominences.  No swelling or warmth to indicate DVT.  I long discussion with the patient.  She has visited several specialist over the past 3 years.  She is seeing podiatry along with neurosurgery.  She is now being followed at the pain clinic by Dr. Zollie Scale.  He is put injections into her lower spine along with a lidocaine infusion.  She states none of the procedures seem to work.  She states if she rests for a period of time and then starts to bear weight on the right leg it will continue to hurt.  She states she has had a loss of what to do.  Explained to her from the emergency department all that we can do is basically give her pain medication and give her crutches so she will not be bearing weight on the right leg.  Also provide her with a work note.  She is agreeable to this treatment.  She was discharged stable condition.    Kara West was evaluated in Emergency Department on 08/14/2019 for the symptoms described in the history of present illness. She was evaluated in the context of the global COVID-19 pandemic, which necessitated consideration that the patient might be at  risk for infection with the SARS-CoV-2 virus that causes COVID-19. Institutional protocols and algorithms that pertain to the evaluation of patients at risk for COVID-19 are in a state of rapid change based on information released by regulatory bodies including the CDC and federal and state organizations. These policies and algorithms were followed during the patient's care in the ED.   As part of my medical decision making, I reviewed the following data within the [place] notes reviewed and incorporated, Old chart reviewed, Notes from prior ED visits and [place] Controlled Substance Database  ____________________________________________   FINAL CLINICAL IMPRESSION(S) / ED DIAGNOSES  Final diagnoses:  Chronic pain in right foot      NEW MEDICATIONS STARTED DURING THIS VISIT:  New Prescriptions   TRAMADOL (ULTRAM) 50 MG TABLET    Take 1 tablet (50 mg total) by mouth every 6 (six) hours as needed.     Note:  This document was prepared using Dragon voice recognition software and may include unintentional dictation errors.    Versie Starks, PA-C 08/14/19 1815    Merlyn Lot, MD 08/14/19 [phone]

## 2019-08-14 NOTE — ED Triage Notes (Signed)
Pt in via POV, reports pain to right foot with radiation up leg and into back.  Chronic issue x approximately 3 years per pt report.  Received lidocaine infusion recently without any relief.  Ambulatory to triage, NAD noted at this time.

## 2019-08-14 NOTE — ED Notes (Signed)
Pt confirms ride home (daugther).

## 2019-08-15 ENCOUNTER — Telehealth: Payer: Self-pay | Admitting: Student in an Organized Health Care Education/Training Program

## 2019-08-15 NOTE — Telephone Encounter (Signed)
Dr Holley Raring, [facility]

## 2019-08-15 NOTE — Telephone Encounter (Signed)
Patient went to the hospital last night for pain in her right foot. They want her off her foot for a week, she received a script of tramadol for the pain.

## 2019-09-02 ENCOUNTER — Telehealth: Payer: Self-pay | Admitting: *Deleted

## 2019-09-02 NOTE — Telephone Encounter (Signed)
Attempted to call for pre appointment review of allergies/meds. Unable to leave a message, mailbox is full. °

## 2019-09-03 ENCOUNTER — Encounter: Payer: Self-pay | Admitting: Student in an Organized Health Care Education/Training Program

## 2019-09-03 ENCOUNTER — Other Ambulatory Visit: Payer: Self-pay

## 2019-09-03 ENCOUNTER — Ambulatory Visit
Payer: BC Managed Care – PPO | Attending: Student in an Organized Health Care Education/Training Program | Admitting: Student in an Organized Health Care Education/Training Program

## 2019-09-03 DIAGNOSIS — M5416 Radiculopathy, lumbar region: Secondary | ICD-10-CM

## 2019-09-03 DIAGNOSIS — G8929 Other chronic pain: Secondary | ICD-10-CM

## 2019-09-03 DIAGNOSIS — G894 Chronic pain syndrome: Secondary | ICD-10-CM | POA: Diagnosis not present

## 2019-09-03 DIAGNOSIS — R202 Paresthesia of skin: Secondary | ICD-10-CM | POA: Diagnosis not present

## 2019-09-03 DIAGNOSIS — G608 Other hereditary and idiopathic neuropathies: Secondary | ICD-10-CM

## 2019-09-03 MED ORDER — BELBUCA 150 MCG BU FILM: 1.0000 | ORAL_FILM | Freq: Two times a day (BID) | BUCCAL | 0 refills | Status: DC

## 2019-09-03 NOTE — Progress Notes (Signed)
Patient: Kara West  Service Category: E/M  Provider: Gillis Santa, MD  DOB: Nov 04, 1975  DOS: 09/03/2019  Location: Office  MRN: [id number]  Setting: Ambulatory outpatient  Referring Provider: Donnie Coffin, MD  Type: Established Patient  Specialty: Interventional Pain Management  PCP: Donnie Coffin, MD  Location: Home  Delivery: TeleHealth     Virtual Encounter - Pain Management PROVIDER NOTE: Information contained herein reflects review and annotations entered in association with encounter. Interpretation of such information and data should be left to medically-trained personnel. Information provided to patient can be located elsewhere in the medical record under "Patient Instructions". Document created using STT-dictation technology, any transcriptional errors that may result from process are unintentional.    Contact & Pharmacy Preferred: [phone] Home: [phone] (home) Mobile: [phone] (mobile) E-mail: [email or web address]  [place] [address]), [place] - [place] ([place]) [place] [address] Phone: [phone] Fax: [phone]   Pre-screening  Kara West offered "in-person" vs "virtual" encounter. She indicated preferring virtual for this encounter.   Reason COVID-19*  Social distancing based on CDC and AMA recommendations.   I contacted Kara West on 09/03/2019 via telephone.      I clearly identified myself as Gillis Santa, MD. I verified that I was speaking with the correct person using two identifiers (Name: Kara West, and date of birth: 04-30-1976).  This visit was completed via telephone due to the restrictions of the COVID-19 pandemic. All issues as above were discussed and addressed but no physical exam was performed. If it was felt that the patient should be evaluated in the office, they were directed there. The patient verbally consented to this visit. Patient was  unable to complete an audio/visual visit due to Technical difficulties and/or Lack of internet. Due to the catastrophic nature of the COVID-19 pandemic, this visit was done through audio contact only.  Location of the patient: home address (see Epic for details)  Location of the provider: office Consent I sought verbal advanced consent from Kara West for virtual visit interactions. I informed Kara West of possible security and privacy concerns, risks, and limitations associated with providing "not-in-person" medical evaluation and management services. I also informed Kara West of the availability of "in-person" appointments. Finally, I informed her that there would be a charge for the virtual visit and that she could be  personally, fully or partially, financially responsible for it. Kara West expressed understanding and agreed to proceed.   Historic Elements   Kara West is a 44 y.o. year old, female patient evaluated today after her last contact with our practice on 09/02/2019. Kara West  has a past medical history of Anemia, Bronchitis, COPD (chronic obstructive pulmonary disease) ([place]), GERD (gastroesophageal reflux disease), and History of Papanicolaou smear of cervix (10/12/2011). She also  has a past surgical history that includes Cesarean section; Endometrial ablation (11/11/2011); Dilation and curettage of uterus (11/11/2011); and Tubal ligation (1997). Kara West has a current medication list which includes the following prescription(s): albuterol, amitriptyline, diclofenac, duloxetine, ferrous sulfate, ajovy, meloxicam, rizatriptan, aerochamber mv, thiamine, topiramate, belbuca, and gabapentin. She  reports that she quit smoking about 5 years ago. Her smoking use included cigarettes. She has a 1.00 pack-year smoking history. She has never used smokeless tobacco. She reports current alcohol use. She reports that she does not use drugs. Kara West has No  Known Allergies.   HPI  Today, she is being contacted for a post-procedure assessment.  Patient continues to struggle with severe and persistent neuropathic pain of bilateral feet related to sensory polyneuropathy on EMG.  Patient has tried transforaminal epidural steroid injection at L4-L5 with [facility] and she has also tried an interlaminar epidural steroid injection with me for L4-L5 disc herniation potentially affecting the left L4 nerve root.  Patient's lumbar MRI from July 2020 also shows moderate facet hypertrophy L2 L3-L4-L5.  This is likely contributing to her axial low back pain but not so much so her neuropathic pain of her lower extremities.  Patient previously tried a lidocaine infusion with me which was not beneficial.  She did have an emergency department visit on 08/14/2019 for right foot pain, burning and stinging in nature.  Patient is on gabapentin 600 mg 3 times a day, Topamax 50 mg daily, Cymbalta 60 mg daily, amitriptyline 50 mg nightly.  She has also tried tramadol in the past which was not effective along with hydrocodone in the past which was not effective.  We discussed the addition of belbuca today.  This may help provide her with chronic pain relief as she is already optimized on nonopioid analgesics as listed above.  Future considerations could include reconsidering spinal cord stimulator trial.  Laboratory Chemistry Profile   Renal Lab Results  Component Value Date   BUN 17 08/31/2018   CREATININE 0.64 08/31/2018   GFRAA >60 08/31/2018   GFRNONAA >60 08/31/2018    Hepatic Lab Results  Component Value Date   AST 17 11/19/2017   ALT 13 (L) 11/19/2017   ALBUMIN 3.9 11/19/2017   ALKPHOS 63 11/19/2017   LIPASE 15 (L) 10/27/2014    Electrolytes Lab Results  Component Value Date   NA 137 08/31/2018   K 3.1 (L) 08/31/2018   CL 109 08/31/2018   CALCIUM 8.8 (L) 08/31/2018   MG 2.1 06/19/2018    Bone Lab Results  Component Value Date   VD25OH 23  (L) 02/14/2017    Inflammation (CRP: Acute Phase) (ESR: Chronic Phase) No results found for: CRP, ESRSEDRATE, LATICACIDVEN    Note: Above Lab results reviewed.   Assessment  The primary encounter diagnosis was Sensory polyneuropathy. Diagnoses of Paresthesia of both feet, Chronic radicular lumbar pain, and Chronic pain syndrome were also pertinent to this visit.  Plan of Care   Ms. Kara West has a current medication list which includes the following long-term medication(s): albuterol, amitriptyline, duloxetine, ferrous sulfate, rizatriptan, topiramate, and gabapentin.  Pharmacotherapy (Medications Ordered): Meds ordered this encounter  Medications  . Buprenorphine HCl (BELBUCA) 150 MCG FILM    Sig: Place 1 Film inside cheek every 12 (twelve) hours.    Dispense:  60 each    Refill:  0    For chronic pain syndrome related to sensory polyneuropathy   Follow-up plan:   Return in about 4 weeks (around 10/01/2019) for Medication Management, in person (M/W afternoon).     Status post right L4/5 ESI #1 on 05/01/2019: 8 cc injected, #2 06/26/19: 9 cc injected- not effective.  Status post lidocaine infusion: Not effective.  On amitriptyline, Cymbalta, gabapentin.  Consider belbuca as above.  Future considerations include spinal cord stimulator trial.      Recent Visits Date Type Provider Dept  08/07/19 Procedure visit Gillis Santa, [facility]  07/31/19 Office Visit Gillis Santa, MD [facility]  07/09/19 Office Visit Gillis Santa, MD [facility]  06/26/19 Procedure visit Gillis Santa, MD [facility]  Showing recent visits within past 90 days  and meeting all other requirements   Today's Visits Date Type Provider Dept  09/03/19 Office Visit Gillis Santa, MD [facility]  Showing today's visits and meeting all other requirements   Future Appointments No visits were found meeting these conditions.  Showing future appointments  within next 90 days and meeting all other requirements   I discussed the assessment and treatment plan with the patient. The patient was provided an opportunity to ask questions and all were answered. The patient agreed with the plan and demonstrated an understanding of the instructions.  Patient advised to call back or seek an in-person evaluation if the symptoms or condition worsens.  Duration of encounter: 25 minutes.  Note by: Gillis Santa, MD Date: 09/03/2019; Time: 3:06 PM

## 2019-10-02 ENCOUNTER — Ambulatory Visit
Admission: RE | Admit: 2019-10-02 | Discharge: 2019-10-02 | Disposition: A | Discharge status: Home / Self Care | Payer: BC Managed Care – PPO | Source: Ambulatory Visit | Attending: Student in an Organized Health Care Education/Training Program | Admitting: Student in an Organized Health Care Education/Training Program

## 2019-10-02 ENCOUNTER — Other Ambulatory Visit: Payer: Self-pay

## 2019-10-02 ENCOUNTER — Ambulatory Visit (HOSPITAL_BASED_OUTPATIENT_CLINIC_OR_DEPARTMENT_OTHER): Payer: BC Managed Care – PPO | Admitting: Student in an Organized Health Care Education/Training Program

## 2019-10-02 ENCOUNTER — Encounter: Payer: Self-pay | Admitting: Student in an Organized Health Care Education/Training Program

## 2019-10-02 VITALS — BP 114/78 | HR 81 | Temp 97.7°F | Resp 14 | Ht 69.0 in | Wt 225.0 lb

## 2019-10-02 DIAGNOSIS — G894 Chronic pain syndrome: Secondary | ICD-10-CM | POA: Diagnosis not present

## 2019-10-02 DIAGNOSIS — M5416 Radiculopathy, lumbar region: Secondary | ICD-10-CM | POA: Insufficient documentation

## 2019-10-02 DIAGNOSIS — G8929 Other chronic pain: Secondary | ICD-10-CM

## 2019-10-02 DIAGNOSIS — G608 Other hereditary and idiopathic neuropathies: Secondary | ICD-10-CM | POA: Diagnosis present

## 2019-10-02 DIAGNOSIS — R202 Paresthesia of skin: Secondary | ICD-10-CM

## 2019-10-02 MED ORDER — BELBUCA 450 MCG BU FILM: 1.0000 | ORAL_FILM | Freq: Two times a day (BID) | BUCCAL | 0 refills | Status: AC

## 2019-10-02 MED ORDER — AMITRIPTYLINE HCL 50 MG PO TABS: 75.0000 mg | ORAL_TABLET | Freq: Every day | ORAL | 2 refills | Status: DC

## 2019-10-02 NOTE — Patient Instructions (Addendum)
Prescriptions for Amitriptylline and Buprenorphine have been sent to your pharmacy.

## 2019-10-02 NOTE — Progress Notes (Signed)
Nursing Pain Medication Assessment:  Safety precautions to be maintained throughout the outpatient stay will include: orient to surroundings, keep bed in low position, maintain call bell within reach at all times, provide assistance with transfer out of bed and ambulation.  Medication Inspection Compliance: Pill count conducted under aseptic conditions, in front of the patient. Neither the pills nor the bottle was removed from the patient's sight at any time. Once count was completed pills were immediately returned to the patient in their original bottle.  Medication: Buprenorphine (Suboxone) Pill/Patch Count: 2 of 60 pills remain Pill/Patch Appearance: Markings consistent with prescribed medication Bottle Appearance: Standard pharmacy container. Clearly labeled.  : Last Medication intake:  Today

## 2019-10-02 NOTE — Progress Notes (Signed)
PROVIDER NOTE: Information contained herein reflects review and annotations entered in association with encounter. Interpretation of such information and data should be left to medically-trained personnel. Information provided to patient can be located elsewhere in the medical record under "Patient Instructions". Document created using STT-dictation technology, any transcriptional errors that may result from process are unintentional.    Patient: Kara West  Service Category: E/M  Provider: Gillis Santa, MD  DOB: September 14, 1975  DOS: 10/02/2019  Referring Provider: Donnie Coffin, MD  MRN: [id number]  Setting: Ambulatory outpatient  PCP: Donnie Coffin, MD  Type: Established Patient  Specialty: Interventional Pain Management    Location: Office  Delivery: Face-to-face     Primary Reason(s) for Visit: Encounter for prescription drug management. (Level of risk: moderate)  CC: Foot Pain (bilateral)  HPI  Kara West is a 44 y.o. year old, female patient, who comes today for a medication management evaluation. She has COPD exacerbation ([place]); Moderate Persistent Asthma; Allergic rhinitis; Paresthesia of both feet; Bilateral lower extremity edema; Bilateral cellulitis of lower leg; Menorrhagia with regular cycle; Leg pain; Lymphedema; Bilateral leg pain; Sensory polyneuropathy; Chronic pain syndrome; and Chronic radicular lumbar pain on their problem list. Her primarily concern today is the Foot Pain (bilateral)  Pain Assessment: Location: Right, Left Foot Radiating: both lower legs Onset: More than a month ago Duration: Chronic pain Quality: Throbbing Severity: 10-Worst pain ever/10 (subjective, self-reported pain score)  Effect on ADL:  Limits ability to stand up for extended period of time due to burning in her feet Timing: Constant Modifying factors: nothing BP: 114/78  HR: 81  Kara West was last scheduled for an appointment on 09/03/2019 for medication management. During today's  appointment we reviewed [name]'s chronic pain status, as well as her outpatient medication regimen.  Patient follows up today for medication management.  She states that she is having difficulty managing her pain on her current regimen.  She complains of paresthesias: Severe burning and ripping pain in her feet.  She has EMG proven sensory polyneuropathy.  She was started on belbuca at her last visit at 150 mcg twice daily.  She states that this is not effective.  She is currently on amitriptyline 50 mg, Cymbalta 60 mg daily, gabapentin 600 mg 3 times daily, Mobic, Topamax.  Patient is on various membrane stabilizers neuropathic's to help with her pain which are not that effective.  She states that she has a hard time standing up at work due to the burning pain in her feet.  She states that she often becomes tearful at work.  The patient  reports no history of drug use. Her body mass index is 33.23 kg/m.  Further details on both, my assessment(s), as well as the proposed treatment plan, please see below.  Controlled Substance Pharmacotherapy Assessment REMS (Risk Evaluation and Mitigation Strategy)  Analgesic: Belbuca 150 mcg twice daily.  Will increase to 450 mcg twice daily.  Kara Martins, RN  10/02/2019  1:26 PM  Sign when Signing Visit Nursing Pain Medication Assessment:  Safety precautions to be maintained throughout the outpatient stay will include: orient to surroundings, keep bed in low position, maintain call bell within reach at all times, provide assistance with transfer out of bed and ambulation.  Medication Inspection Compliance: Pill count conducted under aseptic conditions, in front of the patient. Neither the pills nor the bottle was removed from the patient's sight at any time. Once count was completed pills were immediately returned to the patient in  their original bottle.  Medication: Buprenorphine (Suboxone) Pill/Patch Count: 2 of 60 pills remain Pill/Patch  Appearance: Markings consistent with prescribed medication Bottle Appearance: Standard pharmacy container. Clearly labeled.  : Last Medication intake:  Today   Pharmacokinetics: Liberation and absorption (onset of action): WNL Distribution (time to peak effect): WNL Metabolism and excretion (duration of action): WNL         Pharmacodynamics: Desired effects: Analgesia: Kara West reports <50% benefit. Functional ability: Patient reports that medication allows her to accomplish basic ADLs Clinically meaningful improvement in function (CMIF): Sustained CMIF goals met Perceived effectiveness: Described as ineffective and would like to make some changes Undesirable effects: Side-effects or Adverse reactions: None reported Monitoring: [place] PMP: PDMP reviewed during this encounter. Online review of the past 5-monthperiod conducted. Compliant with practice rules and regulations Last UDS on record: No results found for: SUMMARY UDS interpretation: Compliant          Medication Assessment Form: Reviewed. Patient indicates being compliant with therapy Treatment compliance: Compliant Risk Assessment Profile: Aberrant behavior: See initial evaluations. None observed or detected today Comorbid factors increasing risk of overdose: See initial evaluation. No additional risks detected today Opioid risk tool (ORT):  Opioid Risk  08/07/2019  Alcohol 0  Illegal Drugs 0  Rx Drugs 0  Alcohol 0  Illegal Drugs 0  Rx Drugs 0  Age between 16-45 years  -  History of Preadolescent Sexual Abuse -  Psychological Disease 0  Depression 0  Opioid Risk Tool Scoring 0  Opioid Risk Interpretation Low Risk    ORT Scoring interpretation table:  Score <3 = Low Risk for SUD  Score between 4-7 = Moderate Risk for SUD  Score >8 = High Risk for Opioid Abuse   Risk of substance use disorder (SUD): Low  Risk Mitigation Strategies:  Patient Counseling: Covered Patient-Prescriber Agreement (PPA): Present and  active  Notification to other healthcare providers: Done  Pharmacologic Plan: Increase belbuca 450 mcg twice daily             Laboratory Chemistry Profile   Renal Lab Results  Component Value Date   BUN 17 08/31/2018   CREATININE 0.64 08/31/2018   GFRAA >60 08/31/2018   GFRNONAA >60 08/31/2018   PROTEINUR NEGATIVE 11/19/2017     Electrolytes Lab Results  Component Value Date   NA 137 08/31/2018   K 3.1 (L) 08/31/2018   CL 109 08/31/2018   CALCIUM 8.8 (L) 08/31/2018   MG 2.1 06/19/2018     Hepatic Lab Results  Component Value Date   AST 17 11/19/2017   ALT 13 (L) 11/19/2017   ALBUMIN 3.9 11/19/2017   ALKPHOS 63 11/19/2017   LIPASE 15 (L) 10/27/2014     ID Lab Results  Component Value Date   PREGTESTUR NEGATIVE 11/19/2017     Bone Lab Results  Component Value Date   VD25OH 23 (L) 02/14/2017     Endocrine Lab Results  Component Value Date   GLUCOSE 130 (H) 08/31/2018   GLUCOSEU NEGATIVE 11/19/2017   HGBA1C 5.0 02/14/2017   TSH 1.34 02/14/2017     Neuropathy Lab Results  Component Value Date   VITAMINB12 544 02/14/2017   FOLATE 8.2 02/14/2017   HGBA1C 5.0 02/14/2017     CNS No results found for: COLORCSF, APPEARCSF, RBCCOUNTCSF, WBCCSF, POLYSCSF, LYMPHSCSF, EOSCSF, PROTEINCSF, GLUCCSF, JCVIRUS, CSFOLI, IGGCSF, LABACHR, ACETBL, LABACHR, ACETBL   Inflammation (CRP: Acute  ESR: Chronic) No results found for: CRP, ESRSEDRATE, LATICACIDVEN   Rheumatology Lab Results  Component Value Date   ANA POS (A) 02/14/2017     Coagulation Lab Results  Component Value Date   PLT 201 08/31/2018     Cardiovascular Lab Results  Component Value Date   BNP 41.0 11/19/2017   CKTOTAL 39 06/25/2016   CKMB < 0.5 (L) 06/16/2012   TROPONINI <0.03 08/31/2018   HGB 12.8 08/31/2018   HCT 38.9 08/31/2018     Screening Lab Results  Component Value Date   PREGTESTUR NEGATIVE 11/19/2017     Cancer No results found for: CEA, CA125, LABCA2   Allergens No  results found for: ALMOND, APPLE, ASPARAGUS, AVOCADO, BANANA, BARLEY, BASIL, BAYLEAF, GREENBEAN, LIMABEAN, WHITEBEAN, BEEFIGE, REDBEET, BLUEBERRY, BROCCOLI, CABBAGE, MELON, CARROT, CASEIN, CASHEWNUT, CAULIFLOWER, CELERY     Note: Lab results reviewed.   Recent Diagnostic Imaging Results  Fluoro (C-Arm) (<60 min) (No Report) Fluoro was used, but no Radiologist interpretation will be provided.  Please refer to "NOTES" tab for provider progress note.  Complexity Note: Imaging results reviewed. Results shared with Ms. Shere, using Layman's terms.                               Meds   Current Outpatient Medications:  .  albuterol (PROVENTIL HFA;VENTOLIN HFA) 108 (90 Base) MCG/ACT inhaler, Inhale 2 puffs into the lungs every 6 (six) hours as needed for wheezing or shortness of breath., Disp: 1 Inhaler, Rfl: 2 .  amitriptyline (ELAVIL) 50 MG tablet, Take 1.5 tablets (75 mg total) by mouth at bedtime., Disp: 30 tablet, Rfl: 2 .  diclofenac (VOLTAREN) 75 MG EC tablet, Take 75 mg by mouth 2 (two) times daily., Disp: , Rfl:  .  DULoxetine (CYMBALTA) 60 MG capsule, TAKE 1 CAPSULE BY MOUTH ONCE DAILY FOR MOOD, Disp: , Rfl:  .  ferrous sulfate 325 (65 FE) MG tablet, Take by mouth., Disp: , Rfl:  .  Fremanezumab-vfrm (AJOVY) 225 MG/1.5ML SOSY, Inject into the skin., Disp: , Rfl:  .  meloxicam (MOBIC) 15 MG tablet, Take 15 mg by mouth daily., Disp: , Rfl:  .  rizatriptan (MAXALT) 10 MG tablet, TAKE ONE TABLET BY MOUTH ONCE AS NEEDED FOR MIGRAINE. MAY TAKE A SECOND DOSE AFTER 2 HOURS IF NEEDED., Disp: , Rfl:  .  Spacer/Aero-Holding Chambers (AEROCHAMBER MV) inhaler, Use as instructed, Disp: 1 each, Rfl: 0 .  thiamine 100 MG tablet, Take 100 mg by mouth daily. , Disp: , Rfl:  .  topiramate (TOPAMAX) 50 MG tablet, TAKE 1 2 (ONE HALF) TABLET BY MOUTH NIGHTLY FOR 14 DAYS THEN INCREASE TO 1 TABLET ONCE DAILY AT NIGHT, Disp: , Rfl:  .  Buprenorphine HCl (BELBUCA) 450 MCG FILM, Place 1 Film (450 mcg total)  inside cheek every 12 (twelve) hours., Disp: 60 each, Rfl: 0 .  gabapentin (NEURONTIN) 600 MG tablet, Take 1 tablet (600 mg total) by mouth 3 (three) times daily., Disp: 90 tablet, Rfl: 2  ROS  Constitutional: Denies any fever or chills Gastrointestinal: No reported hemesis, hematochezia, vomiting, or acute GI distress Musculoskeletal: Denies any acute onset joint swelling, redness, loss of ROM, or weakness Neurological: No reported episodes of acute onset apraxia, aphasia, dysarthria, agnosia, amnesia, paralysis, loss of coordination, or loss of consciousness  Allergies  Ms. Pryce has No Known Allergies.  [place]  Drug: Ms. Behanna  reports no history of drug use. Alcohol:  reports current alcohol use. Tobacco:  reports that she quit smoking about 5 years ago. Her  smoking use included cigarettes. She has a 1.00 pack-year smoking history. She has never used smokeless tobacco. Medical:  has a past medical history of Anemia, Bronchitis, COPD (chronic obstructive pulmonary disease) ([place]), GERD (gastroesophageal reflux disease), and History of Papanicolaou smear of cervix (10/12/2011). Surgical: Ms. Brubeck  has a past surgical history that includes Cesarean section; Endometrial ablation (11/11/2011); Dilation and curettage of uterus (11/11/2011); and Tubal ligation (1997). Family: family history includes Diabetes in her mother; Hypertension in her mother.  Constitutional Exam  General appearance: Well nourished, well developed, and well hydrated. In no apparent acute distress Vitals:   10/02/19 1322  BP: 114/78  Pulse: 81  Resp: 14  Temp: 97.7 F (36.5 C)  TempSrc: Temporal  SpO2: 99%  Weight: 225 lb (102.1 kg)  Height: '5\' 9"'$  (1.753 m)   BMI Assessment: Estimated body mass index is 33.23 kg/m as calculated from the following:   Height as of this encounter: '5\' 9"'$  (1.753 m).   Weight as of this encounter: 225 lb (102.1 kg).  BMI interpretation table: BMI level Category Range  association with higher incidence of chronic pain  <18 kg/m2 Underweight   18.5-24.9 kg/m2 Ideal body weight   25-29.9 kg/m2 Overweight Increased incidence by 20%  30-34.9 kg/m2 Obese (Class I) Increased incidence by 68%  35-39.9 kg/m2 Severe obesity (Class II) Increased incidence by 136%  >40 kg/m2 Extreme obesity (Class III) Increased incidence by 254%   Patient's current BMI Ideal Body weight  Body mass index is 33.23 kg/m. Ideal body weight: 66.2 kg (145 lb 15.1 oz) Adjusted ideal body weight: 80.5 kg (177 lb 9.1 oz)   BMI Readings from Last 4 Encounters:  10/02/19 33.23 kg/m  08/14/19 35.88 kg/m  08/07/19 35.91 kg/m  06/26/19 31.75 kg/m   Wt Readings from Last 4 Encounters:  10/02/19 225 lb (102.1 kg)  08/14/19 243 lb (110.2 kg)  08/07/19 243 lb 3.2 oz (110.3 kg)  06/26/19 215 lb (97.5 kg)    Psych/Mental status: Alert, oriented x 3 (person, place, & time)       Eyes: PERLA Respiratory: No evidence of acute respiratory distress  Cervical Spine Exam  Skin & Axial Inspection: No masses, redness, edema, swelling, or associated skin lesions Alignment: Symmetrical Functional ROM: Unrestricted ROM      Stability: No instability detected Muscle Tone/Strength: Functionally intact. No obvious neuro-muscular anomalies detected. Sensory (Neurological): Unimpaired Palpation: No palpable anomalies              Upper Extremity (UE) Exam    Side: Right upper extremity  Side: Left upper extremity  Skin & Extremity Inspection: Skin color, temperature, and hair growth are WNL. No peripheral edema or cyanosis. No masses, redness, swelling, asymmetry, or associated skin lesions. No contractures.  Skin & Extremity Inspection: Skin color, temperature, and hair growth are WNL. No peripheral edema or cyanosis. No masses, redness, swelling, asymmetry, or associated skin lesions. No contractures.  Functional ROM: Unrestricted ROM          Functional ROM: Unrestricted ROM          Muscle  Tone/Strength: Functionally intact. No obvious neuro-muscular anomalies detected.  Muscle Tone/Strength: Functionally intact. No obvious neuro-muscular anomalies detected.  Sensory (Neurological): Unimpaired          Sensory (Neurological): Unimpaired          Palpation: No palpable anomalies              Palpation: No palpable anomalies  Provocative Test(s):  Phalen's test: deferred Tinel's test: deferred Apley's scratch test (touch opposite shoulder):  Action 1 (Across chest): deferred Action 2 (Overhead): deferred Action 3 (LB reach): deferred   Provocative Test(s):  Phalen's test: deferred Tinel's test: deferred Apley's scratch test (touch opposite shoulder):  Action 1 (Across chest): deferred Action 2 (Overhead): deferred Action 3 (LB reach): deferred    Thoracic Spine Area Exam  Skin & Axial Inspection: No masses, redness, or swelling Alignment: Symmetrical Functional ROM: Unrestricted ROM Stability: No instability detected Muscle Tone/Strength: Functionally intact. No obvious neuro-muscular anomalies detected. Sensory (Neurological): Unimpaired Muscle strength & Tone: No palpable anomalies  Lumbar Exam  Skin & Axial Inspection: No masses, redness, or swelling Alignment: Symmetrical Functional ROM: Unrestricted ROM       Stability: No instability detected Muscle Tone/Strength: Functionally intact. No obvious neuro-muscular anomalies detected. Sensory (Neurological): Unimpaired Palpation: No palpable anomalies       Provocative Tests: Hyperextension/rotation test: deferred today       Lumbar quadrant test (Kemp's test): deferred today       Lateral bending test: deferred today       Patrick's Maneuver: deferred today                   FABER* test: deferred today                   S-I anterior distraction/compression test: deferred today         S-I lateral compression test: deferred today         S-I Thigh-thrust test: deferred today         S-I  Gaenslen's test: deferred today         *(Flexion, ABduction and External Rotation)  Gait & Posture Assessment  Ambulation: Unassisted Gait: Relatively normal for age and body habitus Posture: WNL   Lower Extremity Exam    Side: Right lower extremity  Side: Left lower extremity  Stability: No instability observed          Stability: No instability observed          Skin & Extremity Inspection: Skin color, temperature, and hair growth are WNL. No peripheral edema or cyanosis. No masses, redness, swelling, asymmetry, or associated skin lesions. No contractures.  Skin & Extremity Inspection: Skin color, temperature, and hair growth are WNL. No peripheral edema or cyanosis. No masses, redness, swelling, asymmetry, or associated skin lesions. No contractures.  Functional ROM: Decreased ROM Foot          Functional ROM: Decreased ROM                  Muscle Tone/Strength: Functionally intact. No obvious neuro-muscular anomalies detected.  Muscle Tone/Strength: Functionally intact. No obvious neuro-muscular anomalies detected.  Sensory (Neurological): Neuropathic pain pattern      Allodynia, paresthesias present on foot  Sensory (Neurological): Neuropathic pain pattern      Allodynia and paresthesias present on foot  DTR: Patellar: deferred today Achilles: deferred today Plantar: deferred today  DTR: Patellar: deferred today Achilles: deferred today Plantar: deferred today  Palpation: No palpable anomalies  Palpation: No palpable anomalies   Assessment   Status Diagnosis  Worsening Persistent Worsening 1. Sensory polyneuropathy   2. Chronic pain syndrome   3. Paresthesia of both feet   4. Chronic radicular lumbar pain      I had extensive discussion with the patient regarding treatment options.  She is on various membrane stabilizer/neuropathic medications which are not effectively managing  her pain.  We discussed spinal cord stimulation again today.  I evaluated the patient's T12-L1  interlaminar window and even with her obesity, her windows are patent and appropriate for a potential spinal cord stimulator trial.  I provided the patient with resources regarding spinal cord stimulation and explained to her details of it along with risks and benefits.  Patient states that she will think about this more.  She will follow back up with me in 1 month regarding decision on pursuing spinal cord stimulation which she does she will need psychological evaluation.  Regards to medication management, recommend increasing Belbuca to 450 mcg twice daily and increasing amitriptyline to 75 mg nightly.  Continue all other medications as prescribed.  Follow-up in 4 weeks. Plan of Care  Pharmacotherapy (Medications Ordered): Meds ordered this encounter  Medications  . Buprenorphine HCl (BELBUCA) 450 MCG FILM    Sig: Place 1 Film (450 mcg total) inside cheek every 12 (twelve) hours.    Dispense:  60 each    Refill:  0  . amitriptyline (ELAVIL) 50 MG tablet    Sig: Take 1.5 tablets (75 mg total) by mouth at bedtime.    Dispense:  30 tablet    Refill:  2    Orders:  Orders Placed This Encounter  Procedures  . DG PAIN CLINIC C-ARM 1-60 MIN NO REPORT    Intraoperative interpretation by procedural physician at [facility].    Standing Status:   Standing    Number of Occurrences:   1    Order Specific Question:   Reason for exam:    Answer:   Assistance in needle guidance and placement for procedures requiring needle placement in or near specific anatomical locations not easily accessible without such assistance.    Imaging Orders     DG PAIN CLINIC C-ARM 1-60 MIN NO REPORT Planned follow-up:   Return in about 4 weeks (around 10/30/2019) for Medication Management, in person.     Status post right L4/5 ESI #1 on 05/01/2019: 8 cc injected, #2 06/26/19: 9 cc injected- not effective.  Status post lidocaine infusion: Not effective.  On amitriptyline, Cymbalta, gabapentin.  Consider  belbuca as above.  Future considerations include spinal cord stimulator trial.       Recent Visits Date Type Provider Dept  09/03/19 Office Visit Gillis Santa, MD [facility]  08/07/19 Procedure visit Gillis Santa, MD [facility]  07/31/19 Office Visit Gillis Santa, MD [facility]  07/09/19 Office Visit Gillis Santa, MD [facility]  Showing recent visits within past 90 days and meeting all other requirements   Today's Visits Date Type Provider Dept  10/02/19 Office Visit Gillis Santa, MD [facility]  Showing today's visits and meeting all other requirements   Future Appointments Date Type Provider Dept  10/23/19 Appointment Gillis Santa, MD [facility]  Showing future appointments within next 90 days and meeting all other requirements   Primary Care Physician: Donnie Coffin, MD Location: [facility] Note by: Gillis Santa, MD Date: 10/02/2019; Time: 2:06 PM  Note: This dictation was prepared with Dragon dictation. Any transcriptional errors that may result from this process are unintentional.

## 2019-10-23 ENCOUNTER — Ambulatory Visit
Admission: RE | Admit: 2019-10-23 | Discharge: 2019-10-23 | Disposition: A | Discharge status: Home / Self Care | Payer: BC Managed Care – PPO | Source: Ambulatory Visit | Attending: Student in an Organized Health Care Education/Training Program | Admitting: Student in an Organized Health Care Education/Training Program

## 2019-10-23 ENCOUNTER — Encounter: Payer: Self-pay | Admitting: Student in an Organized Health Care Education/Training Program

## 2019-10-23 ENCOUNTER — Ambulatory Visit (HOSPITAL_BASED_OUTPATIENT_CLINIC_OR_DEPARTMENT_OTHER): Payer: BC Managed Care – PPO | Admitting: Student in an Organized Health Care Education/Training Program

## 2019-10-23 ENCOUNTER — Other Ambulatory Visit: Payer: Self-pay | Admitting: Student in an Organized Health Care Education/Training Program

## 2019-10-23 ENCOUNTER — Other Ambulatory Visit: Payer: Self-pay

## 2019-10-23 VITALS — BP 112/83 | HR 87 | Temp 97.1°F | Resp 18 | Ht 69.0 in | Wt 225.0 lb

## 2019-10-23 DIAGNOSIS — G894 Chronic pain syndrome: Secondary | ICD-10-CM

## 2019-10-23 DIAGNOSIS — G608 Other hereditary and idiopathic neuropathies: Secondary | ICD-10-CM | POA: Insufficient documentation

## 2019-10-23 DIAGNOSIS — M792 Neuralgia and neuritis, unspecified: Secondary | ICD-10-CM | POA: Insufficient documentation

## 2019-10-23 DIAGNOSIS — R52 Pain, unspecified: Secondary | ICD-10-CM

## 2019-10-23 NOTE — Progress Notes (Signed)
Safety precautions to be maintained throughout the outpatient stay will include: orient to surroundings, keep bed in low position, maintain call bell within reach at all times, provide assistance with transfer out of bed and ambulation.  

## 2019-10-23 NOTE — Progress Notes (Signed)
PROVIDER NOTE: Information contained herein reflects review and annotations entered in association with encounter. Interpretation of such information and data should be left to medically-trained personnel. Information provided to patient can be located elsewhere in the medical record under "Patient Instructions". Document created using STT-dictation technology, any transcriptional errors that may result from process are unintentional.    Patient: Kara West  Service Category: E/M  Provider: Gillis Santa, MD  DOB: [birth year]  DOS: 10/23/2019  Referring Provider: Donnie Coffin, MD  MRN: [id number]  Setting: Ambulatory outpatient  PCP: Donnie Coffin, MD  Type: Established Patient  Specialty: Interventional Pain Management    Location: Office  Delivery: Face-to-face     Primary Reason(s) for Visit: Evaluation of chronic illnesses with exacerbation, or progression (Level of risk: moderate) CC: Foot Pain (bilateral)  HPI  Kara West is a 44 y.o. year old, female patient, who comes today for a follow-up evaluation. She has COPD exacerbation ([place]); Moderate Persistent Asthma; Allergic rhinitis; Paresthesia of both feet; Bilateral lower extremity edema; Bilateral cellulitis of lower leg; Menorrhagia with regular cycle; Leg pain; Lymphedema; Bilateral leg pain; Sensory polyneuropathy; Chronic pain syndrome; Chronic radicular lumbar pain; and Neuropathic pain of left lower extremity on their problem list. Kara West was last seen on 10/02/2019. Her primarily concern today is the Foot Pain (bilateral)  Pain Assessment: Location: Right, Left Foot Radiating: radiates up calf in the back Onset: More than a month ago Duration: Chronic pain Quality: Throbbing Severity: 10-Worst pain ever/10 (subjective, self-reported pain score)  Note: Reported level is compatible with observation.  Timing: Constant Modifying factors: denies BP: 112/83  HR: 87  Further details on both, my assessment(s), as  well as the proposed treatment plan, please see below.  Kara West is a very pleasant 44 year old female who presents today for follow-up endorsing bilateral foot pain that she describes as burning, stabbing, debilitating. She has had extensive work-up in including an EMG study which shows sensory polyneuropathy. She is status post transforaminal L4-L5 epidural steroid injection with Dr. Sharlet Salina and has completed interlaminar epidural steroid injection with me neither of which were helpful for her symptoms. She has also completed a lidocaine infusion to help with her lower extremity neuropathic pain which to was not effective. We did have a discussion regarding spinal cord stimulator trial for lower extremity neuropathic pain, sensory polyneuropathy. Patient states that she is on her feet for 10 to 12 hours a day while she is at work and by our 6 or 7 she is in severe pain and is crying at work and oftentimes has to leave early to return home so that she can rest. She has tried physical therapy in the past which was not effective. She is currently on amitriptyline 75 mg nightly, belbuca 450 mcg film every 12 hours, Cymbalta 60 mg, gabapentin 600 mg 3 times a day along with Mobic 15 mg daily and Topamax 100 mg daily which still are not providing her with effective pain relief. Given exhaustion of physical, medication and conservative therapies, recommend spinal cord stimulator trial. I was able to evaluate the patient's interlaminar windows today and they are patent enough to tolerate percutaneous access. Risks and benefits of spinal cord stimulation were discussed in great detail with the patient including a trial which I performed in the new potential permanent implant that would be done by Dr. Cari Caraway with neurosurgery. I will refer the patient to Dr. Lyman Speller for psychological evaluation which is customary for new patients being considered for SCS trial.  Of also provided the patient with antimicrobial cleaning  solution that she is to apply to her low back prior to her SCS trial date.   Laboratory Chemistry Profile   Renal Lab Results  Component Value Date   BUN 17 08/31/2018   CREATININE 0.64 08/31/2018   GFRAA >60 08/31/2018   GFRNONAA >60 08/31/2018   PROTEINUR NEGATIVE 11/19/2017     Electrolytes Lab Results  Component Value Date   NA 137 08/31/2018   K 3.1 (L) 08/31/2018   CL 109 08/31/2018   CALCIUM 8.8 (L) 08/31/2018   MG 2.1 06/19/2018     Hepatic Lab Results  Component Value Date   AST 17 11/19/2017   ALT 13 (L) 11/19/2017   ALBUMIN 3.9 11/19/2017   ALKPHOS 63 11/19/2017   LIPASE 15 (L) 10/27/2014     ID Lab Results  Component Value Date   PREGTESTUR NEGATIVE 11/19/2017     Bone Lab Results  Component Value Date   VD25OH 23 (L) 02/14/2017     Endocrine Lab Results  Component Value Date   GLUCOSE 130 (H) 08/31/2018   GLUCOSEU NEGATIVE 11/19/2017   HGBA1C 5.0 02/14/2017   TSH 1.34 02/14/2017     Neuropathy Lab Results  Component Value Date   VITAMINB12 544 02/14/2017   FOLATE 8.2 02/14/2017   HGBA1C 5.0 02/14/2017     CNS No results found for: COLORCSF, APPEARCSF, RBCCOUNTCSF, WBCCSF, POLYSCSF, LYMPHSCSF, EOSCSF, PROTEINCSF, GLUCCSF, JCVIRUS, CSFOLI, IGGCSF, LABACHR, ACETBL, LABACHR, ACETBL   Inflammation (CRP: Acute  ESR: Chronic) No results found for: CRP, ESRSEDRATE, LATICACIDVEN   Rheumatology Lab Results  Component Value Date   ANA POS (A) 02/14/2017     Coagulation Lab Results  Component Value Date   PLT 201 08/31/2018     Cardiovascular Lab Results  Component Value Date   BNP 41.0 11/19/2017   CKTOTAL 39 06/25/2016   CKMB < 0.5 (L) 06/16/2012   TROPONINI <0.03 08/31/2018   HGB 12.8 08/31/2018   HCT 38.9 08/31/2018     Screening Lab Results  Component Value Date   PREGTESTUR NEGATIVE 11/19/2017     Cancer No results found for: CEA, CA125, LABCA2   Allergens No results found for: ALMOND, APPLE, ASPARAGUS, AVOCADO,  BANANA, BARLEY, BASIL, BAYLEAF, GREENBEAN, LIMABEAN, WHITEBEAN, BEEFIGE, REDBEET, BLUEBERRY, BROCCOLI, CABBAGE, MELON, CARROT, CASEIN, CASHEWNUT, CAULIFLOWER, CELERY     Note: Lab results reviewed.   Imaging Review   Results for orders placed during the hospital encounter of 12/26/18  MR LUMBAR SPINE WO CONTRAST   Narrative CLINICAL DATA:  Initial evaluation for bilateral foot pain, numbness and tingling for 2 years, right worse than left.  EXAM: MRI LUMBAR SPINE WITHOUT CONTRAST  TECHNIQUE: Multiplanar, multisequence MR imaging of the lumbar spine was performed. No intravenous contrast was administered.  COMPARISON:  None.  FINDINGS: Segmentation: Standard. Lowest well-formed disc labeled the L5-S1 level.  Alignment: Physiologic with preservation of the normal lumbar lordosis.  Vertebrae: Vertebral body height maintained without evidence for acute or chronic fracture. Bone marrow signal intensity within normal limits. Benign hemangioma largely replaces the T12 vertebral body. No other discrete or worrisome osseous lesions. No abnormal marrow edema.  Conus medullaris and cauda equina: Conus extends to the L2 level. Conus and cauda equina appear normal.  Paraspinal and other soft tissues: Paraspinous soft tissues within normal limits. Subcentimeter T2 hyperintense simple right renal cyst noted. Visualized visceral structures otherwise unremarkable.  Disc levels:  L1-2:  Unremarkable.  L2-3: Negative interspace. Mild bilateral facet  hypertrophy. No stenosis or impingement.  L3-4: Negative interspace. Mild to moderate facet hypertrophy. No canal or foraminal stenosis.  L4-5: Mild diffuse disc bulge with disc desiccation. Disc bulging slightly asymmetric to the left. Mild facet and ligament flavum hypertrophy. No canal or lateral recess stenosis. Mild left foraminal narrowing, with the bulging disc closely approximating the exiting left L4 nerve root (series 6,  image 13). Right neural foramen remains patent.  L5-S1:  Unremarkable.  IMPRESSION: 1. Shallow left eccentric disc bulge at L4-5, closely approximating and potentially irritating the exiting left L4 nerve root. 2. Mild to moderate facet hypertrophy at L2-3 through L4-5, which could contribute to underlying back pain.   Electronically Signed   By: Jeannine Boga M.D.   On: 12/27/2018 05:07      Results for orders placed during the hospital encounter of 02/14/17  DG Foot Complete Right   Narrative CLINICAL DATA:  Burning tingling.  No injury .  EXAM: RIGHT FOOT COMPLETE - 3+ VIEW  COMPARISON:  No recent .  FINDINGS: Soft tissue structures are unremarkable. Mild degenerative changes first MTP joint. No acute bony or joint abnormality identified.  IMPRESSION: Mild degenerative changes first MTP joint, otherwise negative exam.   Electronically Signed   By: Marcello Moores  Register   On: 02/15/2017 07:59    Foot-L DG Complete:  Results for orders placed during the hospital encounter of 02/14/17  DG Foot Complete Left   Narrative CLINICAL DATA:  Burning tingling.  No injury.  EXAM: LEFT FOOT - COMPLETE 3+ VIEW  FINDINGS: Soft tissue structures are unremarkable. No acute bony or joint abnormality identified. Degenerative changes first metatarsophalangeal joint.  IMPRESSION: Degenerative changes first MTP joint.  No acute abnormality .   Electronically Signed   By: Marcello Moores  Register   On: 02/15/2017 07:58      Interlaminar windows appear appropriate for percutaneous access. Negative for facet arthrosis, heterotopic bone formation.  Complexity Note: Imaging results reviewed. Results shared with Ms. Maranto, using Layman's terms.                         Meds   Current Outpatient Medications:  .  albuterol (PROVENTIL HFA;VENTOLIN HFA) 108 (90 Base) MCG/ACT inhaler, Inhale 2 puffs into the lungs every 6 (six) hours as needed for wheezing or shortness of  breath., Disp: 1 Inhaler, Rfl: 2 .  amitriptyline (ELAVIL) 50 MG tablet, Take 1.5 tablets (75 mg total) by mouth at bedtime., Disp: 30 tablet, Rfl: 2 .  Buprenorphine HCl (BELBUCA) 450 MCG FILM, Place 1 Film (450 mcg total) inside cheek every 12 (twelve) hours., Disp: 60 each, Rfl: 0 .  diclofenac (VOLTAREN) 75 MG EC tablet, Take 75 mg by mouth 2 (two) times daily., Disp: , Rfl:  .  DULoxetine (CYMBALTA) 60 MG capsule, TAKE 1 CAPSULE BY MOUTH ONCE DAILY FOR MOOD, Disp: , Rfl:  .  ferrous sulfate 325 (65 FE) MG tablet, Take by mouth., Disp: , Rfl:  .  Fremanezumab-vfrm (AJOVY) 225 MG/1.5ML SOSY, Inject into the skin., Disp: , Rfl:  .  gabapentin (NEURONTIN) 600 MG tablet, Take 1 tablet (600 mg total) by mouth 3 (three) times daily., Disp: 90 tablet, Rfl: 2 .  meloxicam (MOBIC) 15 MG tablet, Take 15 mg by mouth daily., Disp: , Rfl:  .  rizatriptan (MAXALT) 10 MG tablet, TAKE ONE TABLET BY MOUTH ONCE AS NEEDED FOR MIGRAINE. MAY TAKE A SECOND DOSE AFTER 2 HOURS IF NEEDED., Disp: , Rfl:  .  Spacer/Aero-Holding Chambers (AEROCHAMBER MV) inhaler, Use as instructed, Disp: 1 each, Rfl: 0 .  thiamine 100 MG tablet, Take 100 mg by mouth daily. , Disp: , Rfl:  .  topiramate (TOPAMAX) 50 MG tablet, TAKE 1 2 (ONE HALF) TABLET BY MOUTH NIGHTLY FOR 14 DAYS THEN INCREASE TO 1 TABLET ONCE DAILY AT NIGHT, Disp: , Rfl:   ROS  Constitutional: Denies any fever or chills Gastrointestinal: No reported hemesis, hematochezia, vomiting, or acute GI distress Musculoskeletal: Denies any acute onset joint swelling, redness, loss of ROM, or weakness Neurological: No reported episodes of acute onset apraxia, aphasia, dysarthria, agnosia, amnesia, paralysis, loss of coordination, or loss of consciousness  Allergies  Ms. Greb has No Known Allergies.  [place]  Drug: Ms. Krouse  reports no history of drug use. Alcohol:  reports current alcohol use. Tobacco:  reports that she quit smoking about 5 years ago. Her smoking use  included cigarettes. She has a 1.00 pack-year smoking history. She has never used smokeless tobacco. Medical:  has a past medical history of Anemia, Bronchitis, COPD (chronic obstructive pulmonary disease) ([place]), GERD (gastroesophageal reflux disease), and History of Papanicolaou smear of cervix (10/12/2011). Surgical: Ms. Vasko  has a past surgical history that includes Cesarean section; Endometrial ablation (11/11/2011); Dilation and curettage of uterus (11/11/2011); and Tubal ligation (1997). Family: family history includes Diabetes in her mother; Hypertension in her mother.  Constitutional Exam  General appearance: Well nourished, well developed, and well hydrated. In no apparent acute distress Vitals:   10/23/19 1149  BP: 112/83  Pulse: 87  Resp: 18  Temp: (!) 97.1 F (36.2 C)  SpO2: 99%  Weight: 225 lb (102.1 kg)  Height: '5\' 9"'$  (1.753 m)   BMI Assessment: Estimated body mass index is 33.23 kg/m as calculated from the following:   Height as of this encounter: '5\' 9"'$  (1.753 m).   Weight as of this encounter: 225 lb (102.1 kg).  BMI interpretation table: BMI level Category Range association with higher incidence of chronic pain  <18 kg/m2 Underweight   18.5-24.9 kg/m2 Ideal body weight   25-29.9 kg/m2 Overweight Increased incidence by 20%  30-34.9 kg/m2 Obese (Class I) Increased incidence by 68%  35-39.9 kg/m2 Severe obesity (Class II) Increased incidence by 136%  >40 kg/m2 Extreme obesity (Class III) Increased incidence by 254%   Patient's current BMI Ideal Body weight  Body mass index is 33.23 kg/m. Ideal body weight: 66.2 kg (145 lb 15.1 oz) Adjusted ideal body weight: 80.5 kg (177 lb 9.1 oz)   BMI Readings from Last 4 Encounters:  10/23/19 33.23 kg/m  10/02/19 33.23 kg/m  08/14/19 35.88 kg/m  08/07/19 35.91 kg/m   Wt Readings from Last 4 Encounters:  10/23/19 225 lb (102.1 kg)  10/02/19 225 lb (102.1 kg)  08/14/19 243 lb (110.2 kg)  08/07/19 243 lb 3.2 oz  (110.3 kg)    Psych/Mental status: Alert, oriented x 3 (person, place, & time)       Eyes: PERLA Respiratory: No evidence of acute respiratory distress  Cervical Spine Exam  Skin & Axial Inspection: No masses, redness, edema, swelling, or associated skin lesions Alignment: Symmetrical Functional ROM: Unrestricted ROM      Stability: No instability detected Muscle Tone/Strength: Functionally intact. No obvious neuro-muscular anomalies detected. Sensory (Neurological): Unimpaired Palpation: No palpable anomalies              Upper Extremity (UE) Exam    Side: Right upper extremity  Side: Left upper extremity  Skin & Extremity  Inspection: Skin color, temperature, and hair growth are WNL. No peripheral edema or cyanosis. No masses, redness, swelling, asymmetry, or associated skin lesions. No contractures.  Skin & Extremity Inspection: Skin color, temperature, and hair growth are WNL. No peripheral edema or cyanosis. No masses, redness, swelling, asymmetry, or associated skin lesions. No contractures.  Functional ROM: Unrestricted ROM          Functional ROM: Unrestricted ROM          Muscle Tone/Strength: Functionally intact. No obvious neuro-muscular anomalies detected.  Muscle Tone/Strength: Functionally intact. No obvious neuro-muscular anomalies detected.  Sensory (Neurological): Unimpaired          Sensory (Neurological): Unimpaired          Palpation: No palpable anomalies              Palpation: No palpable anomalies              Provocative Test(s):  Phalen's test: deferred Tinel's test: deferred Apley's scratch test (touch opposite shoulder):  Action 1 (Across chest): deferred Action 2 (Overhead): deferred Action 3 (LB reach): deferred   Provocative Test(s):  Phalen's test: deferred Tinel's test: deferred Apley's scratch test (touch opposite shoulder):  Action 1 (Across chest): deferred Action 2 (Overhead): deferred Action 3 (LB reach): deferred    Thoracic Spine Area Exam   Skin & Axial Inspection: No masses, redness, or swelling Alignment: Symmetrical Functional ROM: Unrestricted ROM Stability: No instability detected Muscle Tone/Strength: Functionally intact. No obvious neuro-muscular anomalies detected. Sensory (Neurological): Unimpaired Muscle strength & Tone: No palpable anomalies  Lumbar Exam  Skin & Axial Inspection: No masses, redness, or swelling Alignment: Symmetrical Functional ROM: Unrestricted ROM       Stability: No instability detected Muscle Tone/Strength: Functionally intact. No obvious neuro-muscular anomalies detected. Sensory (Neurological): Unimpaired Palpation: No palpable anomalies       Provocative Tests: Hyperextension/rotation test: deferred today       Lumbar quadrant test (Kemp's test): deferred today       Lateral bending test: deferred today       Patrick's Maneuver: deferred today                   FABER* test: deferred today                   S-I anterior distraction/compression test: deferred today         S-I lateral compression test: deferred today         S-I Thigh-thrust test: deferred today         S-I Gaenslen's test: deferred today         *(Flexion, ABduction and External Rotation)  Gait & Posture Assessment  Ambulation: Unassisted Gait: Relatively normal for age and body habitus Posture: WNL   Lower Extremity Exam    Side: Right lower extremity  Side: Left lower extremity  Stability: No instability observed          Stability: No instability observed          Skin & Extremity Inspection: Skin color, temperature, and hair growth are WNL. No peripheral edema or cyanosis. No masses, redness, swelling, asymmetry, or associated skin lesions. No contractures.  Skin & Extremity Inspection: Skin color, temperature, and hair growth are WNL. No peripheral edema or cyanosis. No masses, redness, swelling, asymmetry, or associated skin lesions. No contractures.  Functional ROM: Pain restricted ROM  Functional ROM: Pain restricted ROM                  Muscle Tone/Strength: Functionally intact. No obvious neuro-muscular anomalies detected.  Muscle Tone/Strength: Functionally intact. No obvious neuro-muscular anomalies detected.  Sensory (Neurological): Neuropathic pain pattern        Sensory (Neurological): Neuropathic pain pattern        DTR: Patellar: 2+: normal Achilles: deferred today Plantar: deferred today  DTR: Patellar: 2+: normal Achilles: deferred today Plantar: deferred today  Palpation: No palpable anomalies  Palpation: No palpable anomalies   Assessment   Status Diagnosis  Persistent Persistent Persistent 1. Neuropathic pain of left lower extremity   2. Neuropathic pain of right lower extremity   3. Sensory polyneuropathy   4. Chronic pain syndrome      Updated Problems: Problem  Neuropathic Pain of Left Lower Extremity    Plan of Care   Kara West is a very pleasant 44 year old female who presents today for follow-up endorsing bilateral foot pain that she describes as burning, stabbing, debilitating. She has had extensive work-up in including an EMG study which shows sensory polyneuropathy. She is status post transforaminal L4-L5 epidural steroid injection with Dr. Sharlet Salina and has completed interlaminar epidural steroid injection with me neither of which were helpful for her symptoms. She has also completed a lidocaine infusion to help with her lower extremity neuropathic pain which to was not effective. We did have a discussion regarding spinal cord stimulator trial for lower extremity neuropathic pain, sensory polyneuropathy. Patient states that she is on her feet for 10 to 12 hours a day while she is at work and by our 6 or 7 she is in severe pain and is crying at work and oftentimes has to leave early to return home so that she can rest. She has tried physical therapy in the past which was not effective. She is currently on amitriptyline 75 mg nightly, belbuca 450 mcg  film every 12 hours, Cymbalta 60 mg, gabapentin 600 mg 3 times a day along with Mobic 15 mg daily and Topamax 100 mg daily which still are not providing her with effective pain relief. Given exhaustion of physical, medication and conservative therapies, recommend spinal cord stimulator trial. I was able to evaluate the patient's interlaminar windows today and they are patent enough to tolerate percutaneous access. Risks and benefits of spinal cord stimulation were discussed in great detail with the patient including a trial which I performed in the new potential permanent implant that would be done by Dr. Cari Caraway with neurosurgery. I will refer the patient to Dr. Lyman Speller for psychological evaluation which is customary for new patients being considered for SCS trial. Of also provided the patient with antimicrobial cleaning solution that she is to apply to her low back prior to her SCS trial date.  I discussed  percutaneous spinal cord stimulator trial with the patient in detail. I explained to the patient that they will have an external power source and programmer which the patient will use for 7 days. There will be daily communication with the stimulator company and the patient. A possible need for a mid-trial clinic visit to give the patient the best chance of success.   Patient will need to have a thorough psychosocial behavioral evaluation. Our office will be happy to help facilitate this. Will place referral to Dr Lyman Speller.  Patient is interested in proceeding with spinal cord stimulation trial. She understands that this may not be successful, and that spinal cord  stimulation in general is not a "magic bullet."   We had a lengthy and very detailed discussion of all the risks, benefits, alternatives, and rationale of surgery as well as the option of continuing nonsurgical therapies. We specifically discussed the risks of temporary or permanent worsened neurologic injury, no symptomatic relief or pain  made worse after procedure, and also the need for future surgery (due to infection, CSF leak, bleeding, adjacent segment issues, bone-healing difficulties, and other related issues). No guarantees of outcome were made or implied.  Ms. Kommer told me that all of her questions were answered thoroughly and to his satisfaction. Confidence and understanding of the discussed risks and consequences of  treatment was expressed and he accepted these risks and was eager to proceed with procedure pending insurance approval and psych assessment.   Issues concerning treatment and diagnosis were discussed with the patient. There are no barriers to understanding the plan of treatment. Explanation was well received by patient and/or family who then verbalized understanding.    Orders:  Orders Placed This Encounter  Procedures  . [employer] representative to notify them of the scheduled case and to make sure they will be available to provide required equipment.    Standing Status:   Future    Standing Expiration Date:   04/24/2020    Scheduling Instructions:     Side: Bilateral     Level: Lumbar     Device: [employer]     Sedation: With sedation     Timeframe: As soon as pre-approved    Order Specific Question:   Where will this procedure be performed?    Answer:   [facility]  . DG PAIN CLINIC C-ARM 1-60 MIN NO REPORT    Intraoperative interpretation by procedural physician at [facility].    Standing Status:   Standing    Number of Occurrences:   1    Order Specific Question:   Reason for exam:    Answer:   Assistance in needle guidance and placement for procedures requiring needle placement in or near specific anatomical locations not easily accessible without such assistance.  . Ambulatory referral to Psychology    Referral Priority:   Routine    Referral Type:   Psychiatric    Referral Reason:   Specialty Services Required    Referred  to Provider:   Renaee Munda, PhD    Requested Specialty:   Psychology    Number of Visits Requested:   1    Imaging Orders     DG PAIN CLINIC C-ARM 1-60 MIN NO REPORT  Referral Orders     Ambulatory referral to Psychology Planned follow-up:   Return in about 6 weeks (around 12/04/2019) for [facility] Trial.     Status post right L4/5 ESI #1 on 05/01/2019: 8 cc injected, #2 06/26/19: 9 cc injected- not effective.  Status post lidocaine infusion: Not effective.  On amitriptyline, Cymbalta, gabapentin.  Consider belbuca as above.  Future considerations include spinal cord stimulator trial.        Recent Visits Date Type Provider Dept  10/02/19 Office Visit Gillis Santa, MD [facility]  09/03/19 Office Visit Gillis Santa, MD [facility]  08/07/19 Procedure visit Gillis Santa, MD [facility]  07/31/19 Office Visit Gillis Santa, MD [facility]  Showing recent visits within past 90 days and meeting all other requirements   Today's Visits Date Type Provider Dept  10/23/19 Office Visit Gillis Santa, MD [facility]  Showing today's visits and meeting all other requirements   Future Appointments No visits were found meeting these conditions.  Showing future appointments within next 90 days and meeting all other requirements   Primary Care Physician: Donnie Coffin, MD Location: [facility] Note by: Gillis Santa, MD Date: 10/23/2019; Time: 3:03 PM  Note: This dictation was prepared with Dragon dictation. Any transcriptional errors that may result from this process are unintentional.

## 2019-10-23 NOTE — Patient Instructions (Signed)
Today we evaluated your spine under x-ray to see if you would be a candidate for spinal cord stimulator trial.  I will refer you to a psychologist.  After you have seen the psychologist, please contact our clinic to schedule your spinal cord stimulator trial.  Please be sure to wash your lower back with the antimicrobial soap that we are providing.

## 2019-11-01 IMAGING — CR DG CHEST 2V
1 series · 2 of 2 positions shown · non-contrast
Comparison: 09/06/2016

CLINICAL DATA: Productive cough

EXAM:
CHEST - 2 VIEW

[Series 1: dg chest 2 view · 0.14mm/px · 2 of 2 slices shown]
[im 1/2]
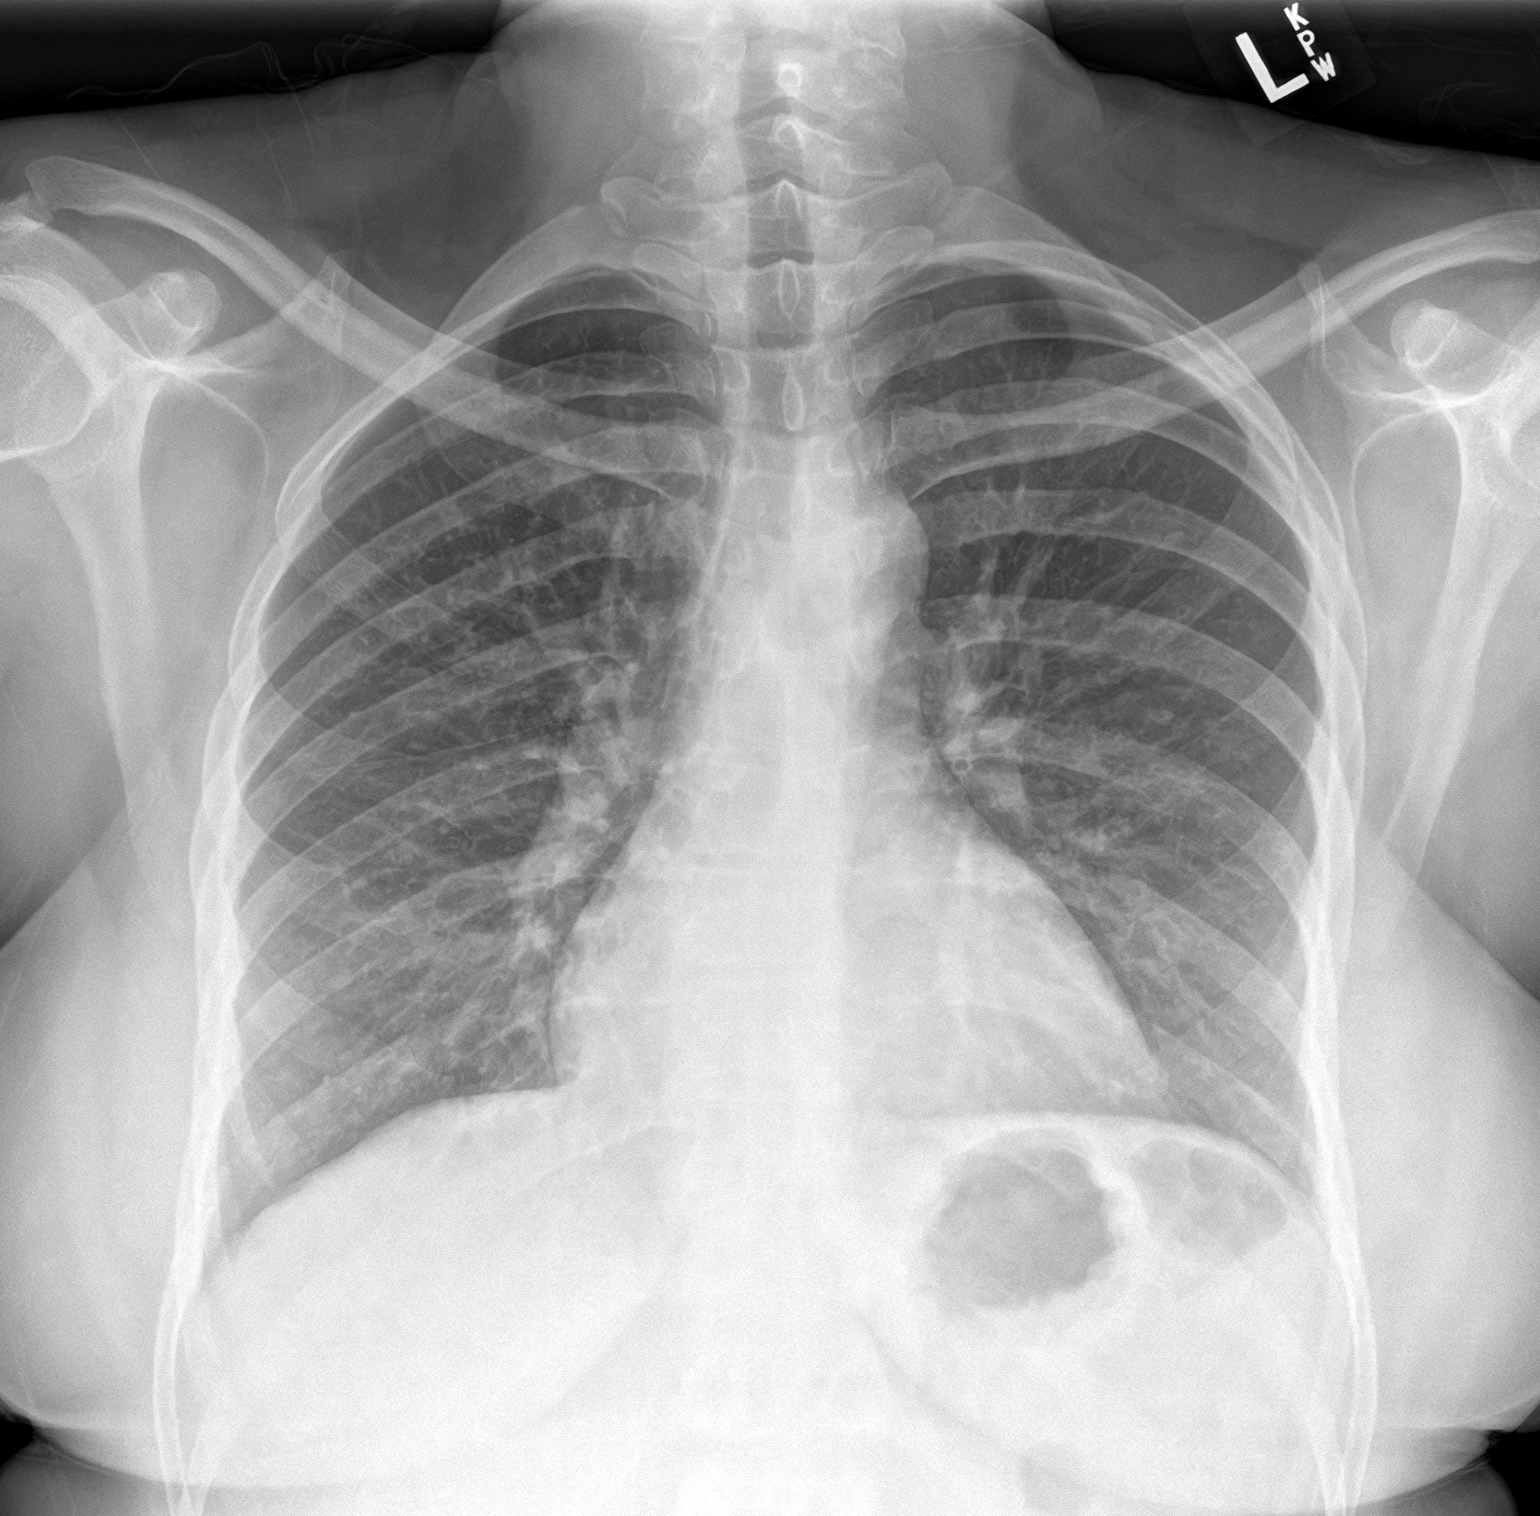
[im 2/2]
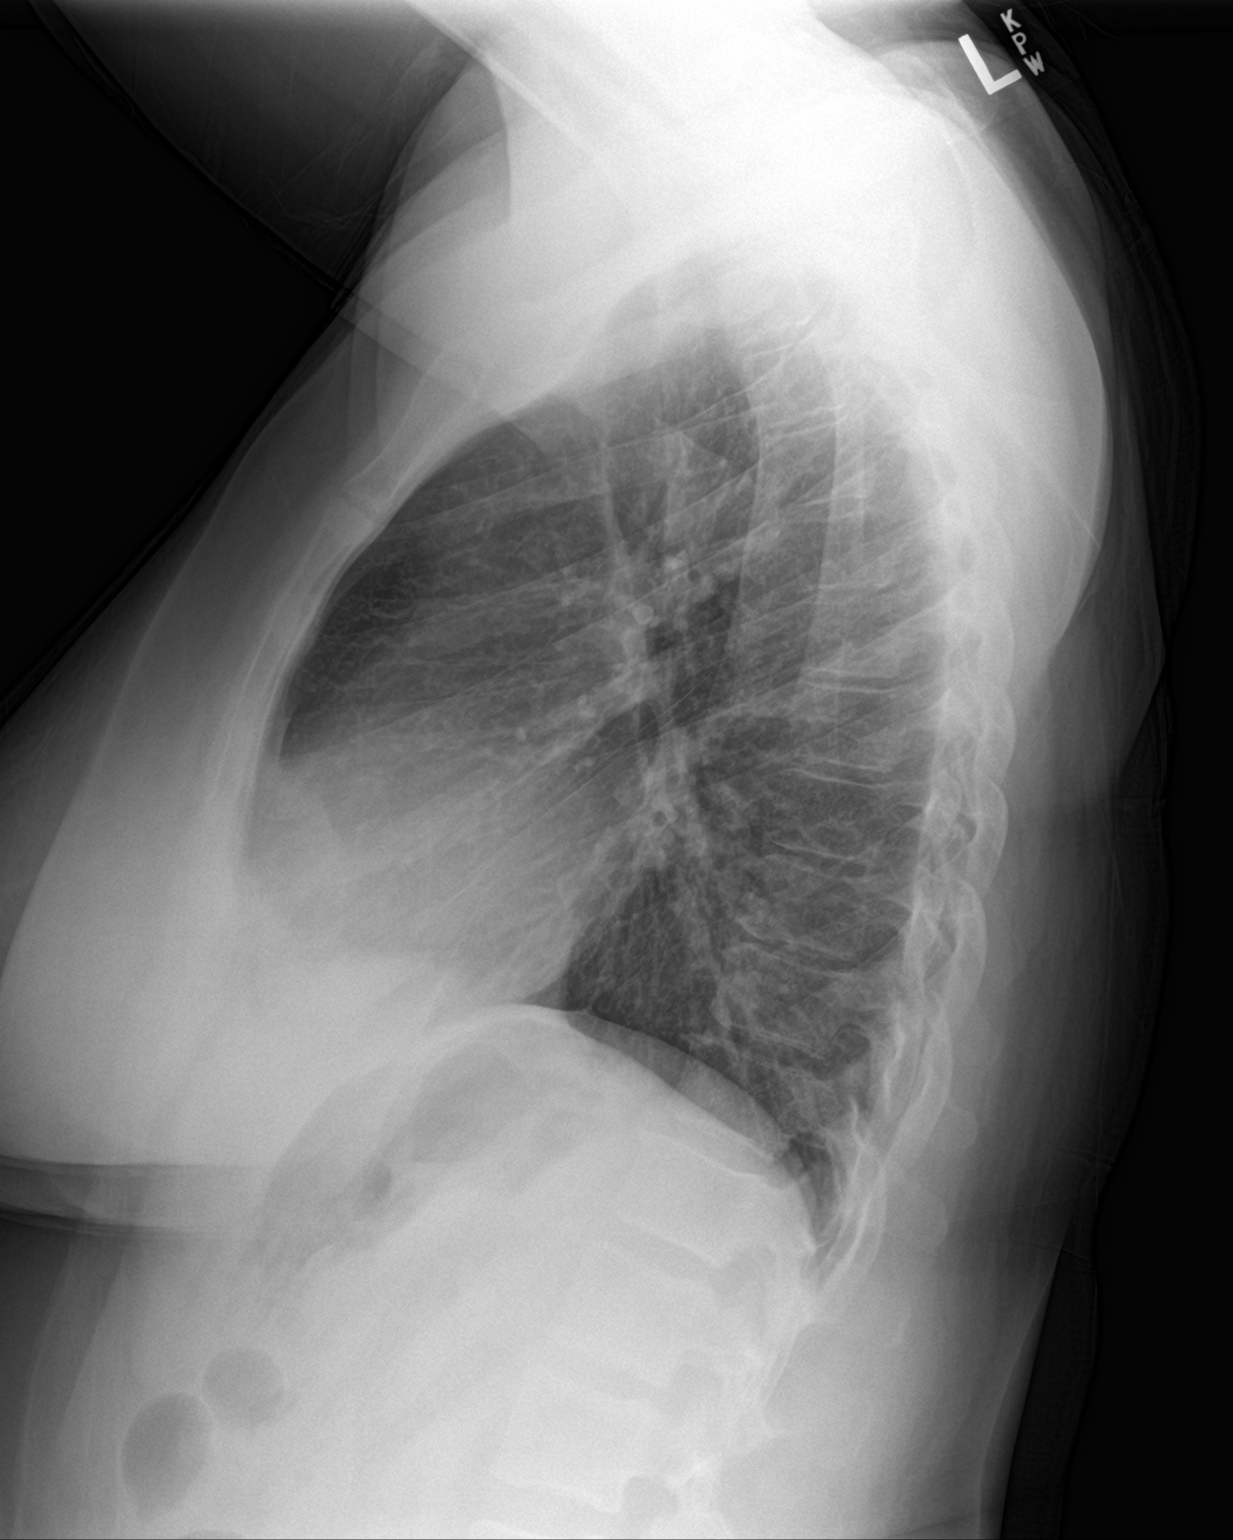

[2 of 2 positions shown; findings below may reference images not displayed]

FINDINGS: Normal heart size and mediastinal contours. No acute infiltrate or
edema. No effusion or pneumothorax. No acute osseous findings.
IMPRESSION: Negative chest.

## 2019-11-22 ENCOUNTER — Other Ambulatory Visit: Payer: Self-pay

## 2019-11-22 ENCOUNTER — Emergency Department: Payer: BC Managed Care – PPO

## 2019-11-22 DIAGNOSIS — Z87891 Personal history of nicotine dependence: Secondary | ICD-10-CM | POA: Insufficient documentation

## 2019-11-22 DIAGNOSIS — R0602 Shortness of breath: Secondary | ICD-10-CM | POA: Insufficient documentation

## 2019-11-22 DIAGNOSIS — Z79899 Other long term (current) drug therapy: Secondary | ICD-10-CM | POA: Diagnosis not present

## 2019-11-22 DIAGNOSIS — J441 Chronic obstructive pulmonary disease with (acute) exacerbation: Secondary | ICD-10-CM | POA: Insufficient documentation

## 2019-11-22 DIAGNOSIS — R05 Cough: Secondary | ICD-10-CM | POA: Diagnosis present

## 2019-11-22 DIAGNOSIS — J40 Bronchitis, not specified as acute or chronic: Secondary | ICD-10-CM | POA: Insufficient documentation

## 2019-11-22 LAB — CBC
HCT: 39.5 % (ref 36.0–46.0)
Hemoglobin: 13.1 g/dL (ref 12.0–15.0)
MCH: 28.4 pg (ref 26.0–34.0)
MCHC: 33.2 g/dL (ref 30.0–36.0)
MCV: 85.5 fL (ref 80.0–100.0)
Platelets: 219 10*3/uL (ref 150–400)
RBC: 4.62 MIL/uL (ref 3.87–5.11)
RDW: 14.7 % (ref 11.5–15.5)
WBC: 5 10*3/uL (ref 4.0–10.5)
nRBC: 0 % (ref 0.0–0.2)

## 2019-11-22 LAB — BASIC METABOLIC PANEL
Anion gap: 8 (ref 5–15)
BUN: 11 mg/dL (ref 6–20)
CO2: 25 mmol/L (ref 22–32)
Calcium: 9.2 mg/dL (ref 8.9–10.3)
Chloride: 107 mmol/L (ref 98–111)
Creatinine, Ser: 0.62 mg/dL (ref 0.44–1.00)
GFR calc Af Amer: >60 mL/min (ref >60)
GFR calc non Af Amer: >60 mL/min (ref >60)
Glucose, Bld: 88 mg/dL (ref 70–99)
Potassium: 3.3 mmol/L — ABNORMAL LOW (ref 3.5–5.1)
Sodium: 140 mmol/L (ref 135–145)

## 2019-11-22 LAB — TROPONIN I (HIGH SENSITIVITY): Troponin I (High Sensitivity): <2 ng/L (ref <18)

## 2019-11-22 MED ORDER — SODIUM CHLORIDE 0.9% FLUSH: 3.0000 mL | Freq: Once | INTRAVENOUS | Status: DC

## 2019-11-22 NOTE — ED Triage Notes (Signed)
Pt ambulatory from triage with visible shob after walking. Pt states "my bronchitis is acting up". PT reports cough, denies fever. Reports she is out of her inhaler and pieces to her nebulizer.

## 2019-11-23 ENCOUNTER — Emergency Department
Admission: EM | Admit: 2019-11-23 | Discharge: 2019-11-23 | Disposition: A | Discharge status: Home / Self Care | Payer: BC Managed Care – PPO | Attending: Emergency Medicine | Admitting: Emergency Medicine

## 2019-11-23 DIAGNOSIS — J40 Bronchitis, not specified as acute or chronic: Secondary | ICD-10-CM

## 2019-11-23 DIAGNOSIS — J441 Chronic obstructive pulmonary disease with (acute) exacerbation: Secondary | ICD-10-CM

## 2019-11-23 MED ORDER — ALBUTEROL SULFATE HFA 108 (90 BASE) MCG/ACT IN AERS
2.0000 | INHALATION_SPRAY | RESPIRATORY_TRACT | 0 refills | Status: DC | PRN
Start: 2019-11-23 — End: 2019-12-13

## 2019-11-23 MED ORDER — PREDNISONE 20 MG PO TABS: ORAL_TABLET | ORAL | 0 refills | Status: DC

## 2019-11-23 MED ORDER — HYDROCOD POLST-CPM POLST ER 10-8 MG/5ML PO SUER: 5.0000 mL | Freq: Two times a day (BID) | ORAL | 0 refills | Status: DC

## 2019-11-23 MED ORDER — PREDNISONE 20 MG PO TABS
60.0000 mg | ORAL_TABLET | Freq: Once | ORAL | Status: AC
  Administered 2019-11-23: 60 mg via ORAL
  Filled 2019-11-23: qty 3

## 2019-11-23 MED ORDER — HYDROCOD POLST-CPM POLST ER 10-8 MG/5ML PO SUER
5.0000 mL | Freq: Once | ORAL | Status: AC
  Administered 2019-11-23: 5 mL via ORAL
  Filled 2019-11-23: qty 5

## 2019-11-23 MED ORDER — IPRATROPIUM-ALBUTEROL 0.5-2.5 (3) MG/3ML IN SOLN
3.0000 mL | Freq: Once | RESPIRATORY_TRACT | Status: AC
  Administered 2019-11-23: 3 mL via RESPIRATORY_TRACT
  Filled 2019-11-23: qty 3

## 2019-11-23 NOTE — Discharge Instructions (Addendum)
1.  Finish Prednisone 60 mg daily x4 days.  Start your next dose Sunday morning. 2.  Take Tussionex as needed for cough. 3.  Use your Albuterol inhaler and/or nebulizer every 4 hours as needed for difficulty breathing. 4.  Return to the ER for worsening symptoms, persistent vomiting, difficulty breathing or other concerns.

## 2019-11-23 NOTE — ED Provider Notes (Signed)
[facility] Emergency Department Provider Note   ____________________________________________   First MD Initiated Contact with Patient 11/23/19 [phone]     (approximate)  I have reviewed the triage vital signs and the nursing notes.   HISTORY  Chief Complaint Shortness of Breath    HPI Kara West is a 44 y.o. female who presents to the ED from home with a chief complaint of cough and shortness of breath.  Patient with a history of COPD who states her "bronchitis is acting up".  Reports nonproductive cough, wheezing and shortness of breath x4 days.  Denies fever, chills, chest pain, abdominal pain, nausea, vomiting or diarrhea.  Denies recent travel, trauma or hormone use.  Denies known COVID-19 exposure.       Past Medical History:  Diagnosis Date  . Anemia    s/p transfusion  . Bronchitis   . COPD (chronic obstructive pulmonary disease) ([place])   . GERD (gastroesophageal reflux disease)   . History of Papanicolaou smear of cervix 10/12/2011   neg    Patient Active Problem List   Diagnosis Date Noted  . Neuropathic pain of left lower extremity 10/23/2019  . Chronic pain syndrome 04/16/2019  . Chronic radicular lumbar pain 04/16/2019  . Bilateral leg pain 11/14/2018  . Leg pain 07/11/2018  . Lymphedema 07/11/2018  . Menorrhagia with regular cycle 03/06/2018  . Bilateral lower extremity edema 12/18/2017  . Bilateral cellulitis of lower leg 12/18/2017  . Sensory polyneuropathy 08/07/2017  . Paresthesia of both feet 02/16/2017  . Moderate Persistent Asthma 02/18/2015  . Allergic rhinitis 02/18/2015  . COPD exacerbation ([place]) 12/01/2014    Past Surgical History:  Procedure Laterality Date  . CESAREAN SECTION     x3  . DILATION AND CURETTAGE OF UTERUS  11/11/2011   hysteroscopy  . ENDOMETRIAL ABLATION  11/11/2011  . TUBAL LIGATION  1997    Prior to Admission medications   Medication Sig Start Date End Date Taking? Authorizing  Provider  albuterol (VENTOLIN HFA) 108 (90 Base) MCG/ACT inhaler Inhale 2 puffs into the lungs every 4 (four) hours as needed for wheezing or shortness of breath. 11/23/19   Paulette Blanch, MD  amitriptyline (ELAVIL) 50 MG tablet Take 1.5 tablets (75 mg total) by mouth at bedtime. 10/02/19   Gillis Santa, MD  chlorpheniramine-HYDROcodone (TUSSIONEX PENNKINETIC ER) 10-8 MG/5ML SUER Take 5 mLs by mouth 2 (two) times daily. 11/23/19   Paulette Blanch, MD  diclofenac (VOLTAREN) 75 MG EC tablet Take 75 mg by mouth 2 (two) times daily.    [provider]  DULoxetine (CYMBALTA) 60 MG capsule TAKE 1 CAPSULE BY MOUTH ONCE DAILY FOR MOOD 11/09/18   [provider]  ferrous sulfate 325 (65 FE) MG tablet Take by mouth.    [provider]  Fremanezumab-vfrm (AJOVY) 225 MG/1.5ML SOSY Inject into the skin. 12/07/18   [provider]  gabapentin (NEURONTIN) 600 MG tablet Take 1 tablet (600 mg total) by mouth 3 (three) times daily. 04/16/19 10/23/19  Gillis Santa, MD  meloxicam (MOBIC) 15 MG tablet Take 15 mg by mouth daily. 07/04/19   [provider]  predniSONE (DELTASONE) 20 MG tablet 3 tablets daily x 4 days 11/23/19   Paulette Blanch, MD  rizatriptan (MAXALT) 10 MG tablet TAKE ONE TABLET BY MOUTH ONCE AS NEEDED FOR MIGRAINE. MAY TAKE A SECOND DOSE AFTER 2 HOURS IF NEEDED. 12/09/18   [provider]  Spacer/Aero-Holding Chambers (AEROCHAMBER MV) inhaler Use as instructed 02/18/15  Juanito Doom, MD  thiamine 100 MG tablet Take 100 mg by mouth daily.     [provider]  topiramate (TOPAMAX) 50 MG tablet TAKE 1 2 (ONE HALF) TABLET BY MOUTH NIGHTLY FOR 14 DAYS THEN INCREASE TO 1 TABLET ONCE DAILY AT NIGHT 01/10/19   [provider]    Allergies Patient has no known allergies.  Family History  Problem Relation Age of Onset  . Hypertension Mother   . Diabetes Mother     Social History Social History   Tobacco Use  . Smoking status: Former Smoker      Packs/day: 0.20    Years: 5.00    Pack years: 1.00    Types: Cigarettes    Quit date: 06/11/2014    Years since quitting: 5.4  . Smokeless tobacco: Never Used  Substance Use Topics  . Alcohol use: Yes    Alcohol/week: 0.0 standard drinks  . Drug use: No    Review of Systems  Constitutional: No fever/chills Eyes: No visual changes. ENT: No sore throat. Cardiovascular: Denies chest pain. Respiratory: Positive for nonproductive cough, wheezing and shortness of breath. Gastrointestinal: No abdominal pain.  No nausea, no vomiting.  No diarrhea.  No constipation. Genitourinary: Negative for dysuria. Musculoskeletal: Negative for back pain. Skin: Negative for rash. Neurological: Negative for headaches, focal weakness or numbness.   ____________________________________________   PHYSICAL EXAM:  VITAL SIGNS: ED Triage Vitals  Enc Vitals Group     BP 11/22/19 2243 (!) 118/95     Pulse Rate 11/22/19 2243 86     Resp 11/22/19 2243 20     Temp 11/22/19 2243 98.7 F (37.1 C)     Temp Source 11/22/19 2243 Oral     SpO2 11/22/19 2243 100 %     Weight 11/22/19 2244 225 lb (102.1 kg)     Height 11/22/19 2244 5\' 9"  (1.753 m)     Head Circumference --      Peak Flow --      Pain Score 11/22/19 2244 0     Pain Loc --      Pain Edu? --      Excl. in [place]? --     Constitutional: Alert and oriented. Well appearing and in mild acute distress. Eyes: Conjunctivae are normal. PERRL. EOMI. Head: Atraumatic. Nose: No congestion/rhinnorhea. Mouth/Throat: Mucous membranes are moist.   Neck: No stridor.   Cardiovascular: Normal rate, regular rhythm. Grossly normal heart sounds.  Good peripheral circulation. Respiratory: Normal respiratory effort.  No retractions. Lungs diminished with slight wheezing.  Active dry cough noted. Gastrointestinal: Soft and nontender. No distention. No abdominal bruits. No CVA tenderness. Musculoskeletal: No lower extremity tenderness nor edema.  No joint  effusions. Neurologic:  Normal speech and language. No gross focal neurologic deficits are appreciated. No gait instability. Skin:  Skin is warm, dry and intact. No rash noted. Psychiatric: Mood and affect are normal. Speech and behavior are normal.  ____________________________________________   LABS (all labs ordered are listed, but only abnormal results are displayed)  Labs Reviewed  BASIC METABOLIC PANEL - Abnormal; Notable for the following components:      Result Value   Potassium 3.3 (*)    All other components within normal limits  CBC  TROPONIN I (HIGH SENSITIVITY)   ____________________________________________  EKG  ED ECG REPORT I, Takiesha Mcdevitt J, the attending physician, personally viewed and interpreted this ECG.   Date: 11/23/2019  EKG Time: 2248  Rate: 86  Rhythm: normal EKG, normal sinus  rhythm  Axis: Normal  Intervals:none  ST&T Change: Nonspecific  ____________________________________________  RADIOLOGY  ED MD interpretation: No acute cardiopulmonary process  Official radiology report(s): DG Chest 2 View  Result Date: 11/22/2019 CLINICAL DATA:  Dyspnea on exertion EXAM: CHEST - 2 VIEW COMPARISON:  08/31/2018 FINDINGS: The heart size and mediastinal contours are within normal limits. Both lungs are clear. The visualized skeletal structures are unremarkable. IMPRESSION: No active cardiopulmonary disease. Electronically Signed   By: Randa Ngo M.D.   On: 11/22/2019 23:11    ____________________________________________   PROCEDURES  Procedure(s) performed (including Critical Care):  Procedures   ____________________________________________   INITIAL IMPRESSION / ASSESSMENT AND PLAN / ED COURSE  As part of my medical decision making, I reviewed the following data within the [place] notes reviewed and incorporated, Labs reviewed, EKG interpreted, Old chart reviewed, Radiograph reviewed and Notes from prior ED  visits     Kara West was evaluated in Emergency Department on 11/23/2019 for the symptoms described in the history of present illness. She was evaluated in the context of the global COVID-19 pandemic, which necessitated consideration that the patient might be at risk for infection with the SARS-CoV-2 virus that causes COVID-19. Institutional protocols and algorithms that pertain to the evaluation of patients at risk for COVID-19 are in a state of rapid change based on information released by regulatory bodies including the CDC and federal and state organizations. These policies and algorithms were followed during the patient's care in the ED.    44 year old female presenting with cough, wheezing and shortness of breath. Differential includes, but is not limited to, viral syndrome, bronchitis including COPD exacerbation, pneumonia, reactive airway disease including asthma, CHF including exacerbation with or without pulmonary/interstitial edema, pneumothorax, ACS, thoracic trauma, and pulmonary embolism.  Patient is afebrile with unremarkable lab work and chest x-ray.  Will administer prednisone, Tussionex, DuoNeb and reassess.   Clinical Course as of Nov 23 643  Sat Nov 23, 2019  0644 Patient feeling significantly better.  Aeration and wheezing improved.  No coughing presently.  Will discharge home on Prednisone, Tussionex and Albuterol inhaler.  Strict return precautions given.  Patient verbalizes understanding agrees with plan of care.   [JS]    Clinical Course User Index [JS] Paulette Blanch, MD     ____________________________________________   FINAL CLINICAL IMPRESSION(S) / ED DIAGNOSES  Final diagnoses:  Bronchitis  COPD exacerbation [facility])     ED Discharge Orders         Ordered    albuterol (VENTOLIN HFA) 108 (90 Base) MCG/ACT inhaler  Every 4 hours PRN     11/23/19 0545    predniSONE (DELTASONE) 20 MG tablet     11/23/19 0545    chlorpheniramine-HYDROcodone  (TUSSIONEX PENNKINETIC ER) 10-8 MG/5ML SUER  2 times daily     11/23/19 0545           Note:  This document was prepared using Dragon voice recognition software and may include unintentional dictation errors.   Paulette Blanch, MD 11/23/19 [phone]

## 2019-12-12 ENCOUNTER — Emergency Department: Payer: BC Managed Care – PPO

## 2019-12-12 ENCOUNTER — Other Ambulatory Visit: Payer: Self-pay

## 2019-12-12 ENCOUNTER — Emergency Department
Admission: EM | Admit: 2019-12-12 | Discharge: 2019-12-13 | Disposition: A | Discharge status: Home / Self Care | Payer: BC Managed Care – PPO | Attending: Emergency Medicine | Admitting: Emergency Medicine

## 2019-12-12 ENCOUNTER — Encounter: Payer: Self-pay | Admitting: Emergency Medicine

## 2019-12-12 DIAGNOSIS — Z79899 Other long term (current) drug therapy: Secondary | ICD-10-CM | POA: Insufficient documentation

## 2019-12-12 DIAGNOSIS — Z87891 Personal history of nicotine dependence: Secondary | ICD-10-CM | POA: Diagnosis not present

## 2019-12-12 DIAGNOSIS — Z20822 Contact with and (suspected) exposure to covid-19: Secondary | ICD-10-CM | POA: Diagnosis not present

## 2019-12-12 DIAGNOSIS — R079 Chest pain, unspecified: Secondary | ICD-10-CM | POA: Diagnosis not present

## 2019-12-12 DIAGNOSIS — R0602 Shortness of breath: Secondary | ICD-10-CM | POA: Diagnosis present

## 2019-12-12 DIAGNOSIS — J40 Bronchitis, not specified as acute or chronic: Secondary | ICD-10-CM | POA: Insufficient documentation

## 2019-12-12 MED ORDER — SODIUM CHLORIDE 0.9% FLUSH: 3.0000 mL | Freq: Once | INTRAVENOUS | Status: DC

## 2019-12-12 NOTE — ED Triage Notes (Signed)
Pt to ED from home c/o chest pain and pressure, SOB and cough x3 days and getting worse.  Using inhalers at home without relief, hx of COPD and bronchitis.  Pt states productive thick yellow mucous cough.  Nausea and vomiting today about 7 times.  Pt labored breathing in triage, wheezing throughout.

## 2019-12-13 ENCOUNTER — Other Ambulatory Visit: Payer: Self-pay

## 2019-12-13 ENCOUNTER — Emergency Department: Payer: BC Managed Care – PPO

## 2019-12-13 LAB — COMPREHENSIVE METABOLIC PANEL
ALT: 19 U/L (ref 0–44)
AST: 17 U/L (ref 15–41)
Albumin: 3.7 g/dL (ref 3.5–5.0)
Alkaline Phosphatase: 74 U/L (ref 38–126)
Anion gap: 9 (ref 5–15)
BUN: 11 mg/dL (ref 6–20)
CO2: 23 mmol/L (ref 22–32)
Calcium: 8.8 mg/dL — ABNORMAL LOW (ref 8.9–10.3)
Chloride: 109 mmol/L (ref 98–111)
Creatinine, Ser: 0.64 mg/dL (ref 0.44–1.00)
GFR calc Af Amer: >60 mL/min (ref >60)
GFR calc non Af Amer: >60 mL/min (ref >60)
Glucose, Bld: 99 mg/dL (ref 70–99)
Potassium: 3.5 mmol/L (ref 3.5–5.1)
Sodium: 141 mmol/L (ref 135–145)
Total Bilirubin: 0.6 mg/dL (ref 0.3–1.2)
Total Protein: 6.7 g/dL (ref 6.5–8.1)

## 2019-12-13 LAB — URINALYSIS, COMPLETE (UACMP) WITH MICROSCOPIC
Bacteria, UA: NONE SEEN
Bilirubin Urine: NEGATIVE
Glucose, UA: NEGATIVE mg/dL
Ketones, ur: 5 mg/dL — AB
Leukocytes,Ua: NEGATIVE
Nitrite: NEGATIVE
Protein, ur: NEGATIVE mg/dL
Specific Gravity, Urine: 1.019 (ref 1.005–1.030)
pH: 6 (ref 5.0–8.0)

## 2019-12-13 LAB — CBC WITH DIFFERENTIAL/PLATELET
Abs Immature Granulocytes: 0.01 10*3/uL (ref 0.00–0.07)
Basophils Absolute: 0 10*3/uL (ref 0.0–0.1)
Basophils Relative: 0 %
Eosinophils Absolute: 0 10*3/uL (ref 0.0–0.5)
Eosinophils Relative: 1 %
HCT: 37.7 % (ref 36.0–46.0)
Hemoglobin: 12.3 g/dL (ref 12.0–15.0)
Immature Granulocytes: 0 %
Lymphocytes Relative: 16 %
Lymphs Abs: 0.7 10*3/uL (ref 0.7–4.0)
MCH: 28.3 pg (ref 26.0–34.0)
MCHC: 32.6 g/dL (ref 30.0–36.0)
MCV: 86.9 fL (ref 80.0–100.0)
Monocytes Absolute: 0.4 10*3/uL (ref 0.1–1.0)
Monocytes Relative: 8 %
Neutro Abs: 3.5 10*3/uL (ref 1.7–7.7)
Neutrophils Relative %: 75 %
Platelets: 190 10*3/uL (ref 150–400)
RBC: 4.34 MIL/uL (ref 3.87–5.11)
RDW: 14.2 % (ref 11.5–15.5)
WBC: 4.6 10*3/uL (ref 4.0–10.5)
nRBC: 0 % (ref 0.0–0.2)

## 2019-12-13 LAB — LACTIC ACID, PLASMA: Lactic Acid, Venous: 1.6 mmol/L (ref 0.5–1.9)

## 2019-12-13 LAB — TROPONIN I (HIGH SENSITIVITY)
Troponin I (High Sensitivity): <2 ng/L (ref <18)
Troponin I (High Sensitivity): 4 ng/L (ref <18)

## 2019-12-13 LAB — POCT PREGNANCY, URINE: Preg Test, Ur: NEGATIVE

## 2019-12-13 LAB — RESPIRATORY PANEL BY RT PCR (FLU A&B, COVID)
Influenza A by PCR: NEGATIVE
Influenza B by PCR: NEGATIVE
SARS Coronavirus 2 by RT PCR: NEGATIVE

## 2019-12-13 LAB — FIBRIN DERIVATIVES D-DIMER (ARMC ONLY): Fibrin derivatives D-dimer (ARMC): 756.74 ng/mL (FEU) — ABNORMAL HIGH (ref 0.00–499.00)

## 2019-12-13 MED ORDER — IPRATROPIUM-ALBUTEROL 0.5-2.5 (3) MG/3ML IN SOLN
3.0000 mL | Freq: Once | RESPIRATORY_TRACT | Status: AC
  Administered 2019-12-13: 3 mL via RESPIRATORY_TRACT
  Filled 2019-12-13: qty 3

## 2019-12-13 MED ORDER — BENZONATATE 100 MG PO CAPS
100.0000 mg | ORAL_CAPSULE | Freq: Three times a day (TID) | ORAL | 0 refills | Status: AC
Start: 2019-12-13 — End: 2019-12-18

## 2019-12-13 MED ORDER — AZITHROMYCIN 250 MG PO TABS
ORAL_TABLET | ORAL | 0 refills | Status: DC
Start: 2019-12-13 — End: 2020-08-24

## 2019-12-13 MED ORDER — PREDNISONE 20 MG PO TABS: 60.0000 mg | ORAL_TABLET | Freq: Every day | ORAL | 0 refills | Status: AC

## 2019-12-13 MED ORDER — ALBUTEROL SULFATE HFA 108 (90 BASE) MCG/ACT IN AERS
2.0000 | INHALATION_SPRAY | Freq: Four times a day (QID) | RESPIRATORY_TRACT | 1 refills | Status: DC | PRN
Start: 2019-12-13 — End: 2020-08-24

## 2019-12-13 MED ORDER — METHYLPREDNISOLONE SODIUM SUCC 125 MG IJ SOLR
125.0000 mg | Freq: Once | INTRAMUSCULAR | Status: AC
  Administered 2019-12-13: 125 mg via INTRAVENOUS
  Filled 2019-12-13: qty 2

## 2019-12-13 MED ORDER — ONDANSETRON HCL 4 MG/2ML IJ SOLN
4.0000 mg | Freq: Once | INTRAMUSCULAR | Status: AC
  Administered 2019-12-13: 4 mg via INTRAVENOUS
  Filled 2019-12-13: qty 2

## 2019-12-13 MED ORDER — AZITHROMYCIN 500 MG PO TABS
500.0000 mg | ORAL_TABLET | Freq: Once | ORAL | Status: AC
  Administered 2019-12-13: 500 mg via ORAL
  Filled 2019-12-13: qty 1

## 2019-12-13 MED ORDER — IOHEXOL 350 MG/ML SOLN
75.0000 mL | Freq: Once | INTRAVENOUS | Status: AC | PRN
  Administered 2019-12-13: 75 mL via INTRAVENOUS

## 2019-12-13 NOTE — ED Provider Notes (Signed)
[facility] Emergency Department Provider Note  ____________________________________________  Time seen: Approximately 12:37 AM  I have reviewed the triage vital signs and the nursing notes.   HISTORY  Chief Complaint Shortness of Breath and Chest Pain   HPI Kara West is a 44 y.o. female with a history of COPD, chronic bronchitis, GERD who presents for evaluation of shortness of breath and cough.  Patient reports that her symptoms started 3 days ago.  She has a cough productive of yellow phlegm, chest tightness, wheezing, progressively worsening shortness of breath.   Her shortness of breath has become severe and constant this evening.  She is also complaining of worsening shortness of breath when laying flat.  She has had difficulty sleeping over the last few days.  She denies a history of CHF.  No fever or chills.  No known exposures to Covid.  Patient received Covid vaccine x2 with the second 1 last week.  She denies any personal or family history of blood clots, recent travel immobilization, leg pain or swelling, hemoptysis, or exogenous hormones.  She has had nausea and nonbloody nonbilious emesis.  No diarrhea.  No pleuritic chest pain.  Past Medical History:  Diagnosis Date  . Anemia    s/p transfusion  . Bronchitis   . COPD (chronic obstructive pulmonary disease) ([place])   . GERD (gastroesophageal reflux disease)   . History of Papanicolaou smear of cervix 10/12/2011   neg    Patient Active Problem List   Diagnosis Date Noted  . Neuropathic pain of left lower extremity 10/23/2019  . Chronic pain syndrome 04/16/2019  . Chronic radicular lumbar pain 04/16/2019  . Bilateral leg pain 11/14/2018  . Leg pain 07/11/2018  . Lymphedema 07/11/2018  . Menorrhagia with regular cycle 03/06/2018  . Bilateral lower extremity edema 12/18/2017  . Bilateral cellulitis of lower leg 12/18/2017  . Sensory polyneuropathy 08/07/2017  . Paresthesia of both  feet 02/16/2017  . Moderate Persistent Asthma 02/18/2015  . Allergic rhinitis 02/18/2015  . COPD exacerbation ([place]) 12/01/2014    Past Surgical History:  Procedure Laterality Date  . CESAREAN SECTION     x3  . DILATION AND CURETTAGE OF UTERUS  11/11/2011   hysteroscopy  . ENDOMETRIAL ABLATION  11/11/2011  . TUBAL LIGATION  1997    Prior to Admission medications   Medication Sig Start Date End Date Taking? Authorizing Provider  albuterol (VENTOLIN HFA) 108 (90 Base) MCG/ACT inhaler Inhale 2 puffs into the lungs every 6 (six) hours as needed for wheezing or shortness of breath. 12/13/19   Alfred Levins, [place], MD  amitriptyline (ELAVIL) 50 MG tablet Take 1.5 tablets (75 mg total) by mouth at bedtime. 10/02/19   Gillis Santa, MD  azithromycin (ZITHROMAX) 250 MG tablet Take 1 a day for 4 days 12/13/19   Alfred Levins, [place], MD  chlorpheniramine-HYDROcodone [facility] ER) 10-8 MG/5ML SUER Take 5 mLs by mouth 2 (two) times daily. 11/23/19   Paulette Blanch, MD  diclofenac (VOLTAREN) 75 MG EC tablet Take 75 mg by mouth 2 (two) times daily.    [provider]  DULoxetine (CYMBALTA) 60 MG capsule TAKE 1 CAPSULE BY MOUTH ONCE DAILY FOR MOOD 11/09/18   [provider]  ferrous sulfate 325 (65 FE) MG tablet Take by mouth.    [provider]  Fremanezumab-vfrm (AJOVY) 225 MG/1.5ML SOSY Inject into the skin. 12/07/18   [provider]  gabapentin (NEURONTIN) 600 MG tablet Take 1 tablet (600 mg total) by mouth  3 (three) times daily. 04/16/19 10/23/19  Gillis Santa, MD  meloxicam (MOBIC) 15 MG tablet Take 15 mg by mouth daily. 07/04/19   [provider]  predniSONE (DELTASONE) 20 MG tablet 3 tablets daily x 4 days 11/23/19   Paulette Blanch, MD  predniSONE (DELTASONE) 20 MG tablet Take 3 tablets (60 mg total) by mouth daily for 4 days. 12/13/19 12/17/19  Rudene Re, MD  rizatriptan (MAXALT) 10 MG tablet TAKE ONE TABLET BY MOUTH ONCE AS NEEDED FOR MIGRAINE.  MAY TAKE A SECOND DOSE AFTER 2 HOURS IF NEEDED. 12/09/18   [provider]  Spacer/Aero-Holding Chambers (AEROCHAMBER MV) inhaler Use as instructed 02/18/15   Juanito Doom, MD  thiamine 100 MG tablet Take 100 mg by mouth daily.     [provider]  topiramate (TOPAMAX) 50 MG tablet TAKE 1 2 (ONE HALF) TABLET BY MOUTH NIGHTLY FOR 14 DAYS THEN INCREASE TO 1 TABLET ONCE DAILY AT NIGHT 01/10/19   [provider]    Allergies Patient has no known allergies.  Family History  Problem Relation Age of Onset  . Hypertension Mother   . Diabetes Mother     Social History Social History   Tobacco Use  . Smoking status: Former Smoker    Packs/day: 0.20    Years: 5.00    Pack years: 1.00    Types: Cigarettes    Quit date: 06/11/2014    Years since quitting: 5.5  . Smokeless tobacco: Never Used  Vaping Use  . Vaping Use: Never used  Substance Use Topics  . Alcohol use: Yes    Alcohol/week: 0.0 standard drinks  . Drug use: No    Review of Systems  Constitutional: Negative for fever. Eyes: Negative for visual changes. ENT: Negative for sore throat. Neck: No neck pain  Cardiovascular: Negative for chest pain. + Chest tightness Respiratory: + shortness of breath, wheezing, productive cough Gastrointestinal: Negative for abdominal pain,  Diarrhea. + Nausea and vomiting Genitourinary: Negative for dysuria. Musculoskeletal: Negative for back pain. Skin: Negative for rash. Neurological: Negative for headaches, weakness or numbness. Psych: No SI or HI  ____________________________________________   PHYSICAL EXAM:  VITAL SIGNS: ED Triage Vitals  Enc Vitals Group     BP 12/12/19 2340 (!) 143/78     Pulse Rate 12/12/19 2340 (!) 116     Resp 12/12/19 2340 (!) 24     Temp 12/12/19 2340 99.6 F (37.6 C)     Temp Source 12/12/19 2340 Oral     SpO2 12/12/19 2340 97 %     Weight 12/12/19 2341 225 lb (102.1 kg)     Height 12/12/19 2341 5\' 9"  (1.753 m)      Head Circumference --      Peak Flow --      Pain Score 12/12/19 2341 10     Pain Loc --      Pain Edu? --      Excl. in [place]? --     Constitutional: Alert and oriented, patient is in mild to moderate respiratory distress.  HEENT:      Head: Normocephalic and atraumatic.         Eyes: Conjunctivae are normal. Sclera is non-icteric.       Mouth/Throat: Mucous membranes are moist.       Neck: Supple with no signs of meningismus. Cardiovascular: Tachycardic with regular rhythm, no murmurs.  No JVD. Respiratory: Increased work of breathing, tachypneic with no hypoxia, severely diminished air movement bilaterally with  expiratory wheezes  gastrointestinal: Soft, non tender Musculoskeletal:  No edema, cyanosis, or erythema of extremities. Neurologic: Normal speech and language. Face is symmetric. Moving all extremities. No gross focal neurologic deficits are appreciated. Skin: Skin is warm, dry and intact. No rash noted. Psychiatric: Mood and affect are normal. Speech and behavior are normal.  ____________________________________________   LABS (all labs ordered are listed, but only abnormal results are displayed)  Labs Reviewed  COMPREHENSIVE METABOLIC PANEL - Abnormal; Notable for the following components:      Result Value   Calcium 8.8 (*)    All other components within normal limits  URINALYSIS, COMPLETE (UACMP) WITH MICROSCOPIC - Abnormal; Notable for the following components:   Color, Urine YELLOW (*)    APPearance CLOUDY (*)    Hgb urine dipstick MODERATE (*)    Ketones, ur 5 (*)    All other components within normal limits  FIBRIN DERIVATIVES D-DIMER ([facility] ONLY) - Abnormal; Notable for the following components:   Fibrin derivatives D-dimer ([facility]) 756.74 (*)    All other components within normal limits  RESPIRATORY PANEL BY RT PCR (FLU A&B, COVID)  LACTIC ACID, PLASMA  CBC WITH DIFFERENTIAL/PLATELET  LACTIC ACID, PLASMA  POC URINE PREG, ED  POCT PREGNANCY, URINE    TROPONIN I (HIGH SENSITIVITY)  TROPONIN I (HIGH SENSITIVITY)   ____________________________________________  EKG  ED ECG REPORT I, Rudene Re, the attending physician, personally viewed and interpreted this ECG.  Sinus tachycardia, rate of 124, normal intervals, normal axis, no ST elevations or depressions.  Otherwise normal EKG. ____________________________________________  RADIOLOGY  I have personally reviewed the images performed during this visit and I agree with the Radiologist's read.   Interpretation by Radiologist:  DG Chest 2 View  Result Date: 12/13/2019 CLINICAL DATA:  Chest pain EXAM: CHEST - 2 VIEW COMPARISON:  Radiograph 11/22/2019, CT 10/27/2014 FINDINGS: No consolidation, features of edema, pneumothorax, or effusion. Pulmonary vascularity is normally distributed. The cardiomediastinal contours are unremarkable. No acute osseous or soft tissue abnormality. IMPRESSION: No acute cardiopulmonary abnormality. Electronically Signed   By: Lovena Le M.D.   On: 12/13/2019 00:11   CT Angio Chest PE W and/or Wo Contrast  Result Date: 12/13/2019 CLINICAL DATA:  Chest pain and pressure, shortness of breath and cough for 3 days, worsening EXAM: CT ANGIOGRAPHY CHEST WITH CONTRAST TECHNIQUE: Multidetector CT imaging of the chest was performed using the standard protocol during bolus administration of intravenous contrast. Multiplanar CT image reconstructions and MIPs were obtained to evaluate the vascular anatomy. CONTRAST:  61mL OMNIPAQUE IOHEXOL 350 MG/ML SOLN COMPARISON:  CT 10/27/2014, radiograph 12/12/2019 FINDINGS: Cardiovascular: Satisfactory opacification the pulmonary arteries to the segmental level. Distal evaluation is somewhat limited by respiratory motion artifact most pronounced towards the lung bases. No pulmonary artery filling defects are identified. Central pulmonary arteries are normal caliber. Normal heart size. No pericardial effusion. The aorta is normal  caliber. Shared origin of the brachiocephalic and left common carotid artery. Proximal great vessels are otherwise unremarkable. Mediastinum/Nodes: No mediastinal fluid or gas. Normal thyroid gland and thoracic inlet. No acute abnormality of the trachea. Moderate hiatal hernia. No worrisome mediastinal, hilar or axillary adenopathy. Lungs/Pleura: Evaluation of the parenchyma limited by respiratory motion artifact most pronounced towards the lung bases. There is diffuse mild airways thickening. No consolidation, features of edema, pneumothorax, or effusion. No suspicious pulmonary nodules or masses. Upper Abdomen: Hiatal hernia as above. Prominent fold at the gallbladder body. Possible small calcified gallstone as well without pericholecystic fluid or inflammation or  visible ductal dilatation. No other acute abnormality in the visualized portions of the upper abdomen. Musculoskeletal: No acute osseous abnormality or suspicious osseous lesion. Multilevel degenerative changes are present in the imaged portions of the spine. No worrisome chest wall lesions. Review of the MIP images confirms the above findings. IMPRESSION: 1. No evidence of acute pulmonary artery filling defects to suggest pulmonary embolism. 2. Diffuse mild airways thickening, suggestive of acute or chronic bronchitis or reactive airways disease. 3. Moderate hiatal hernia. 4. Possible cholelithiasis versus artifact in the gallbladder. Electronically Signed   By: Lovena Le M.D.   On: 12/13/2019 03:19     ____________________________________________   PROCEDURES  Procedure(s) performed:yes .1-3 Lead EKG Interpretation Performed by: Rudene Re, MD Authorized by: Rudene Re, MD     Interpretation: non-specific     ECG rate assessment: tachycardic     Rhythm: sinus tachycardia     Ectopy: none     Conduction: normal     Critical Care performed:  Yes  CRITICAL CARE Performed by: Rudene Re  ?  Total  critical care time: 35 min  Critical care time was exclusive of separately billable procedures and treating other patients.  Critical care was necessary to treat or prevent imminent or life-threatening deterioration.  Critical care was time spent personally by me on the following activities: development of treatment plan with patient and/or surrogate as well as nursing, discussions with consultants, evaluation of patient's response to treatment, examination of patient, obtaining history from patient or surrogate, ordering and performing treatments and interventions, ordering and review of laboratory studies, ordering and review of radiographic studies, pulse oximetry and re-evaluation of patient's condition.  ____________________________________________   INITIAL IMPRESSION / ASSESSMENT AND PLAN / ED COURSE  44 y.o. female with a history of COPD, chronic bronchitis, GERD who presents for evaluation of shortness of breath, chest tightness, productive cough, wheezing, nausea and vomiting.  Patient arrives in mild to moderate respiratory distress, tachypnea, tachycardia with no hypoxia.  Low-grade temp of 99.24F.  She has diminished air movement bilaterally with expiratory wheezes throughout.  Differential diagnosis including COPD exacerbation versus bronchitis versus pneumonia versus Covid versus flu versus CHF versus PE.  Chest x-ray with no evidence of infiltrate or edema, confirmed by radiology.  Due to recent Covid vaccination, tachypnea and tachycardia we will send a D-dimer.  We will get Covid and flu swabs.  Will give duo nebs, Solu-Medrol, check basic labs.  Old medical records reviewed.  Patient placed on telemetry for close monitoring.  _________________________ 4:04 AM on 12/13/2019 -----------------------------------------  D-dimer was positive therefore patient was sent for CT.  CT negative for PE or pneumonia, confirmed by radiology.  Patient feels markedly improved after 3 duo  nebs and steroids, moving good air with no further wheezing, normal work of breathing and normal sats.  High-sensitivity troponin x2 -.  No electrolyte derangements.  No leukocytosis.  Negative Covid and flu.  She was monitored for several hours post treatment.  Will discharge home on a Z-Pak, prednisone, albuterol and close follow-up with PCP.  Discussed my standard return precautions.    _____________________________________________ Please note:  Patient was evaluated in Emergency Department today for the symptoms described in the history of present illness. Patient was evaluated in the context of the global COVID-19 pandemic, which necessitated consideration that the patient might be at risk for infection with the SARS-CoV-2 virus that causes COVID-19. Institutional protocols and algorithms that pertain to the evaluation of patients at risk for COVID-19 are in  a state of rapid change based on information released by regulatory bodies including the CDC and federal and state organizations. These policies and algorithms were followed during the patient's care in the ED.  Some ED evaluations and interventions may be delayed as a result of limited staffing during the pandemic.   [place] Controlled Substance Database was reviewed by me. ____________________________________________   FINAL CLINICAL IMPRESSION(S) / ED DIAGNOSES   Final diagnoses:  Bronchitis      NEW MEDICATIONS STARTED DURING THIS VISIT:  ED Discharge Orders         Ordered    predniSONE (DELTASONE) 20 MG tablet  Daily     Discontinue  Reprint     12/13/19 0325    azithromycin (ZITHROMAX) 250 MG tablet     Discontinue  Reprint     12/13/19 0325    albuterol (VENTOLIN HFA) 108 (90 Base) MCG/ACT inhaler  Every 6 hours PRN     Discontinue  Reprint     12/13/19 0325           Note:  This document was prepared using Dragon voice recognition software and may include unintentional dictation errors.    Alfred Levins, [place],  MD 12/13/19 [phone]

## 2019-12-13 NOTE — ED Notes (Signed)
E signature pad not working. Pt educated on discharge instructions and verbalized understanding.  

## 2019-12-30 ENCOUNTER — Telehealth: Payer: Self-pay

## 2019-12-30 NOTE — Telephone Encounter (Signed)
Received authorization for SCS trial on 12/30/19. It expires 03/17/20. Sent message to Jocelyn Lamer 12/30/19, she is out of town this week, so this will be addressed next week.

## 2020-01-15 ENCOUNTER — Ambulatory Visit
Admission: RE | Admit: 2020-01-15 | Discharge: 2020-01-15 | Disposition: A | Discharge status: Home / Self Care | Payer: BC Managed Care – PPO | Source: Ambulatory Visit | Attending: Student in an Organized Health Care Education/Training Program | Admitting: Student in an Organized Health Care Education/Training Program

## 2020-01-15 ENCOUNTER — Encounter: Payer: Self-pay | Admitting: Student in an Organized Health Care Education/Training Program

## 2020-01-15 ENCOUNTER — Other Ambulatory Visit: Payer: Self-pay

## 2020-01-15 ENCOUNTER — Ambulatory Visit (HOSPITAL_BASED_OUTPATIENT_CLINIC_OR_DEPARTMENT_OTHER): Payer: BC Managed Care – PPO | Admitting: Student in an Organized Health Care Education/Training Program

## 2020-01-15 DIAGNOSIS — G894 Chronic pain syndrome: Secondary | ICD-10-CM | POA: Insufficient documentation

## 2020-01-15 DIAGNOSIS — M792 Neuralgia and neuritis, unspecified: Secondary | ICD-10-CM | POA: Diagnosis present

## 2020-01-15 DIAGNOSIS — G608 Other hereditary and idiopathic neuropathies: Secondary | ICD-10-CM | POA: Insufficient documentation

## 2020-01-15 MED ORDER — ROPIVACAINE HCL 2 MG/ML IJ SOLN
9.0000 mL | Freq: Once | INTRAMUSCULAR | Status: AC
  Administered 2020-01-15: 9 mL via PERINEURAL
  Filled 2020-01-15: qty 10

## 2020-01-15 MED ORDER — CEPHALEXIN 500 MG PO CAPS: 500.0000 mg | ORAL_CAPSULE | Freq: Four times a day (QID) | ORAL | 0 refills | Status: AC

## 2020-01-15 MED ORDER — FENTANYL CITRATE (PF) 100 MCG/2ML IJ SOLN
25.0000 ug | INTRAMUSCULAR | Status: DC | PRN
  Administered 2020-01-15: 100 ug via INTRAVENOUS
  Filled 2020-01-15: qty 2

## 2020-01-15 MED ORDER — CEFAZOLIN SODIUM-DEXTROSE 2-4 GM/100ML-% IV SOLN
2.0000 g | Freq: Once | INTRAVENOUS | Status: AC
  Administered 2020-01-15: 2 g via INTRAVENOUS

## 2020-01-15 MED ORDER — LIDOCAINE HCL 2 % IJ SOLN
20.0000 mL | Freq: Once | INTRAMUSCULAR | Status: AC
  Administered 2020-01-15: 400 mg

## 2020-01-15 MED ORDER — CEFAZOLIN SODIUM 1 G IJ SOLR
INTRAMUSCULAR | Status: AC
  Filled 2020-01-15: qty 20

## 2020-01-15 MED ORDER — LACTATED RINGERS IV SOLN: 1000.0000 mL | Freq: Once | INTRAVENOUS | Status: DC

## 2020-01-15 NOTE — Progress Notes (Signed)
Safety precautions to be maintained throughout the outpatient stay will include: orient to surroundings, keep bed in low position, maintain call bell within reach at all times, provide assistance with transfer out of bed and ambulation.  

## 2020-01-15 NOTE — Patient Instructions (Addendum)
Today we did the following -We have done a Spinal Cord Stimulator Trial with  [employer]   -As long as the leads are in place, do not bathe or shower. You may sponge bathe.  -While the lead is in place, please limit the bending, lifting, or twisting because the lead can move.  -The things we want to see is if your pain improves (and by what percentage), if you can do more activity (don't overdo it), and if you can use less of your "as needed" medicine. Do not stop long acting medicines like methadone, oxycontin, MS Contin, etc without checking with [place].  -It is VERY important that you pick up the antibiotics we prescribed, Keflex, on your way home from the trial and take them as prescribed(4 times a day), starting today, for as long as the lead is in place.  -The Spina Cord Stimulator Representative will be in contact with you while the lead is in place to make sure the trial goes as well as possible.  -Please contact us with any questions or concerns at any time during the trial.   -Ok to be out of WORK for the next 7 days during duration of trial  -If you start running a fever over 100 degrees, have severe back pain, or new pain running down the legs, or drainage coming from the lead site, contact us immediately and/or go to the emergency room.  -Please do not restart any sort of medication that can thin your blood such as Aspirin, ibuprofen, motrin, aleve, plavix, coumadin, etc. If you aren't sure, call and ask.  -We will have you return next Wednesday to have the lead removed. If this is successful, at that point we can go over the details about the permanent implant.  Post-procedure Information What to expect: Most procedures involve the use of a local anesthetic (numbing medicine), and a steroid (anti-inflammatory medicine).  The local anesthetics may cause temporary numbness and weakness of the legs or arms, depending on the location of the block. This numbness/weakness may  last 4-6 hours, depending on the local anesthetic used. In rare instances, it can last up to 24 hours. While numb, you must be very careful not to injure the extremity.  After any procedure, you could expect the pain to get better within 15-20 minutes. This relief is temporary and may last 4-6 hours. Once the local anesthetics wears off, you could experience discomfort, possibly more than usual, for up to 10 (ten) days. In the case of radiofrequencies, it may last up to 6 weeks. Surgeries may take up to 8 weeks for the healing process. The discomfort is due to the irritation caused by needles going through skin and muscle. To minimize the discomfort, we recommend using ice the first day, and heat from then on. The ice should be applied for 15 minutes on, and 15 minutes off. Keep repeating this cycle until bedtime. Avoid applying the ice directly to the skin, to prevent frostbite. Heat should be used daily, until the pain improves (4-10 days). Be careful not to burn yourself.  Occasionally you may experience muscle spasms or cramps. These occur as a consequence of the irritation caused by the needle sticks to the muscle and the blood that will inevitably be lost into the surrounding muscle tissue. Blood tends to be very irritating to tissues, which tend to react by going into spasm. These spasms may start the same day of your procedure, but they may also take days to develop.  This late onset type of spasm or cramp is usually caused by electrolyte imbalances triggered by the steroids, at the level of the kidney. Cramps and spasms tend to respond well to muscle relaxants, multivitamins (some are triggered by the procedure, but may have their origins in vitamin deficiencies), and "Gatorade", or any sports drinks that can replenish any electrolyte imbalances. (If you are a diabetic, ask your pharmacist to get you a sugar-free brand.) Warm showers or baths may also be helpful. Stretching exercises are highly  recommended. General Instructions:  Be alert for signs of possible infection: redness, swelling, heat, red streaks, elevated temperature, and/or fever. These typically appear 4 to 6 days after the procedure. Immediately notify your doctor if you experience unusual bleeding, difficulty breathing, or loss of bowel or bladder control. If you experience increased pain, do not increase your pain medicine intake, unless instructed by your pain physician. Post-Procedure Care:  Be careful in moving about. Muscle spasms in the area of the injection may occur. Applying ice or heat to the area is often helpful. The incidence of spinal headaches after epidural injections ranges between 1.4% and 6%. If you develop a headache that does not seem to respond to conservative therapy, please let your physician know. This can be treated with an epidural blood patch.   Post-procedure numbness or redness is to be expected, however it should average 4 to 6 hours. If numbness and weakness of your extremities begins to develop 4 to 6 hours after your procedure, and is felt to be progressing and worsening, immediately contact your physician.   Diet:  If you experience nausea, do not eat until this sensation goes away. If you had a "Stellate Ganglion Block" for upper extremity "Reflex Sympathetic Dystrophy", do not eat or drink until your hoarseness goes away. In any case, always start with liquids first and if you tolerate them well, then slowly progress to more solid foods. Activity:  For the first 4 to 6 hours after the procedure, use caution in moving about as you may experience numbness and/or weakness. Use caution in cooking, using household electrical appliances, and climbing steps. If you need to reach your Doctor call our office: [phone]) [phone] Monday-Thursday 8:00 am - 4:00 PM    Fridays: Closed     In case of an emergency: In case of emergency, call 911 or go to the nearest emergency room and have the physician there  call us.  Interpretation of Procedure Every nerve block has two components: a diagnostic component, and a treatment component. Unrealistic expectations are the most common causes of "perceived failure".  In a perfect world, a single nerve block should be able to completely and permanently eliminate the pain. Sadly, the world is not perfect.  Most pain management nerve blocks are performed using local anesthetics and steroids. Steroids are responsible for any long-term benefit that you may experience. Their purpose is to decrease any chronic swelling that may exist in the area. Steroids begin to work immediately after being injected. However, most patients will not experience any benefits until 5 to 10 days after the injection, when the swelling has come down to the point where they can tell a difference. Steroids will only help if there is swelling to be treated. As such, they can assist with the diagnosis. If effective, they suggest an inflammatory component to the pain, and if ineffective, they rule out inflammation as the main cause or component of the problem. If the problem is one of mechanical compression,  you will get no benefit from those steroids.   In the case of local anesthetics, they have a crucial role in the diagnosis of your condition. Most will begin to work within15 to 20 minutes after injection. The duration will depend on the type used (short- vs. Long-acting). It is of outmost importance that patients keep tract of their pain, after the procedure. To assist with this matter, a "Post-procedure Pain Diary" is provided. Make sure to complete it and to bring it back to your follow-up appointment.  As long as the patient keeps accurate, detailed records of their symptoms after every procedure, and returns to have those interpreted, every procedure will provide [place] with invaluable information. Even a block that does not provide the patient with any relief, will always provide [place] with  information about the mechanism and the origin of the pain. The only time a nerve block can be considered a waste of time is when patients do not keep track of the results, or do not keep their post-procedure appointment.  Reporting the results back to your physician The Pain Score  Pain is a subjective complaint. It cannot be seen, touched, or measured. We depend entirely on the patient's report of the pain in order to assess your condition and treatment. To evaluate the pain, we use a pain scale, where "0" means "No Pain", and a "10" is "the worst possible pain that you can even imagine" (i.e. something like been eaten alive by a shark or being torn apart by a lion).   You will frequently be asked to rate your pain. Please be as accurate, remember that medical decisions will be based on your responses. Please do not rate your pain above a 10. Doing so is actually interpreted as "symptom magnification" (exaggeration), as well as lack of understanding with regards to the scale. To put this into perspective, when you tell us that your pain is at a 10 (ten), what you are saying is that there is nothing we can do to make this pain any worse. (Carefully think about that.)

## 2020-01-15 NOTE — Progress Notes (Signed)
PROVIDER NOTE: Information contained herein reflects review and annotations entered in association with encounter. Interpretation of such information and data should be left to medically-trained personnel. Information provided to patient can be located elsewhere in the medical record under "Patient Instructions". Document created using STT-dictation technology, any transcriptional errors that may result from process are unintentional.    Patient: Kara West  Service Category: Procedure  Provider: Gillis Santa, MD  DOB: 15-Jan-1976  DOS: 01/15/2020  Location: [facility]  MRN: [id number]  Setting: Ambulatory - outpatient  Referring Provider: Gillis Santa, MD  Type: Established Patient  Specialty: Interventional Pain Management  PCP: Donnie Coffin, MD   Primary Reason for Admission: Surgical management of chronic pain condition.  Procedure:  Anesthesia, Analgesia, Anxiolysis:  Type: Trial Spinal Cord Neurostimulator Implant (Percutaneous, interlaminar, posterior epidural placement) Purpose: To determine if a permanent implant may be effective in controlling some or all of Kara West's chronic pain symptoms.  Region: Lumbar Level: Mid T10 Laterality: Bilateral Paramedial  Type: Moderate (Conscious) Sedation combined with Local Anesthesia Indication(s): Analgesia and Anxiety Route: Intravenous (IV) IV Access: Secured Sedation: Meaningful verbal contact was maintained at all times during the procedure  Local Anesthetic: Lidocaine 1-2%   Indications: 1. Neuropathic pain of left lower extremity   2. Neuropathic pain of right lower extremity   3. Sensory polyneuropathy   4. Chronic pain syndrome    Pain Score: Pre-procedure:4/10 Post-procedure: 0-No pain/10   Patient presents today for [place] spinal cord stimulator trial for lower extremity neuropathic pain, sensory polyneuropathy that has been refractory to conservative therapy including medication  management with various membrane stabilizers and neuropathic's along injection therapies.  Pre-op Assessment:  Kara West is a 44 y.o. (year old), female patient, seen today for interventional treatment. She  has a past surgical history that includes Cesarean section; Endometrial ablation (11/11/2011); Dilation and curettage of uterus (11/11/2011); and Tubal ligation (1997).  Initial Vital Signs:  Pulse/EKG Rate: 66ECG Heart Rate: 67 Temp: 97.7 F (36.5 C) Resp: 16 BP: 121/68 SpO2: 99 %  BMI: Estimated body mass index is 33.23 kg/m as calculated from the following:   Height as of this encounter: 5\' 9"  (1.753 m).   Weight as of this encounter: 225 lb (102.1 kg).  Risk Assessment: Allergies: Reviewed. She has No Known Allergies.  Allergy Precautions: None required Coagulopathies: Reviewed. None identified.  Blood-thinner therapy: None at this time Active Infection(s): Reviewed. None identified. Kara West is afebrile  Site Confirmation: Ms. Link was asked to confirm the procedure and laterality before marking the site, which she did. Procedure checklist: Completed Consent: Before the procedure and under the influence of no sedative(s), amnesic(s), or anxiolytics, the patient was informed of the treatment options, risks and possible complications. To fulfill our ethical and legal obligations, as recommended by the American Medical Association's Code of Ethics, I have informed the patient of my clinical impression; the nature and purpose of the treatment or procedure; the risks, benefits, and possible complications of the intervention; the alternatives, including doing nothing; the risk(s) and benefit(s) of the alternative treatment(s) or procedure(s); and the risk(s) and benefit(s) of doing nothing.  Kara West was provided with information about the general risks and possible complications associated with most interventional procedures. These include, but are not limited to:  failure to achieve desired goals, infection, bleeding, organ or nerve damage, allergic reactions, paralysis, and/or death.  In addition, she was informed of those risks and possible complications associated to this particular procedure,  which include, but are not limited to: damage to the implant; failure to decrease pain; local, systemic, or serious CNS infections, intraspinal abscess with possible cord compression and paralysis, or life-threatening such as meningitis; intrathecal and/or epidural bleeding with formation of hematoma with possible spinal cord compression and permanent paralysis; organ damage; nerve injury or damage with subsequent sensory, motor, and/or autonomic system dysfunction, resulting in transient or permanent pain, numbness, and/or weakness of one or several areas of the body; allergic reactions, either minor or major life-threatening, such as anaphylactic or anaphylactoid reactions.  Furthermore, Kara West was informed of those risks and complications associated with the medications. These include, but are not limited to: allergic reactions (i.e.: anaphylactic or anaphylactoid reactions); arrhythmia;  Hypotension/hypertension; cardiovascular collapse; respiratory depression and/or shortness of breath; swelling or edema; medication-induced neural toxicity; particulate matter embolism and blood vessel occlusion with resultant organ, and/or nervous system infarction and permanent paralysis.  Finally, she was informed that Medicine is not an exact science; therefore, there is also the possibility of unforeseen or unpredictable risks and/or possible complications that may result in a catastrophic outcome. The patient indicated having understood very clearly. We have given the patient no guarantees and we have made no promises. Enough time was given to the patient to ask questions, all of which were answered to the patient's satisfaction. Kara West has indicated that she wanted to  continue with the procedure. Attestation: I, the ordering provider, attest that I have discussed with the patient the benefits, risks, side-effects, alternatives, likelihood of achieving goals, and potential problems during recovery for the procedure that I have provided informed consent. Date  Time: 01/15/2020  7:42 AM  Pre-Procedure Preparation:  Monitoring: As per clinic protocol. Respiration, ETCO2, SpO2, BP, heart rate and rhythm monitor placed and checked for adequate function Safety Precautions: Patient was assessed for positional comfort and pressure points before starting the procedure. Time-out: I initiated and conducted the "Time-out" before starting the procedure, as per protocol. The patient was asked to participate by confirming the accuracy of the "Time Out" information. Verification of the correct person, site, and procedure were performed and confirmed by me, the nursing staff, and the patient. "Time-out" conducted as per Joint Commission's Universal Protocol (UP.01.01.01). Time: 0821  Description of Procedure Process:   Position: Prone Target Area: Posterior epidural space Approach: Posterior percutaneous, paramedial, interlaminar approach Area Prepped: Bilateral thoraco-lumbar Region Prepping solution: ChloraPrep (2% chlorhexidine gluconate and 70% isopropyl alcohol) Safety Precautions: Safe injection practices and needle disposal techniques used. Medications properly checked for expiration dates. SDV (single dose vial) medications used. Aspiration looking for blood return and/or CSF was conducted prior to all injections. At no point did I inject any substances, as a needle was being advanced. No attempts were made at seeking any paresthesias.  Description of the Procedure: Availability of a responsible, adult driver, and NPO status confirmed. Informed consent was obtained after having discussed risks and possible complications. An IV was started. The patient was then taken to  the fluoroscopy suite, where the patient was placed in position for the procedure, over the fluoroscopy table. The patient was then monitored in the usual manner. Fluoroscopy was manipulated to obtain the best possible view of the target. Parallex error was corrected before commencing the procedure. Once a clear view of the target had been obtained, the skin and deeper tissues over the procedure site were infiltrated using lidocaine, loaded in a 10 cc luer-loc syringe with a 0.5 inch, 25-G needle. The introducer needle(s) was/were then  inserted through the skin and deeper tissues. A paramidline approach was used to enter the posterior epidural space at a 30 angle, using "Loss-of-resistance Technique" with 3 ml of PF-NaCl (0.9% NSS). Correct needle placement was confirmed in the antero-posterior and lateral fluoroscopic views. The lead was gently introduced and manipulated under real-time fluoroscopy, constantly assessing for pain, discomfort, or paresthesias, until the tip rested at the desired level. Both sides were done in identical fashion. Electrode placement was tested until appropriate coverage was attained. Once the patient confirmed that the stimulation was over the desired area, the lead(s) was/were secured in place and the introducer needles removed. This was done under real-time fluoroscopy while observing the electrode tip to avoid unintended migration. The area was covered with a non-occlusive dressing and the patient transported to recovery for further programming.  Vitals:   01/15/20 0935 01/15/20 0943 01/15/20 0953 01/15/20 1003  BP: 101/77 116/67 112/80 115/70  Pulse:      Resp: 16 16 16    Temp:  (!) 96.7 F (35.9 C)  (!) 97 F (36.1 C)  SpO2: 100% 100% 100% 100%  Weight:      Height:       Start Time: 0821 hrs. End Time: 0935 hrs.  Neurostimulator Details:   Lead(s):  Brand: [employer]  Epidural Access Level:  L3-4 L3-4  Lead implant:  Bilateral    Laterality:  Left  (entry at right L3/4)  Right (entry at left L3/4)  Top electrode location:  T10 (mid T10) T10 (mid T10)  Model No.: [id number]  Length: 50 cm Same  Lot No.:   [id number]  MRI compatibility:  Yes            Imaging Guidance (Spinal):          Type of Imaging Technique: Fluoroscopy Guidance (Spinal) Indication(s): Assistance in needle guidance and placement for procedures requiring needle placement in or near specific anatomical locations not easily accessible without such assistance. Exposure Time: Please see nurses notes. Contrast: None used. Fluoroscopic Guidance: I was personally present during the use of fluoroscopy. "Tunnel Vision Technique" used to obtain the best possible view of the target area. Parallax error corrected before commencing the procedure. "Direction-depth-direction" technique used to introduce the needle under continuous pulsed fluoroscopy. Once target was reached, antero-posterior, oblique, and lateral fluoroscopic projection used confirm needle placement in all planes. Images permanently stored in EMR. Interpretation: No contrast injected. I personally interpreted the imaging intraoperatively. Adequate needle placement confirmed in multiple planes. Permanent images saved into the patient's record.  Post-operative Assessment:  Post-procedure Vital Signs:  Pulse/HCG Rate: 66(!) 58 (sb) Temp: (!) 97 F (36.1 C) Resp: 16 BP: 115/70 SpO2: 979 %  Complications: No immediate post-treatment complications observed by team, or reported by patient.  Note: The patient tolerated the entire procedure well. A repeat set of vitals were taken after the procedure and the patient was kept under observation following institutional policy, for this type of procedure. Post-procedural neurological assessment was performed, showing return to baseline, prior to discharge. The patient was provided with post-procedure discharge instructions, including a section on how to  identify potential problems. Should any problems arise concerning this procedure, the patient was given instructions to immediately contact us, at any time, without hesitation. In any case, we plan to contact the patient by telephone for a follow-up status report regarding this interventional procedure.  Comments:  No additional relevant information.  Plan of Care  Orders:  Orders Placed This Encounter  Procedures  .  DG PAIN CLIN[facility] C-ARM 1-60 MIN NO REPORT    Intraoperative interpretation by procedural physician at [place].    Standing Status:   Standing    Number of Occurrences:   1    Order Specific Question:   Reason for exam:    Answer:   Assistance in needle guidance and placement for procedures requiring needle placement in or near specific anatomical locations not easily accessible without such assistance.     Follow-up plan:   Return in about 1 week (around 01/22/2020) for SCS lead pull .      Status post right L4/5 ESI #1 on 05/01/2019: 8 cc injected, #2 06/26/19: 9 cc injected- not effective.  Status post lidocaine infusion: Not effective.  On amitriptyline, Cymbalta, gabapentin. [employer] SCS Trial 01/15/20        Recent Visits Date Type Provider Dept  10/23/19 Office Visit Gillis Santa, MD [facility]  Showing recent visits within past 90 days and meeting all other requirements Today's Visits Date Type Provider Dept  01/15/20 Procedure visit Gillis Santa, MD [facility]  Showing today's visits and meeting all other requirements Future Appointments Date Type Provider Dept  01/22/20 Appointment Gillis Santa, MD [facility]  Showing future appointments within next 90 days and meeting all other requirements  Disposition: Discharge home  Discharge (Date  Time): 01/15/2020;   hrs.   Primary Care Physician: Donnie Coffin, MD Location: [facility] Note by: Gillis Santa, MD Date: 01/15/2020;  Time: 10:56 AM

## 2020-01-15 NOTE — Progress Notes (Signed)
Entry @ L3-4 Left L3 lead @ Right T11 Right L3 lead @ Left T 11

## 2020-01-22 ENCOUNTER — Ambulatory Visit
Admission: RE | Admit: 2020-01-22 | Discharge: 2020-01-22 | Disposition: A | Discharge status: Home / Self Care | Payer: BC Managed Care – PPO | Source: Ambulatory Visit | Attending: Student in an Organized Health Care Education/Training Program | Admitting: Student in an Organized Health Care Education/Training Program

## 2020-01-22 ENCOUNTER — Ambulatory Visit (HOSPITAL_BASED_OUTPATIENT_CLINIC_OR_DEPARTMENT_OTHER): Payer: BC Managed Care – PPO | Admitting: Student in an Organized Health Care Education/Training Program

## 2020-01-22 ENCOUNTER — Other Ambulatory Visit: Payer: Self-pay

## 2020-01-22 ENCOUNTER — Encounter: Payer: Self-pay | Admitting: Student in an Organized Health Care Education/Training Program

## 2020-01-22 VITALS — BP 149/95 | HR 78 | Temp 98.9°F | Resp 18 | Ht 69.0 in | Wt 225.0 lb

## 2020-01-22 DIAGNOSIS — M792 Neuralgia and neuritis, unspecified: Secondary | ICD-10-CM | POA: Diagnosis present

## 2020-01-22 DIAGNOSIS — G608 Other hereditary and idiopathic neuropathies: Secondary | ICD-10-CM | POA: Diagnosis not present

## 2020-01-22 NOTE — Progress Notes (Signed)
Safety precautions to be maintained throughout the outpatient stay will include: orient to surroundings, keep bed in low position, maintain call bell within reach at all times, provide assistance with transfer out of bed and ambulation.  

## 2020-01-22 NOTE — Progress Notes (Signed)
PROVIDER NOTE: Information contained herein reflects review and annotations entered in association with encounter. Interpretation of such information and data should be left to medically-trained personnel. Information provided to patient can be located elsewhere in the medical record under "Patient Instructions". Document created using STT-dictation technology, any transcriptional errors that may result from process are unintentional.    Patient: Kara West  Service Category: E/M  Provider: Gillis Santa, MD  DOB: [birth year]  DOS: 01/22/2020  Specialty: Interventional Pain Management  MRN: [id number]  Setting: Ambulatory outpatient  PCP: Donnie Coffin, MD  Type: Established Patient    Referring Provider: Donnie Coffin, MD  Location: Office  Delivery: Face-to-face     HPI  Reason for encounter: Ms. Kara West, a 44 y.o. year old female, is here today for evaluation and management of her Sensory polyneuropathy [G60.8]. [name]'s primary complain today is Pain (denies pain today) Last encounter: Practice (01/15/2020). My last encounter with her was on 01/15/2020. Pertinent problems: Ms. Kara West has Paresthesia of both feet; Bilateral leg pain; Sensory polyneuropathy; Chronic pain syndrome; Chronic radicular lumbar pain; and Neuropathic pain of left lower extremity on their pertinent problem list. Pain Assessment: Severity of Chronic pain is reported as a 0-No pain/10. Location: Foot Right, Left/ . Onset: More than a month ago. Quality: Burning, Throbbing. Timing: Constant. Modifying factor(s): SCS. Vitals:  height is _0  (1.753 m) and weight is 225 lb (102.1 kg). Her oral temperature is 98.9 F (37.2 C). Her blood pressure is 149/95 (abnormal) and her pulse is 78. Her respiration is 18 and oxygen saturation is 99%.     Post-Procedure Evaluation  Procedure (01/15/2020):   [employer] spinal cord stimulator trial (dual-lead) for lower extremity neuropathic pain secondary  to sensory polyneuropathy  Sedation: Please see nurses note.  Effectiveness during initial hour after procedure(Ultra-Short Term Relief): 85 %   Local anesthetic used: Long-acting (4-6 hours) Effectiveness: Defined as any analgesic benefit obtained secondary to the administration of local anesthetics. This carries significant diagnostic value as to the etiological location, or anatomical origin, of the pain. Duration of benefit is expected to coincide with the duration of the local anesthetic used.  Effectiveness during initial 4-6 hours after procedure(Short-Term Relief): 85 %  Long-term benefit: Defined as any relief past the pharmacologic duration of the local anesthetics.  Effectiveness past the initial 6 hours after procedure(Long-Term Relief): 85 %   Total benefit over the 7 days: 85% with improvement in functional status and reduce pain with performing ADLs.   ROS  Constitutional: Denies any fever or chills Gastrointestinal: No reported hemesis, hematochezia, vomiting, or acute GI distress Musculoskeletal: Denies any acute onset joint swelling, redness, loss of ROM, or weakness Neurological: No reported episodes of acute onset apraxia, aphasia, dysarthria, agnosia, amnesia, paralysis, loss of coordination, or loss of consciousness  Medication Review  AeroChamber MV, DULoxetine, Fremanezumab-vfrm, albuterol, amitriptyline, azithromycin, cephALEXin, chlorpheniramine-HYDROcodone, diclofenac, ferrous sulfate, gabapentin, meloxicam, predniSONE, rizatriptan, thiamine, and topiramate  History Review  Allergy: Ms. Kara West has No Known Allergies. Drug: Ms. Kara West  reports no history of drug use. Alcohol:  reports current alcohol use. Tobacco:  reports that she quit smoking about 5 years ago. Her smoking use included cigarettes. She has a 1.00 pack-year smoking history. She has never used smokeless tobacco. Social: Ms. Kara West  reports that she quit smoking about 5 years ago. Her smoking  use included cigarettes. She has a 1.00 pack-year smoking history. She has never used smokeless tobacco. She reports current alcohol use.  She reports that she does not use drugs. Medical:  has a past medical history of Anemia, Bronchitis, COPD (chronic obstructive pulmonary disease) ([place]), GERD (gastroesophageal reflux disease), and History of Papanicolaou smear of cervix (10/12/2011). Surgical: Ms. Kara West  has a past surgical history that includes Cesarean section; Endometrial ablation (11/11/2011); Dilation and curettage of uterus (11/11/2011); and Tubal ligation (1997). Family: family history includes Diabetes in her mother; Hypertension in her mother.  Laboratory Chemistry Profile   Renal Lab Results  Component Value Date   BUN 11 12/13/2019   CREATININE 0.64 12/13/2019   GFRAA >60 12/13/2019   GFRNONAA >60 12/13/2019     Hepatic Lab Results  Component Value Date   AST 17 12/13/2019   ALT 19 12/13/2019   ALBUMIN 3.7 12/13/2019   ALKPHOS 74 12/13/2019   LIPASE 15 (L) 10/27/2014     Electrolytes Lab Results  Component Value Date   NA 141 12/13/2019   K 3.5 12/13/2019   CL 109 12/13/2019   CALCIUM 8.8 (L) 12/13/2019   MG 2.1 06/19/2018     Bone Lab Results  Component Value Date   VD25OH 23 (L) 02/14/2017     Inflammation (CRP: Acute Phase) (ESR: Chronic Phase) Lab Results  Component Value Date   LATICACIDVEN 1.6 12/13/2019       Note: Above Lab results reviewed.    Physical Exam  General appearance: Well nourished, well developed, and well hydrated. In no apparent acute distress Mental status: Alert, oriented x 3 (person, place, & time)       Respiratory: No evidence of acute respiratory distress Eyes: PERLA Vitals: BP (!) 149/95   Pulse 78   Temp 98.9 F (37.2 C) (Oral)   Resp 18   Ht _0  (1.753 m)   Wt 225 lb (102.1 kg)   LMP 01/06/2020 (Exact Date)   SpO2 99%   BMI 33.23 kg/m  BMI: Estimated body mass index is 33.23 kg/m as calculated from  the following:   Height as of this encounter: _1  (1.753 m).   Weight as of this encounter: 225 lb (102.1 kg). Ideal: Ideal body weight: 66.2 kg (145 lb 15.1 oz) Adjusted ideal body weight: 80.5 kg (177 lb 9.1 oz)  Spinal cord stimulator leads removed, lead tips intact.  This was done under live fluoroscopy.  Assessment   Status Diagnosis  Responding Responding Responding 1. Sensory polyneuropathy   2. Neuropathic pain of right lower extremity   3. Neuropathic pain of left lower extremity      Updated Problems: Problem  Neuropathic Pain of Left Lower Extremity  Chronic Pain Syndrome  Chronic Radicular Lumbar Pain  Bilateral Leg Pain  Sensory Polyneuropathy  Paresthesia of Both Feet    Plan of Care    [name] follows up today for removal of her [employer] spinal cord stim trial leads after a successful spinal cord stimulator trial for sensory polyneuropathy causing lower extremity neuropathic pain.  Patient endorses 85% pain relief during duration of her SCS trial.  She was able to ambulate with less pain and perform ADLs and spend time with her family which she states she has not done in the past to the extent that she did over the last 7 days.  Given successful spinal cord stimulator trial, discussed permanent implant with patient and will send referral to Dr. Lacinda Axon with [place] [facility] to schedule [facility] Scientific permanent implant.  Patient endorsed understanding.  Orders:  Orders Placed This Encounter  Procedures  . DG PAIN  CLINIC C-ARM 1-60 MIN NO REPORT    Intraoperative interpretation by procedural physician at [place].    Standing Status:   Standing    Number of Occurrences:   1    Order Specific Question:   Reason for exam:    Answer:   Assistance in needle guidance and placement for procedures requiring needle placement in or near specific anatomical locations not easily accessible without such assistance.  . Ambulatory referral to  Neurosurgery    Referral Priority:   Routine    Referral Type:   Surgical    Referral Reason:   Specialty Services Required    Referred to Provider:   [name], MD    Requested Specialty:   Neurosurgery    Number of Visits Requested:   1   Follow-up plan:   No follow-ups on file.     Status post right L4/5 ESI #1 on 05/01/2019: 8 cc injected, #2 06/26/19: 9 cc injected- not effective.  Status post lidocaine infusion: Not effective.  On amitriptyline, Cymbalta, gabapentin. [employer] SCS Trial 01/15/20: Successful.  Referral to Dr. Lacinda Axon for permanent implant.         Recent Visits Date Type Provider Dept  01/15/20 Procedure visit Gillis Santa, MD [facility]  Showing recent visits within past 90 days and meeting all other requirements Today's Visits Date Type Provider Dept  01/22/20 Procedure visit Gillis Santa, MD [facility]  Showing today's visits and meeting all other requirements Future Appointments No visits were found meeting these conditions. Showing future appointments within next 90 days and meeting all other requirements  I discussed the assessment and treatment plan with the patient. The patient was provided an opportunity to ask questions and all were answered. The patient agreed with the plan and demonstrated an understanding of the instructions.  Patient advised to call back or seek an in-person evaluation if the symptoms or condition worsens.  Duration of encounter: 68mnutes.  Note by: BGillis Santa MD Date: 01/22/2020; Time: 2:11 PM

## 2020-01-22 NOTE — Assessment & Plan Note (Signed)
Status post successful [employer] spinal cord stimulator trial on 01/15/2020.  Lead removal on 01/22/2020.  Referral to Dr. Lacinda Axon for permanent implant.

## 2020-01-29 ENCOUNTER — Other Ambulatory Visit: Payer: Self-pay | Admitting: Neurosurgery

## 2020-01-29 DIAGNOSIS — G894 Chronic pain syndrome: Secondary | ICD-10-CM

## 2020-02-10 ENCOUNTER — Other Ambulatory Visit: Payer: Self-pay

## 2020-02-10 ENCOUNTER — Encounter
Admission: RE | Admit: 2020-02-10 | Discharge: 2020-02-10 | Disposition: A | Discharge status: Home / Self Care | Payer: BC Managed Care – PPO | Source: Ambulatory Visit | Attending: Neurosurgery | Admitting: Neurosurgery

## 2020-02-10 DIAGNOSIS — Z01812 Encounter for preprocedural laboratory examination: Secondary | ICD-10-CM | POA: Diagnosis not present

## 2020-02-10 LAB — URINALYSIS, ROUTINE W REFLEX MICROSCOPIC
Bacteria, UA: NONE SEEN
Bilirubin Urine: NEGATIVE
Glucose, UA: NEGATIVE mg/dL
Ketones, ur: NEGATIVE mg/dL
Leukocytes,Ua: NEGATIVE
Nitrite: NEGATIVE
Protein, ur: NEGATIVE mg/dL
Specific Gravity, Urine: 1.026 (ref 1.005–1.030)
pH: 5 (ref 5.0–8.0)

## 2020-02-10 LAB — SURGICAL PCR SCREEN
MRSA, PCR: NEGATIVE
Staphylococcus aureus: NEGATIVE

## 2020-02-10 LAB — APTT: aPTT: 33 seconds (ref 24–36)

## 2020-02-10 LAB — PROTIME-INR
INR: 1 (ref 0.8–1.2)
Prothrombin Time: 12.4 seconds (ref 11.4–15.2)

## 2020-02-10 NOTE — Patient Instructions (Signed)
Your procedure is scheduled on: Mon. 8/30 Report to Day Surgery. To find out your arrival time please call [phone] between 1PM - 3PM on Friday 8/27.  Remember: Instructions that are not followed completely may result in serious medical risk,  up to and including death, or upon the discretion of your surgeon and anesthesiologist your  surgery may need to be rescheduled.     _X__ 1. Do not eat food after midnight the night before your procedure.                 No chewing gum or hard candies. You may drink clear liquids up to 2 hours                 before you are scheduled to arrive for your surgery- DO not drink clear                 liquids within 2 hours of the start of your surgery.                 Clear Liquids include:  water, apple juice without pulp, clear Gatorade, G2 or                  Gatorade Zero (avoid Red/Purple/Blue), Black Coffee or Tea (Do not add                 anything to coffee or tea). _____2.   Complete the "Ensure Clear Pre-surgery Clear Carbohydrate Drink" provided to you, 2 hours before arrival. **If you       are diabetic you will be provided with an alternative drink, Gatorade Zero or G2.  __X__2.  On the morning of surgery brush your teeth with toothpaste and water, you                may rinse your mouth with mouthwash if you wish.  Do not swallow any toothpaste of mouthwash.     _X__ 3.  No Alcohol for 24 hours before or after surgery.   ___ 4.  Do Not Smoke or use e-cigarettes For 24 Hours Prior to Your Surgery.                 Do not use any chewable tobacco products for at least 6 hours prior to                 Surgery. __  5.  Do not use any recreational drugs (marijuana, cocaine, heroin, ecstasy, MDMA or other)                For at least one week prior to your surgery.  Combination of these drugs with anesthesia                May have life threatening results.  ____  6.  Bring all medications with you on the day of  surgery if instructed.   _x___  7.  Notify your doctor if there is any change in your medical condition      (cold, fever, infections).     Do not wear jewelry, make-up, hairpins, clips or nail polish. Do not wear lotions, powders, or perfumes. You may wear deodorant. Do not shave 48 hours prior to surgery. Do not bring valuables to the hospital.    [facility] is not responsible for any belongings or valuables.  Contacts, dentures or bridgework may not be worn into surgery. Leave your suitcase in the car. After surgery it  may be brought to your room. For patients admitted to the hospital, discharge time is determined by your treatment team.   Patients discharged the day of surgery will not be allowed to drive home.   Make arrangements for someone to be with you for the first 24 hours of your Same Day Discharge.    Please read over the following fact sheets that you were given:    __x__ Take these medicines the morning of surgery with A SIP OF WATER:    1. none  2.   3.   4.  5.  6.  ____ Fleet Enema (as directed)   _x___ Use CHG Soap (or wipes) as directed  ____ Use Benzoyl Peroxide Gel as instructed  __x__ Use inhalers on the day of surgery albuterol (VENTOLIN HFA) 108 (90 Base) MCG/ACT inhaler and bring with you, Fluticasone-Salmeterol (ADVAIR DISKUS) 100-50 MCG/DOSE AEPB  ____ Stop metformin 2 days prior to surgery    ____ Take 1/2 of usual insulin dose the night before surgery. No insulin the morning          of surgery.   ____ Stop Coumadin/Plavix/aspirin on   __x__ Stop Anti-inflammatories no ibuprofen aleve or aspirin products   May take tylenol   ____ Stop supplements until after surgery.    ____ Bring C-Pap to the hospital.    If you have any questions regarding your pre-procedure instructions,  Please call Pre-admit Testing at [place]

## 2020-02-11 ENCOUNTER — Telehealth: Payer: Self-pay | Admitting: Student in an Organized Health Care Education/Training Program

## 2020-02-11 NOTE — Telephone Encounter (Addendum)
Patient is calling to check on short term disability papers sent by [employer] for the time she was out during the SCS trial. Kara West says we should complete. Please check to see if we have those back there. And let patient know if they are ready.  I sent new copy to nurses station.

## 2020-02-12 NOTE — Telephone Encounter (Signed)
Patient called and informed that the disability papers were filled out and faxed to [facility] as requested. I also mailed a copy to the patient after verifying her address with her.

## 2020-02-13 ENCOUNTER — Ambulatory Visit
Admission: RE | Admit: 2020-02-13 | Discharge: 2020-02-13 | Disposition: A | Discharge status: Home / Self Care | Payer: BC Managed Care – PPO | Source: Ambulatory Visit | Attending: Neurosurgery | Admitting: Neurosurgery

## 2020-02-13 ENCOUNTER — Other Ambulatory Visit: Payer: Self-pay

## 2020-02-13 ENCOUNTER — Other Ambulatory Visit
Admission: RE | Admit: 2020-02-13 | Discharge: 2020-02-13 | Disposition: A | Discharge status: Home / Self Care | Payer: BC Managed Care – PPO | Source: Ambulatory Visit | Attending: Neurosurgery | Admitting: Neurosurgery

## 2020-02-13 DIAGNOSIS — Z01812 Encounter for preprocedural laboratory examination: Secondary | ICD-10-CM | POA: Insufficient documentation

## 2020-02-13 DIAGNOSIS — G894 Chronic pain syndrome: Secondary | ICD-10-CM | POA: Insufficient documentation

## 2020-02-13 DIAGNOSIS — Z20822 Contact with and (suspected) exposure to covid-19: Secondary | ICD-10-CM | POA: Insufficient documentation

## 2020-02-13 LAB — SARS CORONAVIRUS 2 (TAT 6-24 HRS): SARS Coronavirus 2: NEGATIVE

## 2020-02-17 ENCOUNTER — Encounter
Admission: RE | Disposition: A | Discharge status: Home / Self Care | Payer: Self-pay | Source: Home / Self Care | Attending: Neurosurgery

## 2020-02-17 ENCOUNTER — Encounter: Payer: Self-pay | Admitting: Neurosurgery

## 2020-02-17 ENCOUNTER — Ambulatory Visit: Payer: BC Managed Care – PPO | Admitting: Certified Registered"

## 2020-02-17 ENCOUNTER — Other Ambulatory Visit: Payer: Self-pay

## 2020-02-17 ENCOUNTER — Ambulatory Visit
Admission: RE | Admit: 2020-02-17 | Discharge: 2020-02-17 | Disposition: A | Discharge status: Home / Self Care | Payer: BC Managed Care – PPO | Attending: Neurosurgery | Admitting: Neurosurgery

## 2020-02-17 ENCOUNTER — Ambulatory Visit: Payer: BC Managed Care – PPO

## 2020-02-17 DIAGNOSIS — G709 Myoneural disorder, unspecified: Secondary | ICD-10-CM | POA: Insufficient documentation

## 2020-02-17 DIAGNOSIS — Z79899 Other long term (current) drug therapy: Secondary | ICD-10-CM | POA: Diagnosis not present

## 2020-02-17 DIAGNOSIS — G894 Chronic pain syndrome: Secondary | ICD-10-CM | POA: Diagnosis not present

## 2020-02-17 DIAGNOSIS — D649 Anemia, unspecified: Secondary | ICD-10-CM | POA: Diagnosis not present

## 2020-02-17 DIAGNOSIS — Z87891 Personal history of nicotine dependence: Secondary | ICD-10-CM | POA: Insufficient documentation

## 2020-02-17 DIAGNOSIS — Z833 Family history of diabetes mellitus: Secondary | ICD-10-CM | POA: Insufficient documentation

## 2020-02-17 DIAGNOSIS — Z791 Long term (current) use of non-steroidal anti-inflammatories (NSAID): Secondary | ICD-10-CM | POA: Insufficient documentation

## 2020-02-17 DIAGNOSIS — Z8249 Family history of ischemic heart disease and other diseases of the circulatory system: Secondary | ICD-10-CM | POA: Diagnosis not present

## 2020-02-17 DIAGNOSIS — Z7951 Long term (current) use of inhaled steroids: Secondary | ICD-10-CM | POA: Diagnosis not present

## 2020-02-17 DIAGNOSIS — Z7952 Long term (current) use of systemic steroids: Secondary | ICD-10-CM | POA: Diagnosis not present

## 2020-02-17 DIAGNOSIS — K219 Gastro-esophageal reflux disease without esophagitis: Secondary | ICD-10-CM | POA: Insufficient documentation

## 2020-02-17 DIAGNOSIS — J449 Chronic obstructive pulmonary disease, unspecified: Secondary | ICD-10-CM | POA: Insufficient documentation

## 2020-02-17 DIAGNOSIS — Z419 Encounter for procedure for purposes other than remedying health state, unspecified: Secondary | ICD-10-CM

## 2020-02-17 HISTORY — PX: THORACIC LAMINECTOMY FOR SPINAL CORD STIMULATOR: SHX6887

## 2020-02-17 LAB — POCT PREGNANCY, URINE: Preg Test, Ur: NEGATIVE

## 2020-02-17 SURGERY — THORACIC LAMINECTOMY FOR SPINAL CORD STIMULATOR
Anesthesia: General

## 2020-02-17 MED ORDER — REMIFENTANIL HCL 1 MG IV SOLR
INTRAVENOUS | Status: AC
Start: 2020-02-17 — End: ?
  Filled 2020-02-17: qty 1000

## 2020-02-17 MED ORDER — VANCOMYCIN HCL 1000 MG IV SOLR
INTRAVENOUS | Status: DC | PRN
  Administered 2020-02-17: 1000 mg via TOPICAL

## 2020-02-17 MED ORDER — CEFAZOLIN SODIUM-DEXTROSE 2-4 GM/100ML-% IV SOLN
INTRAVENOUS | Status: AC
  Filled 2020-02-17: qty 100

## 2020-02-17 MED ORDER — LIDOCAINE HCL (CARDIAC) PF 100 MG/5ML IV SOSY
PREFILLED_SYRINGE | INTRAVENOUS | Status: DC | PRN
  Administered 2020-02-17: 100 mg via INTRAVENOUS

## 2020-02-17 MED ORDER — OXYCODONE HCL 5 MG PO TABS: 5.0000 mg | ORAL_TABLET | Freq: Once | ORAL | Status: DC | PRN

## 2020-02-17 MED ORDER — PROPOFOL 500 MG/50ML IV EMUL
INTRAVENOUS | Status: DC | PRN
  Administered 2020-02-17: 175 ug/kg/min via INTRAVENOUS

## 2020-02-17 MED ORDER — REMIFENTANIL HCL 1 MG IV SOLR
INTRAVENOUS | Status: DC | PRN
  Administered 2020-02-17 (×2): .1 ug/kg/min via INTRAVENOUS

## 2020-02-17 MED ORDER — LACTATED RINGERS IV SOLN: INTRAVENOUS | Status: DC

## 2020-02-17 MED ORDER — HYDROMORPHONE HCL 1 MG/ML IJ SOLN
INTRAMUSCULAR | Status: AC
  Filled 2020-02-17: qty 1

## 2020-02-17 MED ORDER — OXYCODONE HCL 5 MG PO TABS
ORAL_TABLET | ORAL | Status: AC
  Administered 2020-02-17: 10 mg via ORAL
  Filled 2020-02-17: qty 2

## 2020-02-17 MED ORDER — SUCCINYLCHOLINE CHLORIDE 20 MG/ML IJ SOLN
INTRAMUSCULAR | Status: DC | PRN
  Administered 2020-02-17: 120 mg via INTRAVENOUS

## 2020-02-17 MED ORDER — HYDROMORPHONE HCL 1 MG/ML IJ SOLN
INTRAMUSCULAR | Status: DC | PRN
  Administered 2020-02-17 (×2): .5 mg via INTRAVENOUS

## 2020-02-17 MED ORDER — OXYCODONE HCL 5 MG PO TABS: 5.0000 mg | ORAL_TABLET | Freq: Four times a day (QID) | ORAL | 0 refills | Status: AC | PRN

## 2020-02-17 MED ORDER — REMIFENTANIL HCL 1 MG IV SOLR
INTRAVENOUS | Status: AC
  Filled 2020-02-17: qty 1000

## 2020-02-17 MED ORDER — EPHEDRINE SULFATE 50 MG/ML IJ SOLN
INTRAMUSCULAR | Status: DC | PRN
  Administered 2020-02-17: 10 mg via INTRAVENOUS

## 2020-02-17 MED ORDER — PROPOFOL 500 MG/50ML IV EMUL
INTRAVENOUS | Status: AC
  Filled 2020-02-17: qty 50

## 2020-02-17 MED ORDER — SODIUM CHLORIDE FLUSH 0.9 % IV SOLN
INTRAVENOUS | Status: AC
  Filled 2020-02-17: qty 10

## 2020-02-17 MED ORDER — ORAL CARE MOUTH RINSE: 15.0000 mL | Freq: Once | OROMUCOSAL | Status: AC

## 2020-02-17 MED ORDER — CHLORHEXIDINE GLUCONATE 0.12 % MT SOLN
OROMUCOSAL | Status: AC
  Administered 2020-02-17: 15 mL via OROMUCOSAL
  Filled 2020-02-17: qty 15

## 2020-02-17 MED ORDER — DEXAMETHASONE SODIUM PHOSPHATE 10 MG/ML IJ SOLN
INTRAMUSCULAR | Status: DC | PRN
  Administered 2020-02-17: 10 mg via INTRAVENOUS

## 2020-02-17 MED ORDER — ACETAMINOPHEN 10 MG/ML IV SOLN
INTRAVENOUS | Status: AC
  Filled 2020-02-17: qty 100

## 2020-02-17 MED ORDER — PROPOFOL 10 MG/ML IV BOLUS
INTRAVENOUS | Status: DC | PRN
  Administered 2020-02-17: 40 mg via INTRAVENOUS
  Administered 2020-02-17: 160 mg via INTRAVENOUS

## 2020-02-17 MED ORDER — SODIUM CHLORIDE 0.9 % IV SOLN
INTRAVENOUS | Status: DC | PRN
  Administered 2020-02-17: 25 ug/min via INTRAVENOUS

## 2020-02-17 MED ORDER — GLYCOPYRROLATE 0.2 MG/ML IJ SOLN
INTRAMUSCULAR | Status: DC | PRN
  Administered 2020-02-17: .2 mg via INTRAVENOUS

## 2020-02-17 MED ORDER — OXYCODONE HCL 5 MG/5ML PO SOLN: 5.0000 mg | Freq: Once | ORAL | Status: DC | PRN

## 2020-02-17 MED ORDER — FENTANYL CITRATE (PF) 100 MCG/2ML IJ SOLN: 25.0000 ug | INTRAMUSCULAR | Status: DC | PRN

## 2020-02-17 MED ORDER — MIDAZOLAM HCL 2 MG/2ML IJ SOLN
INTRAMUSCULAR | Status: AC
  Filled 2020-02-17: qty 2

## 2020-02-17 MED ORDER — ACETAMINOPHEN 10 MG/ML IV SOLN
INTRAVENOUS | Status: DC | PRN
  Administered 2020-02-17: 1000 mg via INTRAVENOUS

## 2020-02-17 MED ORDER — ACETAMINOPHEN 10 MG/ML IV SOLN: 1000.0000 mg | Freq: Once | INTRAVENOUS | Status: DC | PRN

## 2020-02-17 MED ORDER — FENTANYL CITRATE (PF) 100 MCG/2ML IJ SOLN
INTRAMUSCULAR | Status: DC | PRN
Start: 2020-02-17 — End: 2020-02-17
  Administered 2020-02-17: 100 ug via INTRAVENOUS

## 2020-02-17 MED ORDER — FENTANYL CITRATE (PF) 100 MCG/2ML IJ SOLN
INTRAMUSCULAR | Status: AC
  Filled 2020-02-17: qty 2

## 2020-02-17 MED ORDER — ONDANSETRON HCL 4 MG/2ML IJ SOLN: 4.0000 mg | Freq: Once | INTRAMUSCULAR | Status: DC | PRN

## 2020-02-17 MED ORDER — DEXMEDETOMIDINE HCL IN NACL 200 MCG/50ML IV SOLN
INTRAVENOUS | Status: DC | PRN
  Administered 2020-02-17: 12 ug via INTRAVENOUS

## 2020-02-17 MED ORDER — CHLORHEXIDINE GLUCONATE 0.12 % MT SOLN: 15.0000 mL | Freq: Once | OROMUCOSAL | Status: AC

## 2020-02-17 MED ORDER — CEFAZOLIN SODIUM-DEXTROSE 2-4 GM/100ML-% IV SOLN
2.0000 g | Freq: Once | INTRAVENOUS | Status: AC
  Administered 2020-02-17: 2 g via INTRAVENOUS

## 2020-02-17 MED ORDER — PHENYLEPHRINE HCL (PRESSORS) 10 MG/ML IV SOLN
INTRAVENOUS | Status: DC | PRN
  Administered 2020-02-17: 200 ug via INTRAVENOUS
  Administered 2020-02-17: 100 ug via INTRAVENOUS

## 2020-02-17 MED ORDER — MIDAZOLAM HCL 5 MG/5ML IJ SOLN
INTRAMUSCULAR | Status: DC | PRN
  Administered 2020-02-17: 2 mg via INTRAVENOUS

## 2020-02-17 MED ORDER — BUPIVACAINE-EPINEPHRINE (PF) 0.5% -1:200000 IJ SOLN
INTRAMUSCULAR | Status: DC | PRN
  Administered 2020-02-17: 10 mL

## 2020-02-17 MED ORDER — ONDANSETRON HCL 4 MG/2ML IJ SOLN
INTRAMUSCULAR | Status: DC | PRN
  Administered 2020-02-17 (×2): 4 mg via INTRAVENOUS

## 2020-02-17 MED ORDER — OXYCODONE HCL 5 MG PO TABS
10.0000 mg | ORAL_TABLET | Freq: Four times a day (QID) | ORAL | Status: DC | PRN
  Filled 2020-02-17: qty 2

## 2020-02-17 MED ORDER — FAMOTIDINE 20 MG PO TABS: 20.0000 mg | ORAL_TABLET | Freq: Once | ORAL | Status: AC

## 2020-02-17 MED ORDER — FAMOTIDINE 20 MG PO TABS
ORAL_TABLET | ORAL | Status: AC
  Administered 2020-02-17: 20 mg via ORAL
  Filled 2020-02-17: qty 1

## 2020-02-17 SURGICAL SUPPLY — 91 items
35cm Long tunnelling Tool ×2 IMPLANT
ADH SKN CLS APL DERMABOND .7 (GAUZE/BANDAGES/DRESSINGS) ×1
AGENT HMST MTR 8 SURGIFLO (HEMOSTASIS)
ANCH LD 4 SETX2 CLIK X (Stimulator) ×1 IMPLANT
ANCHOR CLIK X NEURO (Stimulator) ×2 IMPLANT
APL PRP STRL LF DISP 70% ISPRP (MISCELLANEOUS) ×1
APL SRG 60D 8 XTD TIP BNDBL (TIP)
BLADE BOVIE TIP EXT 4 (BLADE) ×2 IMPLANT
BUR NEURO DRILL SOFT 3.0X3.8M (BURR) IMPLANT
CABLE OR STIMULATOR 2X8 61 (WIRE) ×2 IMPLANT
CANISTER SUCT 1200ML W/VALVE (MISCELLANEOUS) ×4 IMPLANT
CHLORAPREP W/TINT 26 (MISCELLANEOUS) ×2 IMPLANT
CNTNR SPEC 2.5X3XGRAD LEK (MISCELLANEOUS) ×1
CONT SPEC 4OZ STER OR WHT (MISCELLANEOUS) ×1
CONT SPEC 4OZ STRL OR WHT (MISCELLANEOUS) ×1
CONTAINER SPEC 2.5X3XGRAD LEK (MISCELLANEOUS) ×1 IMPLANT
CONTROL REMOTE FREELINK ALPHA (NEUROSURGERY SUPPLIES) ×2 IMPLANT
COUNTER NEEDLE 20/40 LG (NEEDLE) ×2 IMPLANT
COVER LIGHT HANDLE STERIS (MISCELLANEOUS) ×4 IMPLANT
COVER WAND RF STERILE (DRAPES) ×2 IMPLANT
CUP MEDICINE 2OZ PLAST GRAD ST (MISCELLANEOUS) ×2 IMPLANT
DERMABOND ADVANCED (GAUZE/BANDAGES/DRESSINGS) ×1
DERMABOND ADVANCED .7 DNX12 (GAUZE/BANDAGES/DRESSINGS) ×1 IMPLANT
DEVICE FIXATE SUTURING DUAL (MISCELLANEOUS) ×4 IMPLANT
DRAPE C-ARM 42X72 X-RAY (DRAPES) ×4 IMPLANT
DRAPE C-ARM XRAY 36X54 (DRAPES) IMPLANT
DRAPE C-ARMOR (DRAPES) ×2 IMPLANT
DRAPE LAPAROTOMY 100X77 ABD (DRAPES) ×2 IMPLANT
DRAPE MICROSCOPE SPINE 48X150 (DRAPES) IMPLANT
DRAPE SURG 17X11 SM STRL (DRAPES) ×2 IMPLANT
DURASEAL APPLICATOR TIP (TIP) IMPLANT
DURASEAL SPINE SEALANT 3ML (MISCELLANEOUS) IMPLANT
ELECT CAUTERY BLADE TIP 2.5 (TIP) ×2
ELECT EZSTD 165MM 6.5IN (MISCELLANEOUS) ×2
ELECT REM PT RETURN 9FT ADLT (ELECTROSURGICAL) ×2
ELECTRODE CAUTERY BLDE TIP 2.5 (TIP) ×1 IMPLANT
ELECTRODE EZSTD 165MM 6.5IN (MISCELLANEOUS) ×1 IMPLANT
ELECTRODE REM PT RTRN 9FT ADLT (ELECTROSURGICAL) ×1 IMPLANT
ENTRADA NEEDLE ×2 IMPLANT
FEE INTRAOP MONITOR IMPULS NCS (MISCELLANEOUS) ×1 IMPLANT
GAUZE 4X4 16PLY RFD (DISPOSABLE) ×2 IMPLANT
GAUZE SPONGE 4X4 12PLY STRL (GAUZE/BANDAGES/DRESSINGS) IMPLANT
GLOVE BIOGEL PI IND STRL 7.0 (GLOVE) ×1 IMPLANT
GLOVE BIOGEL PI IND STRL 8 (GLOVE) ×1 IMPLANT
GLOVE BIOGEL PI INDICATOR 7.0 (GLOVE) ×1
GLOVE BIOGEL PI INDICATOR 8 (GLOVE) ×1
GLOVE SURG SYN 7.0 (GLOVE) ×4 IMPLANT
GLOVE SURG SYN 8.0 (GLOVE) ×2 IMPLANT
GOWN STRL REUS W/ TWL LRG LVL3 (GOWN DISPOSABLE) ×1 IMPLANT
GOWN STRL REUS W/ TWL XL LVL3 (GOWN DISPOSABLE) ×2 IMPLANT
GOWN STRL REUS W/TWL LRG LVL3 (GOWN DISPOSABLE) ×2
GOWN STRL REUS W/TWL XL LVL3 (GOWN DISPOSABLE) ×4
GRADUATE 1200CC STRL 31836 (MISCELLANEOUS) ×2 IMPLANT
INTRAOP MONITOR FEE IMPULS NCS (MISCELLANEOUS) ×1
INTRAOP MONITOR FEE IMPULSE (MISCELLANEOUS) ×1
KIT BLADEGUARD II DBL (SET/KITS/TRAYS/PACK) ×2 IMPLANT
KIT CHARGING (KITS) ×1
KIT CHARGING PRECISION NEURO (KITS) ×1 IMPLANT
KIT IPG ALPHA WAVEWRITER (Stimulator) ×2 IMPLANT
KIT LEAD 50CM (Lead) ×4 IMPLANT
KIT TURNOVER KIT A (KITS) ×2 IMPLANT
LEAD BLANK NEURO SET/2 ×2 IMPLANT
MARKER SKIN DUAL TIP RULER LAB (MISCELLANEOUS) ×4 IMPLANT
NDL SAFETY ECLIPSE 18X1.5 (NEEDLE) ×1 IMPLANT
NEEDLE ENTRADA 4.5IN (NEEDLE) ×2 IMPLANT
NEEDLE EPIDURAL COUDE CVD 14X4 (NEEDLE) ×4 IMPLANT
NEEDLE HYPO 18GX1.5 SHARP (NEEDLE) ×2
NEEDLE HYPO 22GX1.5 SAFETY (NEEDLE) ×2 IMPLANT
NS IRRIG 1000ML POUR BTL (IV SOLUTION) ×2 IMPLANT
O.R. Cable 2x8, 61cm and Extension ×2 IMPLANT
PACK LAMINECTOMY NEURO (CUSTOM PROCEDURE TRAY) ×2 IMPLANT
PAD ARMBOARD 7.5X6 YLW CONV (MISCELLANEOUS) ×2 IMPLANT
RX Coude Epidural needle ×2 IMPLANT
RX coude epidural needle ×2 IMPLANT
SPOGE SURGIFLO 8M (HEMOSTASIS)
SPONGE SURGIFLO 8M (HEMOSTASIS) IMPLANT
STAPLER SKIN PROX 35W (STAPLE) IMPLANT
SUT ETHILON 3-0 FS-10 30 BLK (SUTURE) ×4
SUT POLYSORB 2-0 5X18 GS-10 (SUTURE) ×18 IMPLANT
SUT SILK 2 0 SH (SUTURE) ×2 IMPLANT
SUT VIC AB 0 CT1 18XCR BRD 8 (SUTURE) ×2 IMPLANT
SUT VIC AB 0 CT1 8-18 (SUTURE) ×4
SUTURE EHLN 3-0 FS-10 30 BLK (SUTURE) ×2 IMPLANT
SYR 10ML LL (SYRINGE) ×4 IMPLANT
SYR 20ML LL LF (SYRINGE) ×2 IMPLANT
SYR 30ML LL (SYRINGE) ×4 IMPLANT
SYR 3ML LL SCALE MARK (SYRINGE) ×2 IMPLANT
TOOL LONG TUNNEL (SPINAL CORD STIMULATOR) ×2 IMPLANT
TOWEL OR 17X26 4PK STRL BLUE (TOWEL DISPOSABLE) ×4 IMPLANT
TUBING CONNECTING 10 (TUBING) ×2 IMPLANT
WRENCH HEX 7.6 (SPINAL CORD STIMULATOR) ×2 IMPLANT

## 2020-02-17 NOTE — Interval H&P Note (Signed)
History and Physical Interval Note:  02/17/2020 9:20 AM  Kara West  has presented today for surgery, with the diagnosis of CHRONIC PAIN G89.4.  The various methods of treatment have been discussed with the patient and family. After consideration of risks, benefits and other options for treatment, the patient has consented to  Procedure(s): [place] (N/A) as a surgical intervention.  The patient's history has been reviewed, patient examined, no change in status, stable for surgery.  I have reviewed the patient's chart and labs.  Questions were answered to the patient's satisfaction.     [name]

## 2020-02-17 NOTE — Anesthesia Preprocedure Evaluation (Signed)
Anesthesia Evaluation  Patient identified by MRN, date of birth, ID band Patient awake    Reviewed: Allergy & Precautions, NPO status , Patient's Chart, lab work & pertinent test results  History of Anesthesia Complications Negative for: history of anesthetic complications  Airway Mallampati: II  TM Distance: >3 FB Neck ROM: Full    Dental  (+) Teeth Intact, Chipped   Pulmonary neg sleep apnea, COPD,  COPD inhaler, Patient abstained from smoking.Not current smoker, former smoker,  Never hospitalized for COPD, uses inhalers about once a week   Pulmonary exam normal breath sounds clear to auscultation       Cardiovascular Exercise Tolerance: Good METS(-) hypertension(-) CAD and (-) Past MI negative cardio ROS  (-) dysrhythmias  Rhythm:Regular Rate:Normal - Systolic murmurs    Neuro/Psych  Neuromuscular disease negative psych ROS   GI/Hepatic GERD  ,(+)     (-) substance abuse  ,   Endo/Other  neg diabetes  Renal/GU negative Renal ROS     Musculoskeletal   Abdominal   Peds  Hematology   Anesthesia Other Findings Past Medical History: No date: Anemia     Comment:  s/p transfusion No date: Bronchitis No date: COPD (chronic obstructive pulmonary disease) ([facility]) No date: GERD (gastroesophageal reflux disease) 10/12/2011: History of Papanicolaou smear of cervix     Comment:  neg  Reproductive/Obstetrics                             Anesthesia Physical Anesthesia Plan  ASA: II  Anesthesia Plan: General   Post-op Pain Management:    Induction: Intravenous  PONV Risk Score and Plan: 3 and Ondansetron, Dexamethasone, Propofol infusion, TIVA and Midazolam  Airway Management Planned: Oral ETT  Additional Equipment: None  Intra-op Plan:   Post-operative Plan: Extubation in OR  Informed Consent: I have reviewed the patients History and Physical, chart, labs and discussed the  procedure including the risks, benefits and alternatives for the proposed anesthesia with the patient or authorized representative who has indicated his/her understanding and acceptance.     Dental advisory given  Plan Discussed with: CRNA and Surgeon  Anesthesia Plan Comments: (Discussed risks of anesthesia with patient, including PONV, sore throat, lip/dental damage. Rare risks discussed as well, such as cardiorespiratory and neurological sequelae. Patient understands.)        Anesthesia Quick Evaluation

## 2020-02-17 NOTE — Anesthesia Procedure Notes (Signed)
Procedure Name: Intubation Performed by: Fletcher-Harrison, Harnoor Kohles, CRNA Pre-anesthesia Checklist: Patient identified, Emergency Drugs available, Suction available and Patient being monitored Patient Re-evaluated:Patient Re-evaluated prior to induction Oxygen Delivery Method: Circle system utilized Preoxygenation: Pre-oxygenation with 100% oxygen Induction Type: IV induction Ventilation: Mask ventilation without difficulty Tube type: Oral Tube size: 7.0 mm Number of attempts: 1 Airway Equipment and Method: Stylet and Oral airway Placement Confirmation: ETT inserted through vocal cords under direct vision,  positive ETCO2,  breath sounds checked- equal and bilateral and CO2 detector Secured at: 21 cm Tube secured with: Tape Dental Injury: Teeth and Oropharynx as per pre-operative assessment        

## 2020-02-17 NOTE — Discharge Summary (Signed)
Procedure: implantation of SCS Procedure date: 02/17/2020 Diagnosis: chronic pain, foot and leg pain     History: Kara West is s/p implantation of SCS. POD0: Tolerated procedure well. Evaluated in post op recovery still disoriented from anesthesia but able to answer questions and obey commands.   Physical Exam: Vitals:   02/17/20 0735  BP: 121/80  Pulse: 69  Resp: 16  Temp: (!) 96.6 F (35.9 C)  SpO2: 100%    General: Alert and oriented, lying in bed Strength:5/5 throughout  Sensation: intact and symmetric throughout  Skin: incisions clean, dry, intact  Data:  No results for input(s): NA, K, CL, CO2, BUN, CREATININE, LABGLOM, GLUCOSE, CALCIUM in the last 168 hours. No results for input(s): AST, ALT, ALKPHOS in the last 168 hours.  Invalid input(s): TBILI   No results for input(s): WBC, HGB, HCT, PLT in the last 168 hours. No results for input(s): APTT, INR in the last 168 hours.       Assessment/Plan:  Kara West is POD0 s/p SCS implantation.   Once she can tolerate PO, ambulate, and urinate, she will be discharged to home. She will f/u in two weeks with me.  Kara Face, NP Department of Neurosurgery

## 2020-02-17 NOTE — H&P (Signed)
Kara West is an 44 y.o. female.   Chief Complaint: Back and Foot Pain HPI: Kara West returns today after having recently undergone a spinal cord stimulator trial for chronic foot and leg pain. She states that with the stimulator she got over 80% relief and was able to get back to normal activity. Since having the stimulator removed, the pain is returned within a day or 2. She is currently having very severe pain. She had no complications with the trial and tolerated it well. She denies any new numbness or weakness.    She is ready to proceed with permanent implant  Past Medical History:  Diagnosis Date  . Anemia    s/p transfusion  . Bronchitis   . COPD (chronic obstructive pulmonary disease) ([place])   . GERD (gastroesophageal reflux disease)   . History of Papanicolaou smear of cervix 10/12/2011   neg    Past Surgical History:  Procedure Laterality Date  . CESAREAN SECTION     x3  . DILATION AND CURETTAGE OF UTERUS  11/11/2011   hysteroscopy  . ENDOMETRIAL ABLATION  11/11/2011  . TUBAL LIGATION  1997    Family History  Problem Relation Age of Onset  . Hypertension Mother   . Diabetes Mother    Social History:  reports that she quit smoking about 5 years ago. Her smoking use included cigarettes. She has a 1.00 pack-year smoking history. She has never used smokeless tobacco. She reports current alcohol use. She reports that she does not use drugs.  Allergies: No Known Allergies  Medications Prior to Admission  Medication Sig Dispense Refill  . albuterol (VENTOLIN HFA) 108 (90 Base) MCG/ACT inhaler Inhale 2 puffs into the lungs every 6 (six) hours as needed for wheezing or shortness of breath. 8 g 1  . Fluticasone-Salmeterol (ADVAIR DISKUS) 100-50 MCG/DOSE AEPB Inhale 1 puff into the lungs in the morning and at bedtime.    . gabapentin (NEURONTIN) 600 MG tablet Take 1 tablet (600 mg total) by mouth 3 (three) times daily. 90 tablet 2  . amitriptyline (ELAVIL) 50 MG  tablet Take 1.5 tablets (75 mg total) by mouth at bedtime. (Patient not taking: Reported on 01/30/2020) 30 tablet 2  . azithromycin (ZITHROMAX) 250 MG tablet Take 1 a day for 4 days (Patient not taking: Reported on 01/30/2020) 4 each 0  . chlorpheniramine-HYDROcodone (TUSSIONEX PENNKINETIC ER) 10-8 MG/5ML SUER Take 5 mLs by mouth 2 (two) times daily. (Patient not taking: Reported on 01/22/2020) 70 mL 0  . diclofenac (VOLTAREN) 75 MG EC tablet Take 75 mg by mouth 2 (two) times daily. (Patient not taking: Reported on 01/30/2020)    . DULoxetine (CYMBALTA) 60 MG capsule TAKE 1 CAPSULE BY MOUTH ONCE DAILY FOR MOOD (Patient not taking: Reported on 01/30/2020)    . ferrous sulfate 325 (65 FE) MG tablet Take by mouth. (Patient not taking: Reported on 01/30/2020)    . Fremanezumab-vfrm (AJOVY) 225 MG/1.5ML SOSY Inject into the skin. (Patient not taking: Reported on 01/30/2020)    . meloxicam (MOBIC) 15 MG tablet Take 15 mg by mouth daily. (Patient not taking: Reported on 01/30/2020)    . predniSONE (DELTASONE) 20 MG tablet 3 tablets daily x 4 days (Patient not taking: Reported on 01/30/2020) 12 tablet 0  . rizatriptan (MAXALT) 10 MG tablet TAKE ONE TABLET BY MOUTH ONCE AS NEEDED FOR MIGRAINE. MAY TAKE A SECOND DOSE AFTER 2 HOURS IF NEEDED. (Patient not taking: Reported on 01/30/2020)    . Spacer/Aero-Holding Chambers (AEROCHAMBER  MV) inhaler Use as instructed 1 each 0  . thiamine 100 MG tablet Take 100 mg by mouth daily.  (Patient not taking: Reported on 01/30/2020)    . topiramate (TOPAMAX) 50 MG tablet TAKE 1 2 (ONE HALF) TABLET BY MOUTH NIGHTLY FOR 14 DAYS THEN INCREASE TO 1 TABLET ONCE DAILY AT NIGHT (Patient not taking: Reported on 01/30/2020)      Results for orders placed or performed during the hospital encounter of 02/17/20 (from the past 48 hour(s))  Pregnancy, urine POC     Status: None   Collection Time: 02/17/20  7:42 AM  Result Value Ref Range   Preg Test, Ur NEGATIVE NEGATIVE    Comment:        THE  SENSITIVITY OF THIS METHODOLOGY IS >24 mIU/mL    No results found.  Review of Systems General ROS: Negative Respiratory ROS: Negative Cardiovascular ROS: Negative Gastrointestinal ROS: Negative Genito-Urinary ROS: Negative Musculoskeletal ROS: Negative for back pain Neurological ROS: Positive for bilateral leg pain, negative for numbness Dermatological ROS: Negative  Blood pressure 121/80, pulse 69, temperature (!) 96.6 F (35.9 C), temperature source Tympanic, resp. rate 16, last menstrual period 02/05/2020, SpO2 100 %. Physical Exam  General appearance: Alert, cooperative, in no acute distress Back: No tenderness to palpation over the thoracic or lumbar region CV: Regular rate and rhythm Pulm: Clear to auscultation  Neurologic exam:  Mental status: alertness: alert, affect: normal Speech: fluent and clear Motor:strength symmetric 5/5 in bilateral hip flexion, knee flexion, knee extension, dorsiflexion, plantarflexion Sensory: intact to light touch in bilateral lower extremities Gait: normal   Assessment/Plan Proceed with thoracic SCS percutaneous leads and pulse generator  Kara Perla, MD 02/17/2020, 9:18 AM

## 2020-02-17 NOTE — Discharge Instructions (Signed)
DO not take antiinflammatories for at least two weeks after surgery until we clear you.    * Do not take anti-inflammatory medications for 3 days after surgery (naproxen [Aleve], ibuprofen [Advil, Motrin], celecoxib [Celebrex], etc.)  Activity    No bending, lifting, or twisting ("BLT"). Avoid lifting objects heavier than 10 pounds (gallon milk jug).  Where possible, avoid household activities that involve lifting, bending, pushing, or pulling such as laundry, vacuuming, grocery shopping, and childcare. Try to arrange for help from friends and family for these activities while your back heals.  Increase physical activity slowly as tolerated.  Taking short walks is encouraged, but avoid strenuous exercise. Do not jog, run, bicycle, lift weights, or participate in any other exercises unless specifically allowed by your doctor. Avoid prolonged sitting, including car rides.  Talk to your doctor before resuming sexual activity.  You should not drive until cleared by your doctor.  Until released by your doctor, you should not return to work or school.  You should rest at home and let your body heal.   You may shower two days after your surgery.  After showering, lightly dab your incision dry. Do not take a tub bath or go swimming for 3 weeks, or until approved by your doctor at your follow-up appointment.  If you smoke, we strongly recommend that you quit.  Smoking has been proven to interfere with normal healing in your back and will dramatically reduce the success rate of your surgery. Please contact [facility] (800-QUIT-NOW) and use the resources at www.[facility].com for assistance in stopping smoking.  Surgical Incision   If you have a dressing on your incision, you may remove it three days after your surgery. Keep your incision area clean and dry.  If you have staples or stitches on your incision, you should have a follow up scheduled for removal. If you do not have staples or stitches, you  will have steri-strips (small pieces of surgical tape) or Dermabond glue. The steri-strips/glue should begin to peel away within about a week (it is fine if the steri-strips fall off before then). If the strips are still in place one week after your surgery, you may gently remove them.  Diet            You may return to your usual diet. Be sure to stay hydrated.  When to Contact us  Although your surgery and recovery will likely be uneventful, you may have some residual numbness, aches, and pains in your back and/or legs. This is normal and should improve in the next few weeks.  However, should you experience any of the following, contact us immediately: . New numbness or weakness . Pain that is progressively getting worse, and is not relieved by your pain medications or rest . Bleeding, redness, swelling, pain, or drainage from surgical incision . Chills or flu-like symptoms . Fever greater than 101.0 F (38.3 C) . Problems with bowel or bladder functions . Difficulty breathing or shortness of breath . Warmth, tenderness, or swelling in your calf  Contact Information . During office hours (Monday-Friday 9 am to 5 pm), please call your physician at [phone] . After hours and weekends, please call [phone] and an answering service will put you in touch with either Dr. Lacinda Axon or Dr. Izora Ribas.  . For a life-threatening emergency, call [facility]   1) The drugs that you were given will stay in your system until tomorrow so for the  next 24 hours you should not:  A) Drive an automobile B) Make any legal decisions C) Drink any alcoholic beverage   2) You may resume regular meals tomorrow.  Today it is better to start with liquids and gradually work up to solid foods.  You may eat anything you prefer, but it is better to start with liquids, then soup and crackers, and gradually work up to solid foods.   3) Please notify your doctor  immediately if you have any unusual bleeding, trouble breathing, redness and pain at the surgery site, drainage, fever, or pain not relieved by medication. 4)   5) Your post-operative visit with Dr.                                     is: Date:                        Time:    Please call to schedule your post-operative visit.  6) Additional Instructions:

## 2020-02-17 NOTE — Anesthesia Postprocedure Evaluation (Signed)
Anesthesia Post Note  Patient: Loss adjuster, chartered  Procedure(s) Performed: THORACIC SPINAL CORD STIMULATOR PERCUTANEOUS WITH FLANK PULSE GENERATOR (N/A )  Patient location during evaluation: PACU Anesthesia Type: General Level of consciousness: awake and alert Pain management: pain level controlled Vital Signs Assessment: post-procedure vital signs reviewed and stable Respiratory status: spontaneous breathing, nonlabored ventilation, respiratory function stable and patient connected to nasal cannula oxygen Cardiovascular status: blood pressure returned to baseline and stable Postop Assessment: no apparent nausea or vomiting Anesthetic complications: no   No complications documented.   Last Vitals:  Vitals:   02/17/20 1358 02/17/20 1437  BP:    Pulse: (!) 59 71  Resp: 16 16  Temp:  (!) 36.2 C  SpO2: 100% 100%    Last Pain:  Vitals:   02/17/20 1437  TempSrc: Temporal  PainSc: 6                  Arita Miss

## 2020-02-17 NOTE — Progress Notes (Signed)
   02/17/20 0725  Clinical Encounter Type  Visited With Patient  Visit Type Initial  Referral From Chaplain  Consult/Referral To Chaplain  While rounding SDS, chaplain briefly spoke with pt and wished her well.

## 2020-02-17 NOTE — Transfer of Care (Signed)
Immediate Anesthesia Transfer of Care Note  Patient: Kara West  Procedure(s) Performed: THORACIC SPINAL CORD STIMULATOR PERCUTANEOUS WITH FLANK PULSE GENERATOR (N/A )  Patient Location: PACU  Anesthesia Type:General  Level of Consciousness: drowsy  Airway & Oxygen Therapy: Patient Spontanous Breathing and Patient connected to face mask oxygen  Post-op Assessment: Post -op Vital signs reviewed and stable  Post vital signs: stable  Last Vitals:  Vitals Value Taken Time  BP 115/61 02/17/20 1237  Temp    Pulse 76 02/17/20 1238  Resp    SpO2 100 % 02/17/20 1238  Vitals shown include unvalidated device data.  Last Pain:  Vitals:   02/17/20 0735  TempSrc: Tympanic  PainSc: 7          Complications: No complications documented.

## 2020-02-18 ENCOUNTER — Encounter: Payer: Self-pay | Admitting: Neurosurgery

## 2020-02-18 NOTE — Op Note (Signed)
Operative Note  SURGERY DATE:02/18/20  PRE-OP DIAGNOSIS:Chronic pain syndrome  POST-OP DIAGNOSIS:Chronic pain syndrome  Procedure(s) with comments Bilateral percutaneous spinal cord stimulator lead placement Right flank pulse generator  SURGEON:  * Malen Gauze, MD Lonell Face, NP, Asst  ANESTHESIA:General  OPERATIVE FINDINGS:Successful placement of SCS leads and pulse generator  Indication Ms. McCollumwas seen in clinic on8/10with ongoing back and foot pain. MRI of the spine revealed no concerning stenosis at the level of the implant. The patient underwent a spinal cord stimulator trial previouslyand had great relief of her pain. She therefore wanted to proceed with the permanent implant.Risks including weakness, hematoma, infection, failure of pain relief, post-operative pain, stroke, heart attack, pneumonia, and spinal cord injury were discussed.We discussed using neuro monitoring to prevent any neurologic deficits.  Procedure The patient was brought to the operating room where vascular access was obtained andshe was intubated by the anesthesia service. Neuro monitoring electrodes were placed and baseline MEP's and SSEPs were normal. She wasplaced prone on gel rolls. Antibiotics were given.Fluoroscopy was used to confirm planned incision inlumbar area at the level of L3in the midline. In addition incision was planned in the right flank for the pulse generator placement. The patient was prepped and draped in a sterile fashion. A hard time out was performed. Local anesthetic was instilled intoplannedincisionsites.   The midline lumbar incision was opened and taken to the fascia using cautery.Next, a Touhy needle was used to insert in a paramedian approach to enter the interlaminar space between L1 and L2. Once loss of resistance was identified, this was confirmed with a metal stylette. Next, the percutaneous lead was passed in the  rostral direction using fluoroscopy as guidance to keep in the midline. This was passed without resistance to the level of the T12 vertebral body on the left. Lateral views were obtained to confirm we were in the dorsal epidural space. Next, the same procedure was performed contralaterally. On entry of the tuohy needle at L2/3, CSF was seen and therefore next L1/2 was attempted and no Csf was seen prior to placement of lead. We placed an additional lead on the right at T12/L1, slightly offset to the previous lead to allow for better coverage.The leads were secured with anchors in the fascia. The incision was irrigated and hemostasis obtained.   Next, the right flank incision was opened and taken to the fascia and inferior dissection to allow for a large enough pocket for the placement of the battery. The incision was irrigated and hemostasis obtained. Next, a tunneler was used to pass the electrode leads from the lumbar incision to the right flank. There, the electrodes were attached to the pulse generator and impedances were found to be normal. The pulse generator was placed into the incision taking care to place the wires beneath the pulse generator.  A final fluoroscopic image was taken show good placement ofpercutaneous leads.MEPS and SSEPS remained stable throughout the case. Vancomycin powder was placed into the incisions. Next, acombinationof 0,2-0  Vicryls were used to close the incisions with Dermabond on the skin.  Sterile dressings were applied. The patient was returned to supine positionand the patient was seen to be moving all extremities symmetrically and was taken to PACU for recovery. The family was updated and all questions answered.   ESTIMATED BLOOD LOSS: 20cc  SPECIMENS None  IMPLANT Implants  Lauraine Rinne [id number]  Inventory Item: ANCHOR CLIK X Serial no.:  Model/Cat no.: [id number]  Implant name: Lauraine Rinne - [id number]  Laterality: N/A Area: Spine  Thoracic  Manufacturer: Wilkie Aye Date of Manufacture:    Action: Implanted Number Used: 1   Device Identifier:  Device Identifier Type:    KIT LEAD 50CM - [id number]  Inventory Item: KIT LEAD 50CM Serial no.:  Model/Cat no.: [id number]  Implant name: KIT LEAD 50CM - [id number] Laterality: N/A Area: Spine Thoracic  Manufacturer: [employer] Date of Manufacture:    Action: Implanted Number Used: 1   Device Identifier:  Device Identifier Type:    KIT LEAD 50CM - [id number]  Inventory Item: KIT LEAD 50CM Serial no.: [id number] Model/Cat no.: [id number]  Implant name: KIT LEAD 50CM - [id number] Laterality: N/A Area: Spine Thoracic  Manufacturer: [employer] Date of Manufacture:    Action: Implanted Number Used: 1   Device Identifier:  Device Identifier Type:    Clik Anchor  Inventory Item:  Serial no.:  Model/Cat no.: [id number]  Implant name: Clik Anchor Laterality: N/A Area: Spine Thoracic  Manufacturer: [employer] Date of Manufacture:    Action: Implanted Number Used: 1   Device Identifier:  Device Identifier Type:    KIT IPG ALPHA WAVEWRITER - [id number]  Inventory Item: KIT IPG ALPHA WAVEWRITER Serial no.: [id number] Model/Cat no.: [id number]  Implant name: KIT IPG ALPHA WAVEWRITER - [id number] Laterality: N/A Area: Spine Thoracic  Manufacturer: Wilkie Aye Date of Manufacture:    Action: Implanted Number Used: 1   Device Identifier:  Device Identifier Type:        I performed the case in its entiretywith assistance of NP, Christine Zdeb  [name], [place]

## 2020-02-19 LAB — TYPE AND SCREEN
ABO/RH(D): A POS
Antibody Screen: POSITIVE
Unit division: 0
Unit division: 0

## 2020-02-19 LAB — BPAM RBC
Blood Product Expiration Date: 202109242359
Blood Product Expiration Date: 202109302359
Unit Type and Rh: 6200
Unit Type and Rh: 6200

## 2020-07-01 ENCOUNTER — Other Ambulatory Visit: Payer: Self-pay

## 2020-07-01 ENCOUNTER — Encounter: Payer: Self-pay | Admitting: Emergency Medicine

## 2020-07-01 ENCOUNTER — Emergency Department
Admission: EM | Admit: 2020-07-01 | Discharge: 2020-07-01 | Disposition: A | Discharge status: Home / Self Care | Payer: HRSA Program | Attending: Emergency Medicine | Admitting: Emergency Medicine

## 2020-07-01 DIAGNOSIS — J449 Chronic obstructive pulmonary disease, unspecified: Secondary | ICD-10-CM | POA: Diagnosis not present

## 2020-07-01 DIAGNOSIS — B349 Viral infection, unspecified: Secondary | ICD-10-CM | POA: Diagnosis not present

## 2020-07-01 DIAGNOSIS — U071 COVID-19: Secondary | ICD-10-CM | POA: Diagnosis not present

## 2020-07-01 DIAGNOSIS — Z87891 Personal history of nicotine dependence: Secondary | ICD-10-CM | POA: Insufficient documentation

## 2020-07-01 DIAGNOSIS — R509 Fever, unspecified: Secondary | ICD-10-CM | POA: Diagnosis present

## 2020-07-01 LAB — SARS CORONAVIRUS 2 (TAT 6-24 HRS): SARS Coronavirus 2: POSITIVE — AB

## 2020-07-01 NOTE — ED Triage Notes (Signed)
C/O fever, body aches since Monday.  Has received 2 Covid vaccines and was scheduled to receive booster tomorrow.  AAOx3.  Skin warm and dry. NAD

## 2020-07-01 NOTE — ED Provider Notes (Signed)
[facility] Emergency Department Provider Note  ____________________________________________  Time seen: Approximately 11:41 AM  I have reviewed the triage vital signs and the nursing notes.   HISTORY  Chief Complaint Fever and Generalized Body Aches   HPI Kara West is a 45 y.o. female who presents to the emergency department for treatment and evaluation of fever, body aches, and headache since Monday. No relief with OTC medications.   Past Medical History:  Diagnosis Date  . Anemia    s/p transfusion  . Bronchitis   . COPD (chronic obstructive pulmonary disease) ([place])   . GERD (gastroesophageal reflux disease)   . History of Papanicolaou smear of cervix 10/12/2011   neg    Patient Active Problem List   Diagnosis Date Noted  . Neuropathic pain of left lower extremity 10/23/2019  . Chronic pain syndrome 04/16/2019  . Chronic radicular lumbar pain 04/16/2019  . Bilateral leg pain 11/14/2018  . Leg pain 07/11/2018  . Lymphedema 07/11/2018  . Menorrhagia with regular cycle 03/06/2018  . Bilateral lower extremity edema 12/18/2017  . Bilateral cellulitis of lower leg 12/18/2017  . Sensory polyneuropathy 08/07/2017  . Paresthesia of both feet 02/16/2017  . Moderate Persistent Asthma 02/18/2015  . Allergic rhinitis 02/18/2015  . COPD exacerbation ([place]) 12/01/2014    Past Surgical History:  Procedure Laterality Date  . CESAREAN SECTION     x3  . DILATION AND CURETTAGE OF UTERUS  11/11/2011   hysteroscopy  . ENDOMETRIAL ABLATION  11/11/2011  . THORACIC LAMINECTOMY FOR SPINAL CORD STIMULATOR N/A 02/17/2020   Procedure: THORACIC SPINAL CORD STIMULATOR PERCUTANEOUS WITH FLANK PULSE GENERATOR;  Surgeon: [name], MD;  Location: [facility] ORS;  Service: Neurosurgery;  Laterality: N/A;  . TUBAL LIGATION  1997    Prior to Admission medications   Medication Sig Start Date End Date Taking? Authorizing Provider  albuterol (VENTOLIN HFA) 108 (90  Base) MCG/ACT inhaler Inhale 2 puffs into the lungs every 6 (six) hours as needed for wheezing or shortness of breath. 12/13/19   Alfred Levins, [place], MD  amitriptyline (ELAVIL) 50 MG tablet Take 1.5 tablets (75 mg total) by mouth at bedtime. Patient not taking: Reported on 01/30/2020 10/02/19   Gillis Santa, MD  azithromycin (ZITHROMAX) 250 MG tablet Take 1 a day for 4 days Patient not taking: Reported on 01/30/2020 12/13/19   Rudene Re, MD  chlorpheniramine-HYDROcodone [facility]) 10-8 MG/5ML SUER Take 5 mLs by mouth 2 (two) times daily. Patient not taking: Reported on 01/22/2020 11/23/19   Paulette Blanch, MD  DULoxetine (CYMBALTA) 60 MG capsule TAKE 1 CAPSULE BY MOUTH ONCE DAILY FOR MOOD Patient not taking: Reported on 01/30/2020 11/09/18   [provider]  ferrous sulfate 325 (65 FE) MG tablet Take by mouth. Patient not taking: Reported on 01/30/2020    [provider]  Fluticasone-Salmeterol (ADVAIR DISKUS) 100-50 MCG/DOSE AEPB Inhale 1 puff into the lungs in the morning and at bedtime. 10/23/19   [provider]  Fremanezumab-vfrm (AJOVY) 225 MG/1.5ML SOSY Inject into the skin. Patient not taking: Reported on 01/30/2020 12/07/18   [provider]  gabapentin (NEURONTIN) 600 MG tablet Take 1 tablet (600 mg total) by mouth 3 (three) times daily. 04/16/19 02/17/20  Gillis Santa, MD  rizatriptan (MAXALT) 10 MG tablet TAKE ONE TABLET BY MOUTH ONCE AS NEEDED FOR MIGRAINE. MAY TAKE A SECOND DOSE AFTER 2 HOURS IF NEEDED. Patient not taking: Reported on 01/30/2020 12/09/18   [provider]  Spacer/Aero-Holding Chambers (AEROCHAMBER MV)  inhaler Use as instructed 02/18/15   Juanito Doom, MD  thiamine 100 MG tablet Take 100 mg by mouth daily.  Patient not taking: Reported on 01/30/2020    [provider]  topiramate (TOPAMAX) 50 MG tablet TAKE 1 2 (ONE HALF) TABLET BY MOUTH NIGHTLY FOR 14 DAYS THEN INCREASE TO 1 TABLET ONCE DAILY AT  NIGHT Patient not taking: Reported on 01/30/2020 01/10/19   [provider]    Allergies Patient has no known allergies.  Family History  Problem Relation Age of Onset  . Hypertension Mother   . Diabetes Mother     Social History Social History   Tobacco Use  . Smoking status: Former Smoker    Packs/day: 0.20    Years: 5.00    Pack years: 1.00    Types: Cigarettes    Quit date: 06/11/2014    Years since quitting: 6.0  . Smokeless tobacco: Never Used  Vaping Use  . Vaping Use: Never used  Substance Use Topics  . Alcohol use: Yes    Alcohol/week: 0.0 standard drinks    Comment: occassional  . Drug use: No    Review of Systems Constitutional: Negative for fever/chills. Decreased appetite. ENT: No sore throat. Cardiovascular: Denies chest pain. Respiratory: Negative for shortness of breath. Positive for cough. Negative wheezing.  Gastrointestinal: No nausea,  no vomiting.  no diarrhea.  Musculoskeletal: Positive for body aches Skin: Negative for rash. Neurological: Positive for headaches ____________________________________________   PHYSICAL EXAM:  VITAL SIGNS: ED Triage Vitals  Enc Vitals Group     BP 07/01/20 1003 109/87     Pulse Rate 07/01/20 1003 (!) 104     Resp 07/01/20 1003 20     Temp 07/01/20 1003 98.8 F (37.1 C)     Temp Source 07/01/20 1003 Oral     SpO2 07/01/20 1003 96 %     Weight 07/01/20 1001 240 lb 1.3 oz (108.9 kg)     Height 07/01/20 1001 5\' 9"  (1.753 m)     Head Circumference --      Peak Flow --      Pain Score 07/01/20 1001 0     Pain Loc --      Pain Edu? --      Excl. in [place]? --     Constitutional: Alert and oriented. Overall well appearing and in no acute distress. Eyes: Conjunctivae are normal. Ears: TM normal Nose: No sinus congestion noted; no rhinnorhea. Mouth/Throat: Mucous membranes are moist.  Oropharynx clear. Tonsils normal. Uvula midline. Neck: No stridor.  Lymphatic: No cervical  lymphadenopathy. Cardiovascular: Normal rate, regular rhythm. Good peripheral circulation. Respiratory: Respirations are even and unlabored.  No retractions. Breath sounds clear to auscultation. Gastrointestinal: Soft and nontender.  Musculoskeletal: FROM x 4 extremities.  Neurologic:  Normal speech and language. Skin:  Skin is warm, dry and intact. No rash noted. Psychiatric: Mood and affect are normal. Speech and behavior are normal.  ____________________________________________   LABS (all labs ordered are listed, but only abnormal results are displayed)  Labs Reviewed  SARS CORONAVIRUS 2 (TAT 6-24 HRS) - Abnormal; Notable for the following components:      Result Value   SARS Coronavirus 2 POSITIVE (*)    All other components within normal limits   ____________________________________________  EKG  Not indicated. ____________________________________________  RADIOLOGY  Not indicated. ____________________________________________   PROCEDURES  Procedure(s) performed: None  Critical Care performed: No ____________________________________________   INITIAL IMPRESSION / ASSESSMENT AND PLAN / ED COURSE  45 y.o. female presents to the ER for symptoms concerning for COVID. She will be tested and given quarantine instructions. She is to follow up with PCP for concerns.    Medications - No data to display  ED Discharge Orders    None       Pertinent labs & imaging results that were available during my care of the patient were reviewed by me and considered in my medical decision making (see chart for details).    If controlled substance prescribed during this visit, 12 month history viewed on the [facility] prior to issuing an initial prescription for Schedule II or III opiod. ____________________________________________   FINAL CLINICAL IMPRESSION(S) / ED DIAGNOSES  Final diagnoses:  Acute viral syndrome    Note:  This document was prepared using Dragon voice  recognition software and may include unintentional dictation errors.    Victorino Dike, FNP 07/02/20 1614    Carrie Mew, MD 07/02/20 2257

## 2020-08-24 ENCOUNTER — Other Ambulatory Visit: Payer: Self-pay

## 2020-08-24 ENCOUNTER — Emergency Department
Admission: EM | Admit: 2020-08-24 | Discharge: 2020-08-24 | Disposition: A | Discharge status: Home / Self Care | Payer: Self-pay | Attending: Emergency Medicine | Admitting: Emergency Medicine

## 2020-08-24 ENCOUNTER — Emergency Department: Payer: Self-pay

## 2020-08-24 DIAGNOSIS — Z87891 Personal history of nicotine dependence: Secondary | ICD-10-CM | POA: Insufficient documentation

## 2020-08-24 DIAGNOSIS — J441 Chronic obstructive pulmonary disease with (acute) exacerbation: Secondary | ICD-10-CM | POA: Insufficient documentation

## 2020-08-24 DIAGNOSIS — Z8709 Personal history of other diseases of the respiratory system: Secondary | ICD-10-CM | POA: Insufficient documentation

## 2020-08-24 LAB — BASIC METABOLIC PANEL
Anion gap: 7 (ref 5–15)
BUN: 10 mg/dL (ref 6–20)
CO2: 23 mmol/L (ref 22–32)
Calcium: 9.6 mg/dL (ref 8.9–10.3)
Chloride: 110 mmol/L (ref 98–111)
Creatinine, Ser: 0.57 mg/dL (ref 0.44–1.00)
GFR, Estimated: >60 mL/min (ref >60)
Glucose, Bld: 99 mg/dL (ref 70–99)
Potassium: 3.2 mmol/L — ABNORMAL LOW (ref 3.5–5.1)
Sodium: 140 mmol/L (ref 135–145)

## 2020-08-24 LAB — CBC WITH DIFFERENTIAL/PLATELET
Abs Immature Granulocytes: 0.01 10*3/uL (ref 0.00–0.07)
Basophils Absolute: 0 10*3/uL (ref 0.0–0.1)
Basophils Relative: 1 %
Eosinophils Absolute: 0.6 10*3/uL — ABNORMAL HIGH (ref 0.0–0.5)
Eosinophils Relative: 10 %
HCT: 39 % (ref 36.0–46.0)
Hemoglobin: 13.1 g/dL (ref 12.0–15.0)
Immature Granulocytes: 0 %
Lymphocytes Relative: 37 %
Lymphs Abs: 2.3 10*3/uL (ref 0.7–4.0)
MCH: 29.6 pg (ref 26.0–34.0)
MCHC: 33.6 g/dL (ref 30.0–36.0)
MCV: 88.2 fL (ref 80.0–100.0)
Monocytes Absolute: 0.5 10*3/uL (ref 0.1–1.0)
Monocytes Relative: 7 %
Neutro Abs: 2.8 10*3/uL (ref 1.7–7.7)
Neutrophils Relative %: 45 %
Platelets: 210 10*3/uL (ref 150–400)
RBC: 4.42 MIL/uL (ref 3.87–5.11)
RDW: 14.4 % (ref 11.5–15.5)
WBC: 6.2 10*3/uL (ref 4.0–10.5)
nRBC: 0 % (ref 0.0–0.2)

## 2020-08-24 MED ORDER — MAGNESIUM SULFATE 2 GM/50ML IV SOLN
2.0000 g | Freq: Once | INTRAVENOUS | Status: AC
  Administered 2020-08-24: 2 g via INTRAVENOUS
  Filled 2020-08-24: qty 50

## 2020-08-24 MED ORDER — FLUTICASONE-SALMETEROL 100-50 MCG/DOSE IN AEPB: 1.0000 | INHALATION_SPRAY | Freq: Two times a day (BID) | RESPIRATORY_TRACT | 0 refills | Status: AC

## 2020-08-24 MED ORDER — METHYLPREDNISOLONE SODIUM SUCC 125 MG IJ SOLR
125.0000 mg | Freq: Once | INTRAMUSCULAR | Status: AC
  Administered 2020-08-24: 125 mg via INTRAVENOUS
  Filled 2020-08-24: qty 2

## 2020-08-24 MED ORDER — BENZONATATE 100 MG PO CAPS: 100.0000 mg | ORAL_CAPSULE | Freq: Three times a day (TID) | ORAL | 0 refills | Status: AC | PRN

## 2020-08-24 MED ORDER — ALBUTEROL SULFATE HFA 108 (90 BASE) MCG/ACT IN AERS: 2.0000 | INHALATION_SPRAY | Freq: Four times a day (QID) | RESPIRATORY_TRACT | 1 refills | Status: DC | PRN

## 2020-08-24 MED ORDER — PREDNISONE 20 MG PO TABS: 60.0000 mg | ORAL_TABLET | Freq: Every day | ORAL | 0 refills | Status: DC

## 2020-08-24 MED ORDER — ALBUTEROL SULFATE (2.5 MG/3ML) 0.083% IN NEBU: 2.5000 mg | INHALATION_SOLUTION | RESPIRATORY_TRACT | 2 refills | Status: DC | PRN

## 2020-08-24 MED ORDER — IPRATROPIUM-ALBUTEROL 0.5-2.5 (3) MG/3ML IN SOLN
3.0000 mL | RESPIRATORY_TRACT | Status: AC
  Administered 2020-08-24 (×3): 3 mL via RESPIRATORY_TRACT
  Filled 2020-08-24: qty 6
  Filled 2020-08-24: qty 3

## 2020-08-24 NOTE — ED Provider Notes (Signed)
[facility] Emergency Department Provider Note  ____________________________________________   Event Date/Time   First MD Initiated Contact with Patient 08/24/20 0209     (approximate)  I have reviewed the triage vital signs and the nursing notes.   HISTORY  Chief Complaint Shortness of Breath    HPI Kara West is a 45 y.o. female with history of COPD who presents to the emergency department with increased shortness of breath, cough with clear sputum production since Thursday, March 3.  Has been using her albuterol inhaler and albuterol nebulizer treatments at home without much relief.  She denies any fevers, chest pain, lower extremity swelling or pain.  No history of PE, DVT, CHF.  Placed on oxygen in the waiting room for comfort.  Does not wear oxygen chronically.        Past Medical History:  Diagnosis Date  . Anemia    s/p transfusion  . Bronchitis   . COPD (chronic obstructive pulmonary disease) ([place])   . GERD (gastroesophageal reflux disease)   . History of Papanicolaou smear of cervix 10/12/2011   neg    Patient Active Problem List   Diagnosis Date Noted  . Neuropathic pain of left lower extremity 10/23/2019  . Chronic pain syndrome 04/16/2019  . Chronic radicular lumbar pain 04/16/2019  . Bilateral leg pain 11/14/2018  . Leg pain 07/11/2018  . Lymphedema 07/11/2018  . Menorrhagia with regular cycle 03/06/2018  . Bilateral lower extremity edema 12/18/2017  . Bilateral cellulitis of lower leg 12/18/2017  . Sensory polyneuropathy 08/07/2017  . Paresthesia of both feet 02/16/2017  . Moderate Persistent Asthma 02/18/2015  . Allergic rhinitis 02/18/2015  . COPD exacerbation ([place]) 12/01/2014    Past Surgical History:  Procedure Laterality Date  . CESAREAN SECTION     x3  . DILATION AND CURETTAGE OF UTERUS  11/11/2011   hysteroscopy  . ENDOMETRIAL ABLATION  11/11/2011  . THORACIC LAMINECTOMY FOR SPINAL CORD STIMULATOR  N/A 02/17/2020   Procedure: THORACIC SPINAL CORD STIMULATOR PERCUTANEOUS WITH FLANK PULSE GENERATOR;  Surgeon: [name], MD;  Location: [facility] ORS;  Service: Neurosurgery;  Laterality: N/A;  . TUBAL LIGATION  1997    Prior to Admission medications   Medication Sig Start Date End Date Taking? Authorizing Provider  albuterol (VENTOLIN HFA) 108 (90 Base) MCG/ACT inhaler Inhale 2 puffs into the lungs every 6 (six) hours as needed for wheezing or shortness of breath. 12/13/19   Alfred Levins, [place], MD  amitriptyline (ELAVIL) 50 MG tablet Take 1.5 tablets (75 mg total) by mouth at bedtime. Patient not taking: Reported on 01/30/2020 10/02/19   Gillis Santa, MD  azithromycin (ZITHROMAX) 250 MG tablet Take 1 a day for 4 days Patient not taking: Reported on 01/30/2020 12/13/19   Rudene Re, MD  chlorpheniramine-HYDROcodone [facility]) 10-8 MG/5ML SUER Take 5 mLs by mouth 2 (two) times daily. Patient not taking: Reported on 01/22/2020 11/23/19   Paulette Blanch, MD  DULoxetine (CYMBALTA) 60 MG capsule TAKE 1 CAPSULE BY MOUTH ONCE DAILY FOR MOOD Patient not taking: Reported on 01/30/2020 11/09/18   [provider]  ferrous sulfate 325 (65 FE) MG tablet Take by mouth. Patient not taking: Reported on 01/30/2020    [provider]  Fluticasone-Salmeterol (ADVAIR DISKUS) 100-50 MCG/DOSE AEPB Inhale 1 puff into the lungs in the morning and at bedtime. 10/23/19   [provider]  Fremanezumab-vfrm (AJOVY) 225 MG/1.5ML SOSY Inject into the skin. Patient not taking: Reported on 01/30/2020 12/07/18  [provider]  gabapentin (NEURONTIN) 600 MG tablet Take 1 tablet (600 mg total) by mouth 3 (three) times daily. 04/16/19 02/17/20  Gillis Santa, MD  rizatriptan (MAXALT) 10 MG tablet TAKE ONE TABLET BY MOUTH ONCE AS NEEDED FOR MIGRAINE. MAY TAKE A SECOND DOSE AFTER 2 HOURS IF NEEDED. Patient not taking: Reported on 01/30/2020 12/09/18   [provider]   Spacer/Aero-Holding Chambers (AEROCHAMBER MV) inhaler Use as instructed 02/18/15   Juanito Doom, MD  thiamine 100 MG tablet Take 100 mg by mouth daily.  Patient not taking: Reported on 01/30/2020    [provider]  topiramate (TOPAMAX) 50 MG tablet TAKE 1 2 (ONE HALF) TABLET BY MOUTH NIGHTLY FOR 14 DAYS THEN INCREASE TO 1 TABLET ONCE DAILY AT NIGHT Patient not taking: Reported on 01/30/2020 01/10/19   [provider]    Allergies Patient has no known allergies.  Family History  Problem Relation Age of Onset  . Hypertension Mother   . Diabetes Mother     Social History Social History   Tobacco Use  . Smoking status: Former Smoker    Packs/day: 0.20    Years: 5.00    Pack years: 1.00    Types: Cigarettes    Quit date: 06/11/2014    Years since quitting: 6.2  . Smokeless tobacco: Never Used  Vaping Use  . Vaping Use: Never used  Substance Use Topics  . Alcohol use: Yes    Alcohol/week: 0.0 standard drinks    Comment: occassional  . Drug use: No    Review of Systems Constitutional: No fever. Eyes: No visual changes. ENT: No sore throat. Cardiovascular: Denies chest pain. Respiratory: + shortness of breath. Gastrointestinal: No nausea, vomiting, diarrhea. Genitourinary: Negative for dysuria. Musculoskeletal: Negative for back pain. Skin: Negative for rash. Neurological: Negative for focal weakness or numbness.  ____________________________________________   PHYSICAL EXAM:  VITAL SIGNS: ED Triage Vitals  Enc Vitals Group     BP 08/24/20 0148 128/90     Pulse Rate 08/24/20 0148 97     Resp 08/24/20 0148 (!) 24     Temp 08/24/20 0148 98.1 F (36.7 C)     Temp Source 08/24/20 0148 Oral     SpO2 08/24/20 0148 97 %     Weight 08/24/20 0146 211 lb (95.7 kg)     Height 08/24/20 0146 5\' 9"  (1.753 m)     Head Circumference --      Peak Flow --      Pain Score 08/24/20 0146 0     Pain Loc --      Pain Edu? --      Excl. in [place]? --     CONSTITUTIONAL: Alert and oriented and responds appropriately to questions.  Mild to moderate respiratory distress. HEAD: Normocephalic EYES: Conjunctivae clear, pupils appear equal, EOM appear intact ENT: normal nose; moist mucous membranes NECK: Supple, normal ROM CARD: Regular and tachycardic; S1 and S2 appreciated; no murmurs, no clicks, no rubs, no gallops RESP: Patient is speaking in truncated sentences.  She is diminished diffusely with expiratory and inspiratory wheezing.  No rhonchi or rales.  Currently on 2 L nasal cannula for comfort.  She is in mild to moderate respiratory distress.  She is tachypneic. ABD/GI: Normal bowel sounds; non-distended; soft, non-tender, no rebound, no guarding, no peritoneal signs, no hepatosplenomegaly BACK: The back appears normal EXT: Normal ROM in all joints; no deformity noted, no edema; no cyanosis, no calf tenderness or calf swelling SKIN: Normal  color for age and race; warm; no rash on exposed skin NEURO: Moves all extremities equally PSYCH: The patient's mood and manner are appropriate.  ____________________________________________   LABS (all labs ordered are listed, but only abnormal results are displayed)  Labs Reviewed  CBC WITH DIFFERENTIAL/PLATELET - Abnormal; Notable for the following components:      Result Value   Eosinophils Absolute 0.6 (*)    All other components within normal limits  BASIC METABOLIC PANEL - Abnormal; Notable for the following components:   Potassium 3.2 (*)    All other components within normal limits  POC URINE PREG, ED   ____________________________________________  EKG   Date: 08/24/2020 1:56 AM  Rate: 102  Rhythm: Sinus tachycardia  QRS Axis: normal  Intervals: normal  ST/T Wave abnormalities: normal  Conduction Disutrbances: none  Narrative Interpretation: Sinus tachycardia, artifact     ____________________________________________  RADIOLOGY I, Mckinnon Glick, personally viewed and  evaluated these images (plain radiographs) as part of my medical decision making, as well as reviewing the written report by the radiologist.  ED MD interpretation: No pulmonary edema, infiltrate.  Official radiology report(s): DG Chest Portable 1 View  Result Date: 08/24/2020 CLINICAL DATA:  Shortness of breath since Thursday cough, bronchitis and COPD EXAM: PORTABLE CHEST 1 VIEW COMPARISON:  CT 12/13/2019 FINDINGS: Chronic mild hyperinflation and bronchitic changes. No consolidation, features of edema, pneumothorax, or effusion. Pulmonary vascularity is normally distributed. The cardiomediastinal contours are unremarkable. No acute osseous or soft tissue abnormality. Telemetry leads overlie the chest. Spinal nerve stimulators project over the lower thoracic levels. IMPRESSION: Chronic mild hyperinflation and bronchitic changes without acute cardiopulmonary disease. Electronically Signed   By: Lovena Le M.D.   On: 08/24/2020 02:30    ____________________________________________   PROCEDURES  Procedure(s) performed (including Critical Care):  Procedures  CRITICAL CARE Performed by: Cyril Mourning Garen Woolbright   Total critical care time: 45 minutes  Critical care time was exclusive of separately billable procedures and treating other patients.  Critical care was necessary to treat or prevent imminent or life-threatening deterioration.  Critical care was time spent personally by me on the following activities: development of treatment plan with patient and/or surrogate as well as nursing, discussions with consultants, evaluation of patient's response to treatment, examination of patient, obtaining history from patient or surrogate, ordering and performing treatments and interventions, ordering and review of laboratory studies, ordering and review of radiographic studies, pulse oximetry and re-evaluation of patient's condition.  ____________________________________________   INITIAL IMPRESSION /  ASSESSMENT AND PLAN / ED COURSE  As part of my medical decision making, I reviewed the following data within the [place] notes reviewed and incorporated, Labs reviewed , EKG interpreted tachycardia, Old EKG reviewed, Radiograph reviewed  and Notes from prior ED visits         Patient here with COPD exacerbation.  Afebrile, nontoxic-appearing but in mild to moderate respiratory distress.  Will give duo nebs, magnesium, Solu-Medrol.  Will obtain labs, chest x-ray.  EKG is nonischemic and she denies any chest pain.  Doubt ACS.  No history of CHF and does not appear volume overloaded.  Doubt PE or DVT.  No fever.  Doubt infectious etiology.  Will reassess after nebs for further disposition.  ED PROGRESS  Patient's lungs are clear with increased aeration after breathing treatments.  She has off oxygen satting 98% on room air.  Speaking full sentences and reports feeling much better.  Labs unremarkable.  Chest x-ray shows no infiltrate, edema or  pneumothorax.  I feel she is safe to be discharged home.  Will refill her albuterol inhaler, albuterol for her nebulizer and discharge on steroid burst.  At this time, I do not feel there is any life-threatening condition present. I have reviewed, interpreted and discussed all results (EKG, imaging, lab, urine as appropriate) and exam findings with patient/family. I have reviewed nursing notes and appropriate previous records.  I feel the patient is safe to be discharged home without further emergent workup and can continue workup as an outpatient as needed. Discussed usual and customary return precautions. Patient/family verbalize understanding and are comfortable with this plan.  Outpatient follow-up has been provided as needed. All questions have been answered.  ____________________________________________   FINAL CLINICAL IMPRESSION(S) / ED DIAGNOSES  Final diagnoses:  COPD exacerbation [facility])     ED Discharge Orders     None      *Please note:  Brittanee Ghazarian was evaluated in Emergency Department on 08/24/2020 for the symptoms described in the history of present illness. She was evaluated in the context of the global COVID-19 pandemic, which necessitated consideration that the patient might be at risk for infection with the SARS-CoV-2 virus that causes COVID-19. Institutional protocols and algorithms that pertain to the evaluation of patients at risk for COVID-19 are in a state of rapid change based on information released by regulatory bodies including the CDC and federal and state organizations. These policies and algorithms were followed during the patient's care in the ED.  Some ED evaluations and interventions may be delayed as a result of limited staffing during and the pandemic.*   Note:  This document was prepared using Dragon voice recognition software and may include unintentional dictation errors.   Giavanna Kang, Delice Bison, DO 08/24/20 [phone]

## 2020-08-24 NOTE — Discharge Instructions (Signed)
Please start your prednisone on the morning of 08/25/2020.

## 2020-08-24 NOTE — ED Triage Notes (Signed)
Ambulates to desk c/o SOB since Thursday, also reports cough. Decreased appetite. Hx of bronchitis and COPD. Has taken her prescribed inhalers without relief. Reports increase in SOB when lying down, exerting and talking.

## 2020-09-15 ENCOUNTER — Encounter: Payer: Self-pay | Admitting: Emergency Medicine

## 2020-09-15 ENCOUNTER — Emergency Department
Admission: EM | Admit: 2020-09-15 | Discharge: 2020-09-15 | Disposition: A | Discharge status: Home / Self Care | Payer: Medicaid Other | Attending: Emergency Medicine | Admitting: Emergency Medicine

## 2020-09-15 ENCOUNTER — Other Ambulatory Visit: Payer: Self-pay

## 2020-09-15 ENCOUNTER — Emergency Department: Payer: Medicaid Other

## 2020-09-15 DIAGNOSIS — Z79899 Other long term (current) drug therapy: Secondary | ICD-10-CM | POA: Insufficient documentation

## 2020-09-15 DIAGNOSIS — J441 Chronic obstructive pulmonary disease with (acute) exacerbation: Secondary | ICD-10-CM

## 2020-09-15 DIAGNOSIS — Z87891 Personal history of nicotine dependence: Secondary | ICD-10-CM | POA: Insufficient documentation

## 2020-09-15 DIAGNOSIS — J4540 Moderate persistent asthma, uncomplicated: Secondary | ICD-10-CM | POA: Insufficient documentation

## 2020-09-15 DIAGNOSIS — Z596 Low income: Secondary | ICD-10-CM

## 2020-09-15 LAB — CBC
HCT: 38.2 % (ref 36.0–46.0)
Hemoglobin: 12.7 g/dL (ref 12.0–15.0)
MCH: 28.9 pg (ref 26.0–34.0)
MCHC: 33.2 g/dL (ref 30.0–36.0)
MCV: 87 fL (ref 80.0–100.0)
Platelets: 246 10*3/uL (ref 150–400)
RBC: 4.39 MIL/uL (ref 3.87–5.11)
RDW: 14.2 % (ref 11.5–15.5)
WBC: 3.9 10*3/uL — ABNORMAL LOW (ref 4.0–10.5)
nRBC: 0 % (ref 0.0–0.2)

## 2020-09-15 LAB — BASIC METABOLIC PANEL
Anion gap: 6 (ref 5–15)
BUN: 13 mg/dL (ref 6–20)
CO2: 24 mmol/L (ref 22–32)
Calcium: 8.9 mg/dL (ref 8.9–10.3)
Chloride: 109 mmol/L (ref 98–111)
Creatinine, Ser: 0.71 mg/dL (ref 0.44–1.00)
GFR, Estimated: >60 mL/min (ref >60)
Glucose, Bld: 79 mg/dL (ref 70–99)
Potassium: 3.5 mmol/L (ref 3.5–5.1)
Sodium: 139 mmol/L (ref 135–145)

## 2020-09-15 LAB — POC URINE PREG, ED: Preg Test, Ur: NEGATIVE

## 2020-09-15 MED ORDER — IPRATROPIUM-ALBUTEROL 0.5-2.5 (3) MG/3ML IN SOLN
6.0000 mL | Freq: Once | RESPIRATORY_TRACT | Status: AC
  Administered 2020-09-15: 6 mL via RESPIRATORY_TRACT
  Filled 2020-09-15: qty 6

## 2020-09-15 MED ORDER — PREDNISONE 50 MG PO TABS: 50.0000 mg | ORAL_TABLET | Freq: Every day | ORAL | 0 refills | Status: AC

## 2020-09-15 MED ORDER — ALBUTEROL SULFATE (2.5 MG/3ML) 0.083% IN NEBU: 2.5000 mg | INHALATION_SOLUTION | RESPIRATORY_TRACT | 2 refills | Status: AC | PRN

## 2020-09-15 MED ORDER — FLUTICASONE FUROATE-VILANTEROL 100-25 MCG/INH IN AEPB
1.0000 | INHALATION_SPRAY | Freq: Once | RESPIRATORY_TRACT | Status: AC
  Administered 2020-09-15: 18:00:00 1 via RESPIRATORY_TRACT
  Filled 2020-09-15: qty 28

## 2020-09-15 MED ORDER — ALBUTEROL SULFATE HFA 108 (90 BASE) MCG/ACT IN AERS
2.0000 | INHALATION_SPRAY | RESPIRATORY_TRACT | Status: DC | PRN
  Administered 2020-09-15: 2 via RESPIRATORY_TRACT
  Filled 2020-09-15: qty 6.7

## 2020-09-15 MED ORDER — PREDNISONE 20 MG PO TABS
60.0000 mg | ORAL_TABLET | Freq: Once | ORAL | Status: AC
  Administered 2020-09-15: 60 mg via ORAL
  Filled 2020-09-15: qty 3

## 2020-09-15 NOTE — ED Provider Notes (Signed)
[facility] Emergency Department Provider Note ____________________________________________   Event Date/Time   First MD Initiated Contact with Patient 09/15/20 1649     (approximate)  I have reviewed the triage vital signs and the nursing notes.  HISTORY  Chief Complaint Cough   HPI Kara West is a 45 y.o. femalewho presents to the ED for evaluation of persistent cough.  Chart review indicates history of COPD prescribed Advair and albuterol. Patient was seen in our ED about 3 weeks ago on 3/7 for COPD exacerbation and discharged home with albuterol refill and steroid burst.  Patient reports improvement with prescribed medications after her last visit, but reports running out of her medications recently with worsening of the symptoms.  Due to insurance difficulties, she reports that she has not had her Advair for a couple weeks, and she ran out of her albuterol nebulizer solution the past few days.  She reports a few days of progressively worsening shortness of breath, minimally productive cough and associated central chest tightness.  Denies fevers, abdominal pain, emesis or diarrhea, syncope or trauma.  She reports her Advair is greater than $300 per month and she is unable to afford this at this time, and she is hoping to get insurance soon through her employer.  Past Medical History:  Diagnosis Date  . Anemia    s/p transfusion  . Bronchitis   . COPD (chronic obstructive pulmonary disease) ([place])   . GERD (gastroesophageal reflux disease)   . History of Papanicolaou smear of cervix 10/12/2011   neg    Patient Active Problem List   Diagnosis Date Noted  . Neuropathic pain of left lower extremity 10/23/2019  . Chronic pain syndrome 04/16/2019  . Chronic radicular lumbar pain 04/16/2019  . Bilateral leg pain 11/14/2018  . Leg pain 07/11/2018  . Lymphedema 07/11/2018  . Menorrhagia with regular cycle 03/06/2018  . Bilateral lower extremity  edema 12/18/2017  . Bilateral cellulitis of lower leg 12/18/2017  . Sensory polyneuropathy 08/07/2017  . Paresthesia of both feet 02/16/2017  . Moderate Persistent Asthma 02/18/2015  . Allergic rhinitis 02/18/2015  . COPD exacerbation ([place]) 12/01/2014    Past Surgical History:  Procedure Laterality Date  . CESAREAN SECTION     x3  . DILATION AND CURETTAGE OF UTERUS  11/11/2011   hysteroscopy  . ENDOMETRIAL ABLATION  11/11/2011  . THORACIC LAMINECTOMY FOR SPINAL CORD STIMULATOR N/A 02/17/2020   Procedure: THORACIC SPINAL CORD STIMULATOR PERCUTANEOUS WITH FLANK PULSE GENERATOR;  Surgeon: [name], MD;  Location: [facility] ORS;  Service: Neurosurgery;  Laterality: N/A;  . TUBAL LIGATION  1997    Prior to Admission medications   Medication Sig Start Date End Date Taking? Authorizing Provider  albuterol (PROVENTIL) (2.5 MG/3ML) 0.083% nebulizer solution Take 3 mLs (2.5 mg total) by nebulization every 4 (four) hours as needed for wheezing or shortness of breath. 09/15/20 09/15/21 Yes Vladimir Crofts, MD  predniSONE (DELTASONE) 50 MG tablet Take 1 tablet (50 mg total) by mouth daily for 4 days. 09/15/20 09/19/20 Yes Vladimir Crofts, MD  albuterol (PROVENTIL) (2.5 MG/3ML) 0.083% nebulizer solution Take 3 mLs (2.5 mg total) by nebulization every 4 (four) hours as needed for wheezing or shortness of breath. 08/24/20 08/24/21  Ward, Delice Bison, DO  albuterol (VENTOLIN HFA) 108 (90 Base) MCG/ACT inhaler Inhale 2 puffs into the lungs every 6 (six) hours as needed for wheezing or shortness of breath. 08/24/20   Ward, Delice Bison, DO  amitriptyline (ELAVIL) 50 MG tablet Take  1.5 tablets (75 mg total) by mouth at bedtime. Patient not taking: Reported on 01/30/2020 10/02/19   Gillis Santa, MD  benzonatate (TESSALON PERLES) 100 MG capsule Take 1 capsule (100 mg total) by mouth 3 (three) times daily as needed for cough. 08/24/20 08/24/21  Ward, Delice Bison, DO  DULoxetine (CYMBALTA) 60 MG capsule TAKE 1 CAPSULE BY MOUTH ONCE DAILY  FOR MOOD Patient not taking: Reported on 01/30/2020 11/09/18   [provider]  ferrous sulfate 325 (65 FE) MG tablet Take by mouth. Patient not taking: Reported on 01/30/2020    [provider]  Fluticasone-Salmeterol (ADVAIR DISKUS) 100-50 MCG/DOSE AEPB Inhale 1 puff into the lungs in the morning and at bedtime. 08/24/20   Ward, Delice Bison, DO  Fremanezumab-vfrm (AJOVY) 225 MG/1.5ML SOSY Inject into the skin. Patient not taking: Reported on 01/30/2020 12/07/18   [provider]  gabapentin (NEURONTIN) 600 MG tablet Take 1 tablet (600 mg total) by mouth 3 (three) times daily. 04/16/19 02/17/20  Gillis Santa, MD  predniSONE (DELTASONE) 20 MG tablet Take 3 tablets (60 mg total) by mouth daily. 08/24/20   Ward, Delice Bison, DO  rizatriptan (MAXALT) 10 MG tablet TAKE ONE TABLET BY MOUTH ONCE AS NEEDED FOR MIGRAINE. MAY TAKE A SECOND DOSE AFTER 2 HOURS IF NEEDED. Patient not taking: Reported on 01/30/2020 12/09/18   [provider]  Spacer/Aero-Holding Chambers (AEROCHAMBER MV) inhaler Use as instructed 02/18/15   Juanito Doom, MD  thiamine 100 MG tablet Take 100 mg by mouth daily.  Patient not taking: Reported on 01/30/2020    [provider]  topiramate (TOPAMAX) 50 MG tablet TAKE 1 2 (ONE HALF) TABLET BY MOUTH NIGHTLY FOR 14 DAYS THEN INCREASE TO 1 TABLET ONCE DAILY AT NIGHT Patient not taking: Reported on 01/30/2020 01/10/19   [provider]    Allergies Patient has no known allergies.  Family History  Problem Relation Age of Onset  . Hypertension Mother   . Diabetes Mother     Social History Social History   Tobacco Use  . Smoking status: Former Smoker    Packs/day: 0.20    Years: 5.00    Pack years: 1.00    Types: Cigarettes    Quit date: 06/11/2014    Years since quitting: 6.2  . Smokeless tobacco: Never Used  Vaping Use  . Vaping Use: Never used  Substance Use Topics  . Alcohol use: Yes    Alcohol/week: 0.0 standard drinks     Comment: occassional  . Drug use: No    Review of Systems  Constitutional: No fever/chills Eyes: No visual changes. ENT: No sore throat. Cardiovascular: Denies chest pain. Respiratory: Positive for shortness of breath and cough Gastrointestinal: No abdominal pain.  No nausea, no vomiting.  No diarrhea.  No constipation. Genitourinary: Negative for dysuria. Musculoskeletal: Negative for back pain. Skin: Negative for rash. Neurological: Negative for headaches, focal weakness or numbness.  ____________________________________________   PHYSICAL EXAM:  VITAL SIGNS: Vitals:   09/15/20 1930 09/15/20 2025  BP: 115/73 108/84  Pulse: 72 78  Resp: 19 16  Temp:    SpO2: 99% 97%    Constitutional: Alert and oriented. Well appearing and in no acute distress.  Speaking in full sentences without distress. Eyes: Conjunctivae are normal. PERRL. EOMI. Head: Atraumatic. Nose: No congestion/rhinnorhea. Mouth/Throat: Mucous membranes are moist.  Oropharynx non-erythematous. Neck: No stridor. No cervical spine tenderness to palpation. Cardiovascular: Normal rate, regular rhythm. Grossly normal heart sounds.  Good peripheral circulation. Respiratory: Minimal  tachypnea to the low 20s, otherwise no distress.  No retractions.  Unable to appreciate any wheezing due to very poor air movement throughout, no focal features Gastrointestinal: Soft , nondistended, nontender to palpation. No CVA tenderness. Musculoskeletal: No lower extremity tenderness nor edema.  No joint effusions. No signs of acute trauma. Neurologic:  Normal speech and language. No gross focal neurologic deficits are appreciated. No gait instability noted. Skin:  Skin is warm, dry and intact. No rash noted. Psychiatric: Mood and affect are normal. Speech and behavior are normal. ____________________________________________   LABS (all labs ordered are listed, but only abnormal results are displayed)  Labs Reviewed  CBC -  Abnormal; Notable for the following components:      Result Value   WBC 3.9 (*)    All other components within normal limits  BASIC METABOLIC PANEL  POC URINE PREG, ED   ____________________________________________  12 Lead EKG  Sinus rhythm, rate of 87 bpm.  Normal axis and intervals.  No evidence of acute ischemia. ____________________________________________  RADIOLOGY  ED MD interpretation: 2 view CXR reviewed by me without evidence of acute cardiopulmonary pathology.  Official radiology report(s): DG Chest 2 View  Result Date: 09/15/2020 CLINICAL DATA:  Shortness of breath cough EXAM: CHEST - 2 VIEW COMPARISON:  August 24, 2020 FINDINGS: Lungs are clear. Heart size and pulmonary vascularity are normal. No adenopathy. No pneumothorax. No bone lesions. Thoracic stimulator leads at the T12 level. IMPRESSION: Lungs clear.  Cardiac silhouette normal. Electronically Signed   By: Lowella Grip III M.D.   On: 09/15/2020 16:15    ____________________________________________   PROCEDURES and INTERVENTIONS  Procedure(s) performed (including Critical Care):  .1-3 Lead EKG Interpretation Performed by: Vladimir Crofts, MD Authorized by: Vladimir Crofts, MD     Interpretation: normal     ECG rate:  92   ECG rate assessment: normal     Rhythm: sinus rhythm     Ectopy: none     Conduction: normal      Medications  albuterol (VENTOLIN HFA) 108 (90 Base) MCG/ACT inhaler 2 puff (2 puffs Inhalation Given 09/15/20 1745)  fluticasone furoate-vilanterol (BREO ELLIPTA) 100-25 MCG/INH 1 puff (1 puff Inhalation Given 09/15/20 1812)  ipratropium-albuterol (DUONEB) 0.5-2.5 (3) MG/3ML nebulizer solution 6 mL (6 mLs Nebulization Given 09/15/20 1744)  predniSONE (DELTASONE) tablet 60 mg (60 mg Oral Given 09/15/20 1744)    ____________________________________________   MDM / ED COURSE   45 year old woman with COPD presents to the ED with shortness of breath and cough, with evidence of COPD  exacerbation, and ultimately amenable to outpatient management.  Normal vitals on room air.  Exam with very poor air movement and wheezing throughout her lung fields, suggestive of COPD exacerbation.  She otherwise looks well without evidence of trauma, distress or any neurovascular deficits.  CXR is clear without evidence of CAP or infectious etiology of her symptoms.  Blood work is unremarkable.  EKG is nonischemic.  I anticipate her symptoms are primarily due to running out of medications at home in the setting of her insurance and financial difficulties.  We provide her with a Brio Ellipta inhaler to take home in place of her Advair, due to our formulary here at the pharmacy, as well as prescriptions for refills of her albuterol nebulizer solution.  Provided steroid prescription as well to complete burst of steroids.  We discussed return precautions for the ED and patient is stable for outpatient management.   Clinical Course as of 09/15/20 2254  Tue Sep 15, 2020  2002 Reassessed.  Patient reports improving symptoms.  Improved airflow and auscultation and near resolution of wheezes.  Breo Ellipta was delivered to her and we discussed daily use of this as well as albuterol.  We discussed return precautions for the ED. [DS]    Clinical Course User Index [DS] Vladimir Crofts, MD    ____________________________________________   FINAL CLINICAL IMPRESSION(S) / ED DIAGNOSES  Final diagnoses:  COPD exacerbation ([place])  Patient cannot afford medications     ED Discharge Orders         Ordered    albuterol (PROVENTIL) (2.5 MG/3ML) 0.083% nebulizer solution  Every 4 hours PRN        09/15/20 2002    predniSONE (DELTASONE) 50 MG tablet  Daily        09/15/20 2005           Charnelle Bergeman   Note:  This document was prepared using Systems analyst and may include unintentional dictation errors.   Vladimir Crofts, MD 09/15/20 2256

## 2020-09-15 NOTE — ED Notes (Addendum)
Pt presents to ED with c/o of having difficulty breathing that has been ongoing "for awhile". Pt states she was seen here within the past few weeks and given medications for her cough and a course of steroids. Pt states she is getting worse after taking these medications. Pt does appear SOB when talking and pt also states a HX of COPD and asthma. Pt denies fevers or chills. Pt is A&Ox4. Pt has dry cough and states not a productive cough at this time.

## 2020-09-15 NOTE — Discharge Instructions (Addendum)
As we discussed, please use the Breo Ellipta daily inhaler in place of your Advair.  1-2 puffs once daily.   More albuterol nebulizer solution is waiting for you at the [facility].  Try not to use this more than every 4 hours.  Steroids, prednisone, is also waiting for you at the pharmacy.  Please take once daily for the next 4 days  Continue all of your other typical medications and return to the ED with any worsening symptoms despite these medications.

## 2020-09-15 NOTE — ED Triage Notes (Signed)
Pt states continued cough and SOB, states was seen recently for same symptoms.  Pt states has been using at home medications without relief. Dry cough noted in triage.

## 2020-09-15 NOTE — ED Notes (Signed)
Pt co cough. Dr Tamala Julian notified. Pt instructed to use inhalers as written in d/c.

## 2021-05-02 ENCOUNTER — Other Ambulatory Visit: Payer: Self-pay

## 2021-05-02 DIAGNOSIS — Z87891 Personal history of nicotine dependence: Secondary | ICD-10-CM | POA: Insufficient documentation

## 2021-05-02 DIAGNOSIS — M545 Low back pain, unspecified: Secondary | ICD-10-CM | POA: Insufficient documentation

## 2021-05-02 DIAGNOSIS — J441 Chronic obstructive pulmonary disease with (acute) exacerbation: Secondary | ICD-10-CM | POA: Insufficient documentation

## 2021-05-02 DIAGNOSIS — R109 Unspecified abdominal pain: Secondary | ICD-10-CM | POA: Insufficient documentation

## 2021-05-02 DIAGNOSIS — Z7951 Long term (current) use of inhaled steroids: Secondary | ICD-10-CM | POA: Insufficient documentation

## 2021-05-02 DIAGNOSIS — K219 Gastro-esophageal reflux disease without esophagitis: Secondary | ICD-10-CM | POA: Insufficient documentation

## 2021-05-02 DIAGNOSIS — J4540 Moderate persistent asthma, uncomplicated: Secondary | ICD-10-CM | POA: Insufficient documentation

## 2021-05-02 LAB — URINALYSIS, ROUTINE W REFLEX MICROSCOPIC
Bilirubin Urine: NEGATIVE
Glucose, UA: NEGATIVE mg/dL
Ketones, ur: NEGATIVE mg/dL
Leukocytes,Ua: NEGATIVE
Nitrite: NEGATIVE
Protein, ur: NEGATIVE mg/dL
Specific Gravity, Urine: 1.021 (ref 1.005–1.030)
pH: 6 (ref 5.0–8.0)

## 2021-05-02 LAB — POC URINE PREG, ED: Preg Test, Ur: NEGATIVE

## 2021-05-02 NOTE — ED Triage Notes (Signed)
Pt presents to ER c/o left sided flank pain that started today after pt was lifting something and stood up.  When pt stood up, she felt something pop in her left side.  Pt has been unable to sit or lay down since this episode d/t pain.  Pt denies urinary sx, denies hx of kidney stones.  Pt A&O x4 at this time.

## 2021-05-03 ENCOUNTER — Emergency Department
Admission: EM | Admit: 2021-05-03 | Discharge: 2021-05-03 | Disposition: A | Discharge status: Home / Self Care | Payer: Medicaid Other | Attending: Emergency Medicine | Admitting: Emergency Medicine

## 2021-05-03 DIAGNOSIS — M545 Low back pain, unspecified: Secondary | ICD-10-CM

## 2021-05-03 MED ORDER — CYCLOBENZAPRINE HCL 10 MG PO TABS
5.0000 mg | ORAL_TABLET | Freq: Once | ORAL | Status: AC
  Administered 2021-05-03: 5 mg via ORAL
  Filled 2021-05-03: qty 1

## 2021-05-03 MED ORDER — ACETAMINOPHEN 500 MG PO TABS
1000.0000 mg | ORAL_TABLET | Freq: Once | ORAL | Status: AC
  Administered 2021-05-03: 1000 mg via ORAL
  Filled 2021-05-03: qty 2

## 2021-05-03 MED ORDER — IBUPROFEN 800 MG PO TABS: 800.0000 mg | ORAL_TABLET | Freq: Three times a day (TID) | ORAL | 0 refills | Status: AC | PRN

## 2021-05-03 MED ORDER — KETOROLAC TROMETHAMINE 30 MG/ML IJ SOLN
30.0000 mg | Freq: Once | INTRAMUSCULAR | Status: AC
  Administered 2021-05-03: 30 mg via INTRAMUSCULAR
  Filled 2021-05-03: qty 1

## 2021-05-03 MED ORDER — LIDOCAINE 5 % EX PTCH: 1.0000 | MEDICATED_PATCH | Freq: Two times a day (BID) | CUTANEOUS | 0 refills | Status: AC

## 2021-05-03 MED ORDER — LIDOCAINE 5 % EX PTCH
1.0000 | MEDICATED_PATCH | CUTANEOUS | Status: DC
  Administered 2021-05-03: 1 via TRANSDERMAL
  Filled 2021-05-03: qty 1

## 2021-05-03 MED ORDER — CYCLOBENZAPRINE HCL 5 MG PO TABS: 5.0000 mg | ORAL_TABLET | Freq: Three times a day (TID) | ORAL | 0 refills | Status: AC | PRN

## 2021-05-03 NOTE — ED Notes (Signed)
Says she bend down to get something and felt a pop in left side lower back.  Says she cant sit down now.  Ambulatory in room

## 2021-05-03 NOTE — Discharge Instructions (Addendum)
Return for numbness, peeing or pooping on self or any other concerns

## 2021-05-03 NOTE — ED Provider Notes (Signed)
[facility] Emergency Department Provider Note  ____________________________________________   Event Date/Time   First MD Initiated Contact with Patient 05/03/21 0503     (approximate)  I have reviewed the triage vital signs and the nursing notes.   HISTORY  Chief Complaint Flank Pain    HPI Kara West is a 45 y.o. female with anemia who comes in with left flank pain. Started at 1230PM after trying to pick something up felt a pop in her back. No urinary or rectal incontinence, no numbness in butt. No leg weakness just pain shooting down the L leg.  No numbness of leg. Pt has nerve stimulator for the spine one year ago.           Past Medical History:  Diagnosis Date   Anemia    s/p transfusion   Bronchitis    COPD (chronic obstructive pulmonary disease) ([facility])    GERD (gastroesophageal reflux disease)    History of Papanicolaou smear of cervix 10/12/2011   neg    Patient Active Problem List   Diagnosis Date Noted   Neuropathic pain of left lower extremity 10/23/2019   Chronic pain syndrome 04/16/2019   Chronic radicular lumbar pain 04/16/2019   Bilateral leg pain 11/14/2018   Leg pain 07/11/2018   Lymphedema 07/11/2018   Menorrhagia with regular cycle 03/06/2018   Bilateral lower extremity edema 12/18/2017   Bilateral cellulitis of lower leg 12/18/2017   Sensory polyneuropathy 08/07/2017   Paresthesia of both feet 02/16/2017   Moderate Persistent Asthma 02/18/2015   Allergic rhinitis 02/18/2015   COPD exacerbation ([place]) 12/01/2014    Past Surgical History:  Procedure Laterality Date   CESAREAN SECTION     x3   DILATION AND CURETTAGE OF UTERUS  11/11/2011   hysteroscopy   ENDOMETRIAL ABLATION  11/11/2011   THORACIC LAMINECTOMY FOR SPINAL CORD STIMULATOR N/A 02/17/2020   Procedure: THORACIC SPINAL CORD STIMULATOR PERCUTANEOUS WITH FLANK PULSE GENERATOR;  Surgeon: [name], MD;  Location: [facility] ORS;  Service:  Neurosurgery;  Laterality: N/A;   TUBAL LIGATION  1997    Prior to Admission medications   Medication Sig Start Date End Date Taking? Authorizing Provider  albuterol (PROVENTIL) (2.5 MG/3ML) 0.083% nebulizer solution Take 3 mLs (2.5 mg total) by nebulization every 4 (four) hours as needed for wheezing or shortness of breath. 08/24/20 08/24/21  Ward, Cyril Mourning N, DO  albuterol (PROVENTIL) (2.5 MG/3ML) 0.083% nebulizer solution Take 3 mLs (2.5 mg total) by nebulization every 4 (four) hours as needed for wheezing or shortness of breath. 09/15/20 09/15/21  Vladimir Crofts, MD  albuterol (VENTOLIN HFA) 108 (90 Base) MCG/ACT inhaler Inhale 2 puffs into the lungs every 6 (six) hours as needed for wheezing or shortness of breath. 08/24/20   Ward, Delice Bison, DO  amitriptyline (ELAVIL) 50 MG tablet Take 1.5 tablets (75 mg total) by mouth at bedtime. Patient not taking: Reported on 01/30/2020 10/02/19   Gillis Santa, MD  benzonatate (TESSALON PERLES) 100 MG capsule Take 1 capsule (100 mg total) by mouth 3 (three) times daily as needed for cough. 08/24/20 08/24/21  Ward, Delice Bison, DO  DULoxetine (CYMBALTA) 60 MG capsule TAKE 1 CAPSULE BY MOUTH ONCE DAILY FOR MOOD Patient not taking: Reported on 01/30/2020 11/09/18   [provider]  ferrous sulfate 325 (65 FE) MG tablet Take by mouth. Patient not taking: Reported on 01/30/2020    [provider]  Fluticasone-Salmeterol (ADVAIR DISKUS) 100-50 MCG/DOSE AEPB Inhale 1 puff into the lungs in  the morning and at bedtime. 08/24/20   Ward, Delice Bison, DO  Fremanezumab-vfrm (AJOVY) 225 MG/1.5ML SOSY Inject into the skin. Patient not taking: Reported on 01/30/2020 12/07/18   [provider]  gabapentin (NEURONTIN) 600 MG tablet Take 1 tablet (600 mg total) by mouth 3 (three) times daily. 04/16/19 02/17/20  Gillis Santa, MD  predniSONE (DELTASONE) 20 MG tablet Take 3 tablets (60 mg total) by mouth daily. 08/24/20   Ward, Delice Bison, DO  rizatriptan (MAXALT) 10 MG tablet  TAKE ONE TABLET BY MOUTH ONCE AS NEEDED FOR MIGRAINE. MAY TAKE A SECOND DOSE AFTER 2 HOURS IF NEEDED. Patient not taking: Reported on 01/30/2020 12/09/18   [provider]  Spacer/Aero-Holding Chambers (AEROCHAMBER MV) inhaler Use as instructed 02/18/15   Juanito Doom, MD  thiamine 100 MG tablet Take 100 mg by mouth daily.  Patient not taking: Reported on 01/30/2020    [provider]  topiramate (TOPAMAX) 50 MG tablet TAKE 1 2 (ONE HALF) TABLET BY MOUTH NIGHTLY FOR 14 DAYS THEN INCREASE TO 1 TABLET ONCE DAILY AT NIGHT Patient not taking: Reported on 01/30/2020 01/10/19   [provider]    Allergies Patient has no known allergies.  Family History  Problem Relation Age of Onset   Hypertension Mother    Diabetes Mother     Social History Social History   Tobacco Use   Smoking status: Former    Packs/day: 0.20    Years: 5.00    Pack years: 1.00    Types: Cigarettes    Quit date: 06/11/2014    Years since quitting: 6.8   Smokeless tobacco: Never  Vaping Use   Vaping Use: Never used  Substance Use Topics   Alcohol use: Yes    Alcohol/week: 0.0 standard drinks    Comment: occassional   Drug use: No      Review of Systems Constitutional: No fever/chills Eyes: No visual changes. ENT: No sore throat. Cardiovascular: Denies chest pain. Respiratory: Denies shortness of breath. Gastrointestinal: No abdominal pain.  No nausea, no vomiting.  No diarrhea.  No constipation. Genitourinary: Negative for dysuria. Musculoskeletal: R flankpain  Skin: Negative for rash. Neurological: Negative for headaches, focal weakness or numbness. All other ROS negative ____________________________________________   PHYSICAL EXAM:  VITAL SIGNS: ED Triage Vitals  Enc Vitals Group     BP 05/02/21 2254 110/82     Pulse Rate 05/02/21 2254 76     Resp 05/02/21 2254 16     Temp 05/02/21 2254 97.6 F (36.4 C)     Temp Source 05/02/21 2254 Oral     SpO2 05/02/21  2254 99 %     Weight 05/02/21 2255 245 lb (111.1 kg)     Height 05/02/21 2255 5\' 10"  (1.778 m)     Head Circumference --      Peak Flow --      Pain Score 05/02/21 2254 10     Pain Loc --      Pain Edu? --      Excl. in [place]? --     Constitutional: Alert and oriented. Well appearing and in no acute distress. Eyes: Conjunctivae are normal. EOMI. Head: Atraumatic. Nose: No congestion/rhinnorhea. Mouth/Throat: Mucous membranes are moist.   Neck: No stridor. Trachea Midline. FROM Cardiovascular: Normal rate, regular rhythm. Grossly normal heart sounds.  Good peripheral circulation. Respiratory: Normal respiratory effort.  No retractions. Lungs CTAB. Gastrointestinal: Soft and nontender. No distention. No abdominal bruits.  Musculoskeletal: No lower extremity tenderness nor  edema.  No joint effusions. Good strength in legs, able to stand up.  Neurologic:  Normal speech and language. No gross focal neurologic deficits are appreciated. Able to plantar and dorsi flex Skin:  Skin is warm, dry and intact. No rash noted. Psychiatric: Mood and affect are normal. Speech and behavior are normal. GU: Deferred  Reported tenderness L flank. CTL non tender.   ____________________________________________   LABS (all labs ordered are listed, but only abnormal results are displayed)  Labs Reviewed  URINALYSIS, ROUTINE W REFLEX MICROSCOPIC - Abnormal; Notable for the following components:      Result Value   Color, Urine YELLOW (*)    APPearance HAZY (*)    Hgb urine dipstick SMALL (*)    Bacteria, UA RARE (*)    All other components within normal limits  POC URINE PREG, ED   ____________________________________________  INITIAL IMPRESSION / ASSESSMENT AND PLAN / ED COURSE  Kara West was evaluated in Emergency Department on 05/03/2021 for the symptoms described in the history of present illness. She was evaluated in the context of the global COVID-19 pandemic, which necessitated  consideration that the patient might be at risk for infection with the SARS-CoV-2 virus that causes COVID-19. Institutional protocols and algorithms that pertain to the evaluation of patients at risk for COVID-19 are in a state of rapid change based on information released by regulatory bodies including the CDC and federal and state organizations. These policies and algorithms were followed during the patient's care in the ED.    No signs of kidney stone, pregnancy, UTI. Suspect msk. No evidence of cord compression. Pt well appearing. Going to f.u with doc who has nerve stimulator.   No h/o gi bleeding, normal recent kidney function   Discussed symptom rx and not driving on flexeril.        ____________________________________________   FINAL CLINICAL IMPRESSION(S) / ED DIAGNOSES   Final diagnoses:  Acute left-sided low back pain without sciatica      MEDICATIONS GIVEN DURING THIS VISIT:  Medications  ketorolac (TORADOL) 30 MG/ML injection 30 mg (has no administration in time range)  acetaminophen (TYLENOL) tablet 1,000 mg (has no administration in time range)  lidocaine (LIDODERM) 5 % 1 patch (has no administration in time range)  cyclobenzaprine (FLEXERIL) tablet 5 mg (has no administration in time range)     ED Discharge Orders          Ordered    lidocaine (LIDODERM) 5 %  Every 12 hours        05/03/21 0510    cyclobenzaprine (FLEXERIL) 5 MG tablet  3 times daily PRN        05/03/21 0510    ibuprofen (ADVIL) 800 MG tablet  Every 8 hours PRN        05/03/21 0510             Note:  This document was prepared using Dragon voice recognition software and may include unintentional dictation errors.    Vanessa [place], MD 05/03/21 [phone]

## 2022-03-27 ENCOUNTER — Emergency Department: Payer: Medicaid Other

## 2022-03-27 ENCOUNTER — Other Ambulatory Visit: Payer: Self-pay

## 2022-03-27 DIAGNOSIS — Z5321 Procedure and treatment not carried out due to patient leaving prior to being seen by health care provider: Secondary | ICD-10-CM | POA: Insufficient documentation

## 2022-03-27 DIAGNOSIS — R079 Chest pain, unspecified: Secondary | ICD-10-CM | POA: Insufficient documentation

## 2022-03-27 DIAGNOSIS — R0602 Shortness of breath: Secondary | ICD-10-CM | POA: Insufficient documentation

## 2022-03-27 LAB — CBC
HCT: 39.7 % (ref 36.0–46.0)
Hemoglobin: 12.3 g/dL (ref 12.0–15.0)
MCH: 28.3 pg (ref 26.0–34.0)
MCHC: 31 g/dL (ref 30.0–36.0)
MCV: 91.5 fL (ref 80.0–100.0)
Platelets: 214 10*3/uL (ref 150–400)
RBC: 4.34 MIL/uL (ref 3.87–5.11)
RDW: 13.2 % (ref 11.5–15.5)
WBC: 4.5 10*3/uL (ref 4.0–10.5)
nRBC: 0 % (ref 0.0–0.2)

## 2022-03-27 LAB — BASIC METABOLIC PANEL
Anion gap: 6 (ref 5–15)
BUN: 14 mg/dL (ref 6–20)
CO2: 28 mmol/L (ref 22–32)
Calcium: 9.2 mg/dL (ref 8.9–10.3)
Chloride: 107 mmol/L (ref 98–111)
Creatinine, Ser: 0.66 mg/dL (ref 0.44–1.00)
GFR, Estimated: >60 mL/min (ref >60)
Glucose, Bld: 71 mg/dL (ref 70–99)
Potassium: 3.7 mmol/L (ref 3.5–5.1)
Sodium: 141 mmol/L (ref 135–145)

## 2022-03-27 LAB — TROPONIN I (HIGH SENSITIVITY): Troponin I (High Sensitivity): 3 ng/L (ref <18)

## 2022-03-27 NOTE — ED Triage Notes (Signed)
Pt arrives with c/o SOB and CP that started about 3 days ago. Pt denies fevers or congestion.

## 2022-03-27 NOTE — ED Notes (Signed)
Pt called for repeat troponin with no answer.

## 2022-03-28 ENCOUNTER — Emergency Department
Admission: EM | Admit: 2022-03-28 | Discharge: 2022-03-28 | Discharge status: Against Medical Advice | Payer: Medicaid Other | Attending: Emergency Medicine | Admitting: Emergency Medicine

## 2022-09-01 ENCOUNTER — Encounter: Payer: Self-pay | Admitting: Emergency Medicine

## 2022-09-01 ENCOUNTER — Other Ambulatory Visit: Payer: Self-pay

## 2022-09-01 ENCOUNTER — Emergency Department
Admission: EM | Admit: 2022-09-01 | Discharge: 2022-09-01 | Disposition: A | Discharge status: Home / Self Care | Payer: BLUE CROSS/BLUE SHIELD | Attending: Emergency Medicine | Admitting: Emergency Medicine

## 2022-09-01 DIAGNOSIS — M545 Low back pain, unspecified: Secondary | ICD-10-CM

## 2022-09-01 DIAGNOSIS — M519 Unspecified thoracic, thoracolumbar and lumbosacral intervertebral disc disorder: Secondary | ICD-10-CM

## 2022-09-01 DIAGNOSIS — M5186 Other intervertebral disc disorders, lumbar region: Secondary | ICD-10-CM | POA: Insufficient documentation

## 2022-09-01 MED ORDER — KETOROLAC TROMETHAMINE 30 MG/ML IJ SOLN: 15.0000 mg | Freq: Once | INTRAMUSCULAR | Status: DC

## 2022-09-01 MED ORDER — IBUPROFEN 600 MG PO TABS: 600.0000 mg | ORAL_TABLET | Freq: Four times a day (QID) | ORAL | 0 refills | Status: DC | PRN

## 2022-09-01 MED ORDER — HYDROCODONE-ACETAMINOPHEN 5-325 MG PO TABS: 1.0000 | ORAL_TABLET | Freq: Four times a day (QID) | ORAL | 0 refills | Status: AC | PRN

## 2022-09-01 MED ORDER — HYDROCODONE-ACETAMINOPHEN 5-325 MG PO TABS
2.0000 | ORAL_TABLET | Freq: Once | ORAL | Status: AC
  Administered 2022-09-01: 2 via ORAL
  Filled 2022-09-01: qty 2

## 2022-09-01 MED ORDER — KETOROLAC TROMETHAMINE 60 MG/2ML IM SOLN
60.0000 mg | Freq: Once | INTRAMUSCULAR | Status: AC
  Administered 2022-09-01: 60 mg via INTRAMUSCULAR
  Filled 2022-09-01: qty 2

## 2022-09-01 MED ORDER — METHYLPREDNISOLONE 4 MG PO TBPK: ORAL_TABLET | ORAL | 0 refills | Status: DC

## 2022-09-01 NOTE — ED Provider Notes (Signed)
[facility] Provider Note    Event Date/Time   First MD Initiated Contact with Patient 09/01/22 0703     (approximate)   History   Back Pain   HPI  Kara West is a 47 y.o. female  here with back pain.  Patient states that for the last week, she has had persistent aching, throbbing, lower back pain.  The pain is worse with movement palpation.  Denies any numbness or weakness with it.  She does lift heavy boxes at work and thought it may be related to this.  She also has history of lumbar radiculopathy with spinal stimulator.  Denies any increase in her numbness or weakness of her legs.  No loss of bowel or bladder function.  No fevers or chills.  No recent illnesses.     Physical Exam   Triage Vital Signs: ED Triage Vitals [09/01/22 0618]  Enc Vitals Group     BP 109/81     Pulse Rate 93     Resp 18     Temp 98 F (36.7 C)     Temp Source Oral     SpO2 99 %     Weight      Height      Head Circumference      Peak Flow      Pain Score 10     Pain Loc      Pain Edu?      Excl. in [place]?     Most recent vital signs: Vitals:   09/01/22 0618  BP: 109/81  Pulse: 93  Resp: 18  Temp: 98 F (36.7 C)  SpO2: 99%     General: Awake, no distress.  CV:  Good peripheral perfusion.  Resp:  Normal work of breathing.  Abd:  No distention.  Other:  Moderate midline lower lumbar tenderness to palpation.  No rebound or guarding.  Strength out of 5 bilateral lower extremities.  Normal sensation light touch.  Quad reflexes 2+ and symmetric.   ED Results / Procedures / Treatments   Labs (all labs ordered are listed, but only abnormal results are displayed) Labs Reviewed - No data to display   EKG    RADIOLOGY    I also independently reviewed and agree with radiologist interpretations.   PROCEDURES:  Critical Care performed: No   MEDICATIONS ORDERED IN ED: Medications  HYDROcodone-acetaminophen (NORCO/VICODIN) 5-325 MG per  tablet 2 tablet (2 tablets Oral Given 09/01/22 0733)  ketorolac (TORADOL) injection 60 mg (60 mg Intramuscular Given 09/01/22 0733)     IMPRESSION / MDM / ASSESSMENT AND PLAN / ED COURSE  I reviewed the triage vital signs and the nursing notes.                              Differential diagnosis includes, but is not limited to, lumbar radicular pain, spinal stenosis, musculoskeletal pain, paraspinal strain/spasm  Patient's presentation is most consistent with acute presentation with potential threat to life or bodily function.  47 year old female here with atraumatic lower back pain less than 1 week.  Suspect acute on chronic lumbar pain secondary to known lumbar radiculopathy and degenerative disc disease.  Patient has no signs of cauda equina or cord compression.  She has no weakness or numbness.  She complains of mild headache which seems tension-like in distribution and she has no red flags.  She has a history of headaches.  Will treat  with steroids, brief course of analgesics, and good return precautions.     FINAL CLINICAL IMPRESSION(S) / ED DIAGNOSES   Final diagnoses:  Lumbar disc disease  Acute midline low back pain without sciatica     Rx / DC Orders   ED Discharge Orders          Ordered    methylPREDNISolone (MEDROL DOSEPAK) 4 MG TBPK tablet        09/01/22 0730    HYDROcodone-acetaminophen (NORCO/VICODIN) 5-325 MG tablet  Every 6 hours PRN        09/01/22 0730    ibuprofen (ADVIL) 600 MG tablet  Every 6 hours PRN        09/01/22 0730             Note:  This document was prepared using Dragon voice recognition software and may include unintentional dictation errors.   Duffy Bruce, MD 09/01/22 [phone]

## 2022-09-01 NOTE — ED Triage Notes (Signed)
Patient ambulatory to triage with steady gait, without difficulty or distress noted; pt reports lower back pain since Friday with no known injury; also reports rt sided HA; st pain unrelieved by OTC; no meds taken PTA

## 2022-11-02 ENCOUNTER — Encounter: Payer: Self-pay | Admitting: Emergency Medicine

## 2022-11-02 ENCOUNTER — Emergency Department: Payer: 59

## 2022-11-02 ENCOUNTER — Emergency Department
Admission: EM | Admit: 2022-11-02 | Discharge: 2022-11-03 | Disposition: A | Discharge status: Home / Self Care | Payer: 59 | Attending: Emergency Medicine | Admitting: Emergency Medicine

## 2022-11-02 DIAGNOSIS — M7121 Synovial cyst of popliteal space [Baker], right knee: Secondary | ICD-10-CM | POA: Diagnosis not present

## 2022-11-02 DIAGNOSIS — D649 Anemia, unspecified: Secondary | ICD-10-CM | POA: Insufficient documentation

## 2022-11-02 DIAGNOSIS — M7989 Other specified soft tissue disorders: Secondary | ICD-10-CM

## 2022-11-02 DIAGNOSIS — E876 Hypokalemia: Secondary | ICD-10-CM | POA: Diagnosis not present

## 2022-11-02 LAB — COMPREHENSIVE METABOLIC PANEL
ALT: 11 U/L (ref 0–44)
AST: 16 U/L (ref 15–41)
Albumin: 3.2 g/dL — ABNORMAL LOW (ref 3.5–5.0)
Alkaline Phosphatase: 56 U/L (ref 38–126)
Anion gap: 9 (ref 5–15)
BUN: 15 mg/dL (ref 6–20)
CO2: 20 mmol/L — ABNORMAL LOW (ref 22–32)
Calcium: 8.5 mg/dL — ABNORMAL LOW (ref 8.9–10.3)
Chloride: 108 mmol/L (ref 98–111)
Creatinine, Ser: 0.61 mg/dL (ref 0.44–1.00)
GFR, Estimated: >60 mL/min (ref >60)
Glucose, Bld: 93 mg/dL (ref 70–99)
Potassium: 3.2 mmol/L — ABNORMAL LOW (ref 3.5–5.1)
Sodium: 137 mmol/L (ref 135–145)
Total Bilirubin: 0.5 mg/dL (ref 0.3–1.2)
Total Protein: 6.6 g/dL (ref 6.5–8.1)

## 2022-11-02 LAB — CBC WITH DIFFERENTIAL/PLATELET
Abs Immature Granulocytes: 0.02 10*3/uL (ref 0.00–0.07)
Basophils Absolute: 0 10*3/uL (ref 0.0–0.1)
Basophils Relative: 1 %
Eosinophils Absolute: 0.1 10*3/uL (ref 0.0–0.5)
Eosinophils Relative: 2 %
HCT: 33.9 % — ABNORMAL LOW (ref 36.0–46.0)
Hemoglobin: 10.6 g/dL — ABNORMAL LOW (ref 12.0–15.0)
Immature Granulocytes: 1 %
Lymphocytes Relative: 36 %
Lymphs Abs: 1.4 10*3/uL (ref 0.7–4.0)
MCH: 26.7 pg (ref 26.0–34.0)
MCHC: 31.3 g/dL (ref 30.0–36.0)
MCV: 85.4 fL (ref 80.0–100.0)
Monocytes Absolute: 0.3 10*3/uL (ref 0.1–1.0)
Monocytes Relative: 7 %
Neutro Abs: 2.1 10*3/uL (ref 1.7–7.7)
Neutrophils Relative %: 53 %
Platelets: 210 10*3/uL (ref 150–400)
RBC: 3.97 MIL/uL (ref 3.87–5.11)
RDW: 14.1 % (ref 11.5–15.5)
WBC: 3.8 10*3/uL — ABNORMAL LOW (ref 4.0–10.5)
nRBC: 0 % (ref 0.0–0.2)

## 2022-11-02 LAB — URINALYSIS, ROUTINE W REFLEX MICROSCOPIC
Bilirubin Urine: NEGATIVE
Glucose, UA: NEGATIVE mg/dL
Hgb urine dipstick: NEGATIVE
Ketones, ur: 5 mg/dL — AB
Leukocytes,Ua: NEGATIVE
Nitrite: NEGATIVE
Protein, ur: NEGATIVE mg/dL
Specific Gravity, Urine: 1.028 (ref 1.005–1.030)
pH: 5 (ref 5.0–8.0)

## 2022-11-02 LAB — BRAIN NATRIURETIC PEPTIDE: B Natriuretic Peptide: 26.6 pg/mL (ref 0.0–100.0)

## 2022-11-02 LAB — POC URINE PREG, ED: Preg Test, Ur: NEGATIVE

## 2022-11-02 MED ORDER — IBUPROFEN 600 MG PO TABS
600.0000 mg | ORAL_TABLET | Freq: Once | ORAL | Status: AC
  Administered 2022-11-02: 600 mg via ORAL
  Filled 2022-11-02: qty 1

## 2022-11-02 MED ORDER — POTASSIUM CHLORIDE CRYS ER 20 MEQ PO TBCR
40.0000 meq | EXTENDED_RELEASE_TABLET | Freq: Once | ORAL | Status: AC
  Administered 2022-11-02: 40 meq via ORAL
  Filled 2022-11-02: qty 2

## 2022-11-02 NOTE — ED Provider Notes (Signed)
[facility] Provider Note    Event Date/Time   First MD Initiated Contact with Patient 11/02/22 2257     (approximate)   History   Leg Swelling   HPI  Kara West is a 47 y.o. female who comes in with concerns for bilateral leg swelling over the past several days.  She denies any chest pain, shortness of breath.  Patient reports that over the past 2 weeks she has noticed some increased swelling to her lower legs.  She denies any history of heart problems.  She does report having a back stimulator and she states that maybe 3 years ago she had some swelling her legs but does not remember being on any medications for it.  She reports that her legs feel tight in nature.  This is because some increasing discomfort and difficulties with ambulation.  She denies any new back pain.     Physical Exam   Triage Vital Signs: ED Triage Vitals  Enc Vitals Group     BP 11/02/22 2010 120/78     Pulse Rate 11/02/22 2010 90     Resp 11/02/22 2010 18     Temp 11/02/22 2010 98.3 F (36.8 C)     Temp Source 11/02/22 2010 Oral     SpO2 11/02/22 2010 100 %     Weight 11/02/22 2011 255 lb (115.7 kg)     Height 11/02/22 2011 5\' 10"  (1.778 m)     Head Circumference --      Peak Flow --      Pain Score --      Pain Loc --      Pain Edu? --      Excl. in [place]? --     Most recent vital signs: Vitals:   11/02/22 2010  BP: 120/78  Pulse: 90  Resp: 18  Temp: 98.3 F (36.8 C)  SpO2: 100%     General: Awake, no distress.  CV:  Good peripheral perfusion.  Resp:  Normal effort.  Abd:  No distention.  Other:  Good distal pulses bilaterally.  Able to lift both legs up off the bed.  Able to flex and extend the ankles.  1+ edema bilaterally up to the knee.  No obvious rash noted on the legs or blistering noted at this time but she reports some prior blisters on her feet.   ED Results / Procedures / Treatments   Labs (all labs ordered are listed, but only  abnormal results are displayed) Labs Reviewed  CBC WITH DIFFERENTIAL/PLATELET - Abnormal; Notable for the following components:      Result Value   WBC 3.8 (*)    Hemoglobin 10.6 (*)    HCT 33.9 (*)    All other components within normal limits  COMPREHENSIVE METABOLIC PANEL - Abnormal; Notable for the following components:   Potassium 3.2 (*)    CO2 20 (*)    Calcium 8.5 (*)    Albumin 3.2 (*)    All other components within normal limits  BRAIN NATRIURETIC PEPTIDE      RADIOLOGY I have reviewed the ultrasound personally interpreted  PROCEDURES:  Critical Care performed: No  Procedures   MEDICATIONS ORDERED IN ED: Medications - No data to display   IMPRESSION / MDM / ASSESSMENT AND PLAN / ED COURSE  I reviewed the triage vital signs and the nursing notes.   Patient's presentation is most consistent with acute presentation with potential threat to life or bodily function.  Differential includes liver failure, nephrotic syndrome, heart failure, venous stasis, DVTs  BNP is normal.  CBC shows slightly low hemoglobin of 10.6.  CMP does not show any evidence of liver issues.  Potassium slightly low at 3.2  The patient is on the cardiac monitor to evaluate for evidence of arrhythmia and/or significant heart rate changes.      FINAL CLINICAL IMPRESSION(S) / ED DIAGNOSES   Final diagnoses:  None     Rx / DC Orders   ED Discharge Orders     None        Note:  This document was prepared using Dragon voice recognition software and may include unintentional dictation errors.

## 2022-11-02 NOTE — ED Triage Notes (Signed)
Pt presents ambulatory to triage via POV with complaints of bilateral leg swelling for the last several days. +1 pitting edema to bilateral ankles and lower leg. No changes in exercise regimen. Pt notes having blisters to the bottom of her feet- none visible at this time. A&Ox4 at this time. Denies CP or SOB.

## 2022-11-03 MED ORDER — IBUPROFEN 600 MG PO TABS: 600.0000 mg | ORAL_TABLET | Freq: Four times a day (QID) | ORAL | 0 refills | Status: AC | PRN

## 2022-11-03 NOTE — Discharge Instructions (Addendum)
Take Tylenol 1 g every 8 hours and alternate with ibuprofen.  Use compression socks, keep your legs elevated above your heart.  We discussed steroids but of opted to hold off due to concern this could cause swelling to get worse and on the left leg.  Referred you to the heart failure clinic so they can decide if they want to start you on some Lasix and do an echo of your heart but at this time the swelling seems more likely related to the Baker's cyst at least on the right leg.  You should call the orthopedic number to make a follow-up appointment for the Baker's cyst and return to the ER if you develop fevers, worsening symptoms or any other concerns     A cystic structure within the right popliteal fossa appears to  extend inferiorly superficial to the gastrocnemius, likely  reflecting a dissecting Baker cyst.    Limitations: none    IMPRESSION:  1. No femoropopliteal DVT within the lower extremities.  2. Probable dissecting Baker cyst within the right popliteal fossa.

## 2022-11-30 ENCOUNTER — Telehealth: Payer: Self-pay

## 2022-11-30 NOTE — Telephone Encounter (Signed)
Called patient to schedule an appointment due to a  referral by Dr. Artis Delay. Patient did not answer but voicemail was left for patient to call HF clinic to schedule appointment.

## 2022-12-07 ENCOUNTER — Telehealth: Payer: Self-pay

## 2022-12-08 ENCOUNTER — Telehealth: Payer: Self-pay

## 2022-12-08 NOTE — Telephone Encounter (Signed)
Patient returned call to clinic, patient has scheduled her new patient appointment for 12/23/22 with Clarisa Kindred, FNP.

## 2022-12-23 ENCOUNTER — Encounter: Payer: BLUE CROSS/BLUE SHIELD | Admitting: Family

## 2022-12-23 ENCOUNTER — Telehealth: Payer: Self-pay | Admitting: Family

## 2022-12-23 NOTE — Progress Notes (Deleted)
Advanced Heart Failure Clinic Note   Referring Physician: PCP: [facility] PCP-Cardiologist: None   HPI:  Kara West is a 47 y/o female with a history of  Was in the ED 11/02/22 due to bilateral leg swelling. Ultrasound showed possible ruptured Baker's cyst in the right leg.  No Echo has been done.   She presents today for her initial visit with a chief complaint of       Review of Systems: [y] = yes, [ ]  = no   General: Weight gain [ ] ; Weight loss [ ] ; Anorexia [ ] ; Fatigue [ ] ; Fever [ ] ; Chills [ ] ; Weakness [ ]   Cardiac: Chest pain/pressure [ ] ; Resting SOB [ ] ; Exertional SOB [ ] ; Orthopnea [ ] ; Pedal Edema [ ] ; Palpitations [ ] ; Syncope [ ] ; Presyncope [ ] ; Paroxysmal nocturnal dyspnea[ ]   Pulmonary: Cough [ ] ; Wheezing[ ] ; Hemoptysis[ ] ; Sputum [ ] ; Snoring [ ]   GI: Vomiting[ ] ; Dysphagia[ ] ; Melena[ ] ; Hematochezia [ ] ; Heartburn[ ] ; Abdominal pain [ ] ; Constipation [ ] ; Diarrhea [ ] ; BRBPR [ ]   GU: Hematuria[ ] ; Dysuria [ ] ; Nocturia[ ]   Vascular: Pain in legs with walking [ ] ; Pain in feet with lying flat [ ] ; Non-healing sores [ ] ; Stroke [ ] ; TIA [ ] ; Slurred speech [ ] ;  Neuro: Headaches[ ] ; Vertigo[ ] ; Seizures[ ] ; Paresthesias[ ] ;Blurred vision [ ] ; Diplopia [ ] ; Vision changes [ ]   Ortho/Skin: Arthritis [ ] ; Joint pain [ ] ; Muscle pain [ ] ; Joint swelling [ ] ; Back Pain [ ] ; Rash [ ]   Psych: Depression[ ] ; Anxiety[ ]   Heme: Bleeding problems [ ] ; Clotting disorders [ ] ; Anemia [ ]   Endocrine: Diabetes [ ] ; Thyroid dysfunction[ ]    Past Medical History:  Diagnosis Date   Anemia    s/p transfusion   Bronchitis    COPD (chronic obstructive pulmonary disease) (HCC)    GERD (gastroesophageal reflux disease)    History of Papanicolaou smear of cervix 10/12/2011   neg    Current Outpatient Medications  Medication Sig Dispense Refill   albuterol (PROVENTIL) (2.5 MG/3ML) 0.083% nebulizer solution Take 3 mLs (2.5 mg total) by  nebulization every 4 (four) hours as needed for wheezing or shortness of breath. 75 mL 2   albuterol (PROVENTIL) (2.5 MG/3ML) 0.083% nebulizer solution Take 3 mLs (2.5 mg total) by nebulization every 4 (four) hours as needed for wheezing or shortness of breath. 75 mL 2   albuterol (VENTOLIN HFA) 108 (90 Base) MCG/ACT inhaler Inhale 2 puffs into the lungs every 6 (six) hours as needed for wheezing or shortness of breath. 8 g 1   amitriptyline (ELAVIL) 50 MG tablet Take 1.5 tablets (75 mg total) by mouth at bedtime. (Patient not taking: Reported on 01/30/2020) 30 tablet 2   DULoxetine (CYMBALTA) 60 MG capsule TAKE 1 CAPSULE BY MOUTH ONCE DAILY FOR MOOD (Patient not taking: Reported on 01/30/2020)     ferrous sulfate 325 (65 FE) MG tablet Take by mouth. (Patient not taking: Reported on 01/30/2020)     Fluticasone-Salmeterol (ADVAIR DISKUS) 100-50 MCG/DOSE AEPB Inhale 1 puff into the lungs in the morning and at bedtime. 60 each 0   Fremanezumab-vfrm (AJOVY) 225 MG/1.5ML SOSY Inject into the skin. (Patient not taking: Reported on 01/30/2020)     gabapentin (NEURONTIN) 600 MG tablet Take 1 tablet (600 mg total) by mouth 3 (three) times daily. 90 tablet 2   HYDROcodone-acetaminophen (NORCO/VICODIN) 5-325 MG tablet Take 1-2 tablets  by mouth every 6 (six) hours as needed for severe pain or moderate pain. 12 tablet 0   methylPREDNISolone (MEDROL DOSEPAK) 4 MG TBPK tablet Take as directed on packaging 21 tablet 0   predniSONE (DELTASONE) 20 MG tablet Take 3 tablets (60 mg total) by mouth daily. 15 tablet 0   rizatriptan (MAXALT) 10 MG tablet TAKE ONE TABLET BY MOUTH ONCE AS NEEDED FOR MIGRAINE. MAY TAKE A SECOND DOSE AFTER 2 HOURS IF NEEDED. (Patient not taking: Reported on 01/30/2020)     Spacer/Aero-Holding Chambers (AEROCHAMBER MV) inhaler Use as instructed 1 each 0   thiamine 100 MG tablet Take 100 mg by mouth daily.  (Patient not taking: Reported on 01/30/2020)     topiramate (TOPAMAX) 50 MG tablet TAKE 1 2 (ONE  HALF) TABLET BY MOUTH NIGHTLY FOR 14 DAYS THEN INCREASE TO 1 TABLET ONCE DAILY AT NIGHT (Patient not taking: Reported on 01/30/2020)     No current facility-administered medications for this visit.    No Known Allergies    Social History   Socioeconomic History   Marital status: Single    Spouse name: Not on file   Number of children: Not on file   Years of education: Not on file   Highest education level: Not on file  Occupational History   Not on file  Tobacco Use   Smoking status: Former    Packs/day: 0.20    Years: 5.00    Additional pack years: 0.00    Total pack years: 1.00    Types: Cigarettes    Quit date: 06/11/2014    Years since quitting: 8.5   Smokeless tobacco: Never  Vaping Use   Vaping Use: Never used  Substance and Sexual Activity   Alcohol use: Yes    Alcohol/week: 0.0 standard drinks of alcohol    Comment: occassional   Drug use: No   Sexual activity: Yes    Birth control/protection: Surgical  Other Topics Concern   Not on file  Social History Narrative   Not on file   Social Determinants of Health   Financial Resource Strain: Not on file  Food Insecurity: Not on file  Transportation Needs: Not on file  Physical Activity: Not on file  Stress: Not on file  Social Connections: Not on file  Intimate Partner Violence: Not on file      Family History  Problem Relation Age of Onset   Hypertension Mother    Diabetes Mother        PHYSICAL EXAM: General:  Well appearing. No respiratory difficulty HEENT: normal Neck: supple. no JVD. Carotids 2+ bilat; no bruits. No lymphadenopathy or thyromegaly appreciated. Cor: PMI nondisplaced. Regular rate & rhythm. No rubs, gallops or murmurs. Lungs: clear Abdomen: soft, nontender, nondistended. No hepatosplenomegaly. No bruits or masses. Good bowel sounds. Extremities: no cyanosis, clubbing, rash, edema Neuro: alert & oriented x 3, cranial nerves grossly intact. moves all 4 extremities w/o  difficulty. Affect pleasant.  ECG:   ASSESSMENT & PLAN:  1: SOB- - NYHA class - euvolemic - weighing daily - echo has been scheduled for - BNP 11/02/22 was 26.6 - BMP 11/02/22 showed sodium 137, potassium 3.2, creatinine 0.61 & GFR >60 - repeat BMP today  2: Right wrist tendonitis- - saw orthopaedics    Delma Freeze, FNP 12/23/22

## 2022-12-23 NOTE — Telephone Encounter (Signed)
Patient did not show for her initial Heart Failure Clinic appointment on 12/23/22

## 2022-12-29 NOTE — Telephone Encounter (Signed)
LVM. 2nd attempt - called to schedule pt appt from referral's que.

## 2022-12-30 ENCOUNTER — Other Ambulatory Visit: Payer: Self-pay | Admitting: Student

## 2022-12-30 DIAGNOSIS — M7121 Synovial cyst of popliteal space [Baker], right knee: Secondary | ICD-10-CM

## 2022-12-30 DIAGNOSIS — M25561 Pain in right knee: Secondary | ICD-10-CM

## 2022-12-30 DIAGNOSIS — M25861 Other specified joint disorders, right knee: Secondary | ICD-10-CM

## 2022-12-30 DIAGNOSIS — M1711 Unilateral primary osteoarthritis, right knee: Secondary | ICD-10-CM

## 2023-01-08 ENCOUNTER — Ambulatory Visit
Admission: RE | Admit: 2023-01-08 | Discharge: 2023-01-08 | Disposition: A | Discharge status: Home / Self Care | Payer: 59 | Source: Ambulatory Visit | Attending: Student | Admitting: Student

## 2023-01-08 DIAGNOSIS — M25861 Other specified joint disorders, right knee: Secondary | ICD-10-CM

## 2023-01-08 DIAGNOSIS — M1711 Unilateral primary osteoarthritis, right knee: Secondary | ICD-10-CM

## 2023-01-08 DIAGNOSIS — M25561 Pain in right knee: Secondary | ICD-10-CM

## 2023-01-08 DIAGNOSIS — M7121 Synovial cyst of popliteal space [Baker], right knee: Secondary | ICD-10-CM

## 2023-01-10 ENCOUNTER — Ambulatory Visit
Admission: RE | Admit: 2023-01-10 | Discharge: 2023-01-10 | Disposition: A | Discharge status: Home / Self Care | Payer: 59 | Source: Ambulatory Visit | Attending: Student | Admitting: Student

## 2023-01-19 ENCOUNTER — Ambulatory Visit
Admission: RE | Admit: 2023-01-19 | Discharge: 2023-01-19 | Disposition: A | Discharge status: Home / Self Care | Payer: 59 | Source: Ambulatory Visit | Attending: Student | Admitting: Student

## 2023-01-19 DIAGNOSIS — M7121 Synovial cyst of popliteal space [Baker], right knee: Secondary | ICD-10-CM | POA: Diagnosis present

## 2023-01-19 DIAGNOSIS — M1711 Unilateral primary osteoarthritis, right knee: Secondary | ICD-10-CM | POA: Diagnosis present

## 2023-01-19 DIAGNOSIS — M25561 Pain in right knee: Secondary | ICD-10-CM | POA: Insufficient documentation

## 2023-01-19 DIAGNOSIS — M25861 Other specified joint disorders, right knee: Secondary | ICD-10-CM | POA: Insufficient documentation

## 2023-03-07 ENCOUNTER — Emergency Department
Admission: EM | Admit: 2023-03-07 | Discharge: 2023-03-07 | Disposition: A | Discharge status: Home / Self Care | Payer: 59 | Attending: Emergency Medicine | Admitting: Emergency Medicine

## 2023-03-07 ENCOUNTER — Other Ambulatory Visit: Payer: Self-pay

## 2023-03-07 ENCOUNTER — Emergency Department: Payer: 59

## 2023-03-07 ENCOUNTER — Encounter: Payer: Self-pay | Admitting: Emergency Medicine

## 2023-03-07 DIAGNOSIS — J069 Acute upper respiratory infection, unspecified: Secondary | ICD-10-CM | POA: Insufficient documentation

## 2023-03-07 DIAGNOSIS — J449 Chronic obstructive pulmonary disease, unspecified: Secondary | ICD-10-CM | POA: Insufficient documentation

## 2023-03-07 DIAGNOSIS — Z1152 Encounter for screening for COVID-19: Secondary | ICD-10-CM | POA: Insufficient documentation

## 2023-03-07 DIAGNOSIS — R059 Cough, unspecified: Secondary | ICD-10-CM | POA: Diagnosis present

## 2023-03-07 LAB — RESP PANEL BY RT-PCR (RSV, FLU A&B, COVID)  RVPGX2
Influenza A by PCR: NEGATIVE
Influenza B by PCR: NEGATIVE
Resp Syncytial Virus by PCR: NEGATIVE
SARS Coronavirus 2 by RT PCR: NEGATIVE

## 2023-03-07 MED ORDER — COMPRESSOR/NEBULIZER MISC: 1.0000 | 0 refills | Status: AC

## 2023-03-07 MED ORDER — ALBUTEROL SULFATE HFA 108 (90 BASE) MCG/ACT IN AERS: 2.0000 | INHALATION_SPRAY | Freq: Four times a day (QID) | RESPIRATORY_TRACT | 0 refills | Status: AC | PRN

## 2023-03-07 NOTE — ED Triage Notes (Signed)
Patient ambulatory to triage with steady gait, without difficulty or distress noted; pt reports since Friday having prod cough clear sputum accomp by congestion and eye irritation

## 2023-03-07 NOTE — ED Notes (Signed)
See triage note  Presents with cough which started last Friday  States cough has been productive at times Afebrile on arrival

## 2023-03-07 NOTE — ED Provider Notes (Signed)
[facility] Provider Note    Event Date/Time   First MD Initiated Contact with Patient 03/07/23 [id number]     (approximate)   History   Chief Complaint Cough   HPI  Kara West is a 47 y.o. female with past medical history of COPD, anemia, and chronic pain syndrome who presents to the ED complaining of cough.  Patient reports that she has had 4 days of cough productive of clear sputum with nasal congestion and some mild difficulty breathing.  She also reports eye irritation and drainage, has not had any fevers or pain in her chest.     Physical Exam   Triage Vital Signs: ED Triage Vitals  Encounter Vitals Group     BP 03/07/23 0617 127/72     Systolic BP Percentile --      Diastolic BP Percentile --      Pulse Rate 03/07/23 0617 80     Resp 03/07/23 0617 20     Temp 03/07/23 0617 98.5 F (36.9 C)     Temp Source 03/07/23 0617 Oral     SpO2 03/07/23 0617 97 %     Weight 03/07/23 0614 265 lb (120.2 kg)     Height 03/07/23 0614 5\' 9"  (1.753 m)     Head Circumference --      Peak Flow --      Pain Score 03/07/23 0613 0     Pain Loc --      Pain Education --      Exclude from Growth Chart --     Most recent vital signs: Vitals:   03/07/23 0617  BP: 127/72  Pulse: 80  Resp: 20  Temp: 98.5 F (36.9 C)  SpO2: 97%    Constitutional: Alert and oriented. Eyes: Mild conjunctival injection noted. Head: Atraumatic. Nose: No congestion/rhinnorhea. Mouth/Throat: Mucous membranes are moist.  Cardiovascular: Normal rate, regular rhythm. Grossly normal heart sounds.  2+ radial pulses bilaterally. Respiratory: Normal respiratory effort.  No retractions. Lungs CTAB. Gastrointestinal: Soft and nontender. No distention. Musculoskeletal: No lower extremity tenderness nor edema.  Neurologic:  Normal speech and language. No gross focal neurologic deficits are appreciated.    ED Results / Procedures / Treatments   Labs (all labs ordered are  listed, but only abnormal results are displayed) Labs Reviewed  RESP PANEL BY RT-PCR (RSV, FLU A&B, COVID)  [id number]   RADIOLOGY Chest x-ray reviewed and interpreted by me with no infiltrate, edema, or effusion.  PROCEDURES:  Critical Care performed: No  Procedures   MEDICATIONS ORDERED IN ED: Medications - No data to display   IMPRESSION / MDM / ASSESSMENT AND PLAN / ED COURSE  I reviewed the triage vital signs and the nursing notes.                              47 y.o. female with past medical history of COPD, anemia, and chronic pain syndrome who presents to the ED complaining of cough, congestion, and mild difficulty breathing for the past 4 days.  Patient's presentation is most consistent with acute complicated illness / injury requiring diagnostic workup.  Differential diagnosis includes, but is not limited to, pneumonia, COPD exacerbation, viral URI, COVID-19.  Patient nontoxic-appearing and in no acute distress, vital signs are unremarkable.  She is breathing comfortably on room air and lungs are clear to auscultation bilaterally.  Symptoms seem most consistent with viral URI, COVID testing is negative  and chest x-ray is unremarkable.  Patient appropriate for outpatient management, will prescribe albuterol inhaler and she is also requesting replacement nebulizer.  She was counseled to follow-up with her PCP and to return to the ED for new or worsening symptoms, patient agrees with plan.      FINAL CLINICAL IMPRESSION(S) / ED DIAGNOSES   Final diagnoses:  Viral URI with cough     Rx / DC Orders   ED Discharge Orders          Ordered    albuterol (VENTOLIN HFA) 108 (90 Base) MCG/ACT inhaler  Every 6 hours PRN       Note to Pharmacy: Please supply with spacer   03/07/23 0744    Nebulizers (COMPRESSOR/NEBULIZER) MISC  As directed        03/07/23 0744             Note:  This document was prepared using Dragon voice recognition software and may include  unintentional dictation errors.   Chesley Noon, MD 03/07/23 [phone]

## 2023-04-13 ENCOUNTER — Emergency Department
Admission: EM | Admit: 2023-04-13 | Discharge: 2023-04-13 | Disposition: A | Discharge status: Home / Self Care | Payer: 59

## 2023-04-13 NOTE — ED Notes (Signed)
Pt called multiple times for triage. No answer.  

## 2023-04-26 ENCOUNTER — Encounter: Payer: Self-pay | Admitting: Intensive Care

## 2023-04-26 ENCOUNTER — Emergency Department
Admission: EM | Admit: 2023-04-26 | Discharge: 2023-04-26 | Disposition: A | Discharge status: Home / Self Care | Payer: 59 | Attending: Emergency Medicine | Admitting: Emergency Medicine

## 2023-04-26 ENCOUNTER — Other Ambulatory Visit: Payer: Self-pay

## 2023-04-26 DIAGNOSIS — S40021A Contusion of right upper arm, initial encounter: Secondary | ICD-10-CM | POA: Diagnosis not present

## 2023-04-26 DIAGNOSIS — S40022A Contusion of left upper arm, initial encounter: Secondary | ICD-10-CM | POA: Diagnosis not present

## 2023-04-26 DIAGNOSIS — D696 Thrombocytopenia, unspecified: Secondary | ICD-10-CM | POA: Diagnosis not present

## 2023-04-26 DIAGNOSIS — S4991XA Unspecified injury of right shoulder and upper arm, initial encounter: Secondary | ICD-10-CM | POA: Diagnosis present

## 2023-04-26 DIAGNOSIS — S8012XA Contusion of left lower leg, initial encounter: Secondary | ICD-10-CM | POA: Insufficient documentation

## 2023-04-26 DIAGNOSIS — J449 Chronic obstructive pulmonary disease, unspecified: Secondary | ICD-10-CM | POA: Diagnosis not present

## 2023-04-26 DIAGNOSIS — S8011XA Contusion of right lower leg, initial encounter: Secondary | ICD-10-CM | POA: Insufficient documentation

## 2023-04-26 DIAGNOSIS — X58XXXA Exposure to other specified factors, initial encounter: Secondary | ICD-10-CM | POA: Insufficient documentation

## 2023-04-26 DIAGNOSIS — T148XXA Other injury of unspecified body region, initial encounter: Secondary | ICD-10-CM

## 2023-04-26 LAB — CBC WITH DIFFERENTIAL/PLATELET
Abs Immature Granulocytes: 0.01 10*3/uL (ref 0.00–0.07)
Basophils Absolute: 0 10*3/uL (ref 0.0–0.1)
Basophils Relative: 0 %
Eosinophils Absolute: 0.4 10*3/uL (ref 0.0–0.5)
Eosinophils Relative: 6 %
HCT: 35.7 % — ABNORMAL LOW (ref 36.0–46.0)
Hemoglobin: 11 g/dL — ABNORMAL LOW (ref 12.0–15.0)
Immature Granulocytes: 0 %
Lymphocytes Relative: 35 %
Lymphs Abs: 2.3 10*3/uL (ref 0.7–4.0)
MCH: 25.4 pg — ABNORMAL LOW (ref 26.0–34.0)
MCHC: 30.8 g/dL (ref 30.0–36.0)
MCV: 82.4 fL (ref 80.0–100.0)
Monocytes Absolute: 0.5 10*3/uL (ref 0.1–1.0)
Monocytes Relative: 7 %
Neutro Abs: 3.4 10*3/uL (ref 1.7–7.7)
Neutrophils Relative %: 52 %
Platelets: 126 10*3/uL — ABNORMAL LOW (ref 150–400)
RBC: 4.33 MIL/uL (ref 3.87–5.11)
RDW: 14.7 % (ref 11.5–15.5)
WBC: 6.7 10*3/uL (ref 4.0–10.5)
nRBC: 0 % (ref 0.0–0.2)

## 2023-04-26 LAB — PROTIME-INR
INR: 1 (ref 0.8–1.2)
Prothrombin Time: 13.4 s (ref 11.4–15.2)

## 2023-04-26 LAB — COMPREHENSIVE METABOLIC PANEL
ALT: 19 U/L (ref 0–44)
AST: 21 U/L (ref 15–41)
Albumin: 3.9 g/dL (ref 3.5–5.0)
Alkaline Phosphatase: 69 U/L (ref 38–126)
Anion gap: 5 (ref 5–15)
BUN: 12 mg/dL (ref 6–20)
CO2: 24 mmol/L (ref 22–32)
Calcium: 8.4 mg/dL — ABNORMAL LOW (ref 8.9–10.3)
Chloride: 106 mmol/L (ref 98–111)
Creatinine, Ser: 0.55 mg/dL (ref 0.44–1.00)
GFR, Estimated: >60 mL/min (ref >60)
Glucose, Bld: 89 mg/dL (ref 70–99)
Potassium: 3.3 mmol/L — ABNORMAL LOW (ref 3.5–5.1)
Sodium: 135 mmol/L (ref 135–145)
Total Bilirubin: 0.6 mg/dL (ref <1.2)
Total Protein: 7.6 g/dL (ref 6.5–8.1)

## 2023-04-26 LAB — APTT: aPTT: 36 s (ref 24–36)

## 2023-04-26 NOTE — Discharge Instructions (Addendum)
A referral has been sent to the hematology team for outpatient follow-up.  Please keep an eye out for their call.  If they have not called you within a week, please reach out to your primary care doctor to see if they can get this appointment set up for you.  Please return for any bleeding that is uncontrolled or significant bleeding in her stools.

## 2023-04-26 NOTE — ED Provider Notes (Signed)
[facility] Provider Note    Event Date/Time   First MD Initiated Contact with Patient 04/26/23 1939     (approximate)   History   Rash   HPI Kara West is a 47 y.o. female with history of COPD presenting today for bruising.  Patient states over the past several days she has noted areas of bruising popping up on her arms and legs.  No obvious known trauma.  No prior history of easy bleeding.  Denies any blood in her stools.  Denies any nosebleeds.  Only recent history is having injections into her knee with Orthovisc.  Otherwise denies any other systemic symptoms like fever, cough, congestion, shortness of breath, chest pain, abdominal pain.  Did have viral infection over a month ago.  Reviewed chart notes from orthopedic visit as well as recent ED visit     Physical Exam   Triage Vital Signs: ED Triage Vitals [04/26/23 1731]  Encounter Vitals Group     BP (!) 127/95     Systolic BP Percentile      Diastolic BP Percentile      Pulse Rate 76     Resp 18     Temp 98.2 F (36.8 C)     Temp Source Oral     SpO2 100 %     Weight 165 lb (74.8 kg)     Height 5\' 9"  (1.753 m)     Head Circumference      Peak Flow      Pain Score 7     Pain Loc      Pain Education      Exclude from Growth Chart     Most recent vital signs: Vitals:   04/26/23 1731  BP: (!) 127/95  Pulse: 76  Resp: 18  Temp: 98.2 F (36.8 C)  SpO2: 100%   I have reviewed the vital signs. General:  Awake, alert, no acute distress. Head:  Normocephalic, Atraumatic. EENT:  PERRL, EOMI, Oral mucosa pink and moist, Neck is supple. Cardiovascular: Regular rate, 2+ distal pulses. Respiratory:  Normal respiratory effort, symmetrical expansion, no distress.   Extremities:  Moving all four extremities through full ROM without pain.   Neuro:  Alert and oriented.  Interacting appropriately.   Skin: 1 bruise noted at the left [facility].  Bruise noted at the right [facility] as well as right  mid forearm.  No bruising noted elsewhere throughout body.  No other petechiae noted. Psych: Appropriate affect.    ED Results / Procedures / Treatments   Labs (all labs ordered are listed, but only abnormal results are displayed) Labs Reviewed  CBC WITH DIFFERENTIAL/PLATELET - Abnormal; Notable for the following components:      Result Value   Hemoglobin 11.0 (*)    HCT 35.7 (*)    MCH 25.4 (*)    Platelets 126 (*)    All other components within normal limits  COMPREHENSIVE METABOLIC PANEL - Abnormal; Notable for the following components:   Potassium 3.3 (*)    Calcium 8.4 (*)    All other components within normal limits  PROTIME-INR  APTT     EKG    RADIOLOGY    PROCEDURES:  Critical Care performed: No  Procedures   MEDICATIONS ORDERED IN ED: Medications - No data to display   IMPRESSION / MDM / ASSESSMENT AND PLAN / ED COURSE  I reviewed the triage vital signs and the nursing notes.  Differential diagnosis includes, but is not limited to, idiopathic thrombocytopenia, ITP, less likely TTP, medication side effect  Patient's presentation is most consistent with acute complicated illness / injury requiring diagnostic workup.  Patient is a 47 year old female presenting today for new bruising on her extremities.  No obvious trauma.  Several bruises noted in various stages of healing.  No other systemic bleeding symptoms and especially no GI bleeding.  Labs today show mild anemia and mild thrombocytopenia which are new for patient.  Otherwise reassuring coag studies.  Symptoms not consistent with TTP and platelets do not seem low enough to fully represent true ITP at this time.  Patient did recently have Orthovisc injections which I looked of have a less than 1% chance of causing thrombocytopenia so less likely from this.  Patient otherwise stable and will discharge her with follow-up with hematology.  No further recommendations at this  time.  Recommended Tylenol for pain symptoms.  Does not meet criteria for needing steroids at this time.      FINAL CLINICAL IMPRESSION(S) / ED DIAGNOSES   Final diagnoses:  Thrombocytopenia (HCC)  Bruising     Rx / DC Orders   ED Discharge Orders          Ordered    Ambulatory referral to Hematology / Oncology       Comments: New thrombocytopenia with scattered bruising throughout extremities.  Follow-up platelet count and monitor bleeding symptoms.   04/26/23 1951             Note:  This document was prepared using Dragon voice recognition software and may include unintentional dictation errors.   Janith Lima, MD 04/26/23 Corky [employer]s

## 2023-04-26 NOTE — ED Triage Notes (Signed)
Patient reports on Monday she started having bruising pop up on her arms and legs. Some of the bruising areas are tender.   Does not take a blood thinner

## 2023-05-03 ENCOUNTER — Inpatient Hospital Stay: Payer: 59 | Admitting: Oncology

## 2023-05-03 ENCOUNTER — Inpatient Hospital Stay: Payer: 59

## 2023-05-09 ENCOUNTER — Inpatient Hospital Stay: Payer: 59

## 2023-05-09 ENCOUNTER — Inpatient Hospital Stay: Payer: 59 | Attending: Oncology | Admitting: Oncology

## 2023-05-15 ENCOUNTER — Encounter: Payer: Self-pay | Admitting: Emergency Medicine

## 2023-12-13 ENCOUNTER — Other Ambulatory Visit: Payer: Self-pay | Admitting: Surgery

## 2023-12-19 ENCOUNTER — Other Ambulatory Visit: Payer: Self-pay

## 2023-12-19 ENCOUNTER — Encounter
Admission: RE | Admit: 2023-12-19 | Discharge: 2023-12-19 | Disposition: A | Discharge status: Home / Self Care | Source: Ambulatory Visit | Attending: Surgery | Admitting: Surgery

## 2023-12-19 VITALS — BP 108/94 | HR 82 | Resp 16 | Ht 69.0 in | Wt 222.9 lb

## 2023-12-19 DIAGNOSIS — Z01812 Encounter for preprocedural laboratory examination: Secondary | ICD-10-CM

## 2023-12-19 DIAGNOSIS — Z01818 Encounter for other preprocedural examination: Secondary | ICD-10-CM | POA: Insufficient documentation

## 2023-12-19 DIAGNOSIS — Z01810 Encounter for preprocedural cardiovascular examination: Secondary | ICD-10-CM | POA: Diagnosis not present

## 2023-12-19 HISTORY — DX: Unilateral primary osteoarthritis, right knee: M17.11

## 2023-12-19 HISTORY — DX: Headache, unspecified: R51.9

## 2023-12-19 HISTORY — DX: Complex tear of lateral meniscus, current injury, right knee, initial encounter: S83.271A

## 2023-12-19 HISTORY — DX: Other reaction to spinal and lumbar puncture: G97.1

## 2023-12-19 HISTORY — DX: Thrombocytopenia, unspecified: D69.6

## 2023-12-19 LAB — COMPREHENSIVE METABOLIC PANEL WITH GFR
ALT: 15 U/L (ref 0–44)
AST: 15 U/L (ref 15–41)
Albumin: 3.4 g/dL — ABNORMAL LOW (ref 3.5–5.0)
Alkaline Phosphatase: 50 U/L (ref 38–126)
Anion gap: 8 (ref 5–15)
BUN: 19 mg/dL (ref 6–20)
CO2: 24 mmol/L (ref 22–32)
Calcium: 8.5 mg/dL — ABNORMAL LOW (ref 8.9–10.3)
Chloride: 106 mmol/L (ref 98–111)
Creatinine, Ser: 0.57 mg/dL (ref 0.44–1.00)
GFR, Estimated: >60 mL/min (ref >60)
Glucose, Bld: 76 mg/dL (ref 70–99)
Potassium: 3.3 mmol/L — ABNORMAL LOW (ref 3.5–5.1)
Sodium: 138 mmol/L (ref 135–145)
Total Bilirubin: 0.4 mg/dL (ref 0.0–1.2)
Total Protein: 6.4 g/dL — ABNORMAL LOW (ref 6.5–8.1)

## 2023-12-19 LAB — URINALYSIS, ROUTINE W REFLEX MICROSCOPIC
Bilirubin Urine: NEGATIVE
Glucose, UA: NEGATIVE mg/dL
Hgb urine dipstick: NEGATIVE
Ketones, ur: 5 mg/dL — AB
Leukocytes,Ua: NEGATIVE
Nitrite: NEGATIVE
Protein, ur: NEGATIVE mg/dL
Specific Gravity, Urine: 1.025 (ref 1.005–1.030)
pH: 6 (ref 5.0–8.0)

## 2023-12-19 LAB — SURGICAL PCR SCREEN
MRSA, PCR: NEGATIVE
Staphylococcus aureus: NEGATIVE

## 2023-12-19 LAB — CBC WITH DIFFERENTIAL/PLATELET
Abs Immature Granulocytes: 0.01 10*3/uL (ref 0.00–0.07)
Basophils Absolute: 0 10*3/uL (ref 0.0–0.1)
Basophils Relative: 1 %
Eosinophils Absolute: 0 10*3/uL (ref 0.0–0.5)
Eosinophils Relative: 1 %
HCT: 34.8 % — ABNORMAL LOW (ref 36.0–46.0)
Hemoglobin: 11.1 g/dL — ABNORMAL LOW (ref 12.0–15.0)
Immature Granulocytes: 0 %
Lymphocytes Relative: 42 %
Lymphs Abs: 1.9 10*3/uL (ref 0.7–4.0)
MCH: 26.7 pg (ref 26.0–34.0)
MCHC: 31.9 g/dL (ref 30.0–36.0)
MCV: 83.9 fL (ref 80.0–100.0)
Monocytes Absolute: 0.3 10*3/uL (ref 0.1–1.0)
Monocytes Relative: 6 %
Neutro Abs: 2.2 10*3/uL (ref 1.7–7.7)
Neutrophils Relative %: 50 %
Platelets: 193 10*3/uL (ref 150–400)
RBC: 4.15 MIL/uL (ref 3.87–5.11)
RDW: 15.3 % (ref 11.5–15.5)
WBC: 4.4 10*3/uL (ref 4.0–10.5)
nRBC: 0 % (ref 0.0–0.2)

## 2023-12-19 NOTE — Patient Instructions (Addendum)
 Your procedure is scheduled on: 12/28/23 - Thursday Report to the Registration Desk on the 1st floor of the [facility]. To find out your arrival time, please call [phone] between 1PM - 3PM on: 12/27/23 - Wednesday If your arrival time is 6:00 am, do not arrive before that time as the [facility] entrance doors do not open until 6:00 am.  REMEMBER: Instructions that are not followed completely may result in serious medical risk, up to and including death; or upon the discretion of your surgeon and anesthesiologist your surgery may need to be rescheduled.  Do not eat food after midnight the night before surgery.  No gum chewing or hard candies.  You may however, drink CLEAR liquids up to 2 hours before you are scheduled to arrive for your surgery. Do not drink anything within 2 hours of your scheduled arrival time.  Clear liquids include: - water  - apple juice without pulp - gatorade (not RED colors) - black coffee or tea (Do NOT add milk or creamers to the coffee or tea) Do NOT drink anything that is not on this list.  In addition, your doctor has ordered for you to drink the provided:  Ensure Pre-Surgery Clear Carbohydrate Drink  Drinking this carbohydrate drink up to two hours before surgery helps to reduce insulin resistance and improve patient outcomes. Please complete drinking 2 hours before scheduled arrival time.  One week prior to surgery: Stop Anti-inflammatories (NSAIDS) such as Advil , Aleve, Ibuprofen , Motrin , Naproxen, Naprosyn and Aspirin based products such as Excedrin, Goody's Powder, BC Powder. You may take Tylenol  if needed for pain up until the day of surgery.  Stop ANY OVER THE COUNTER supplements until after surgery.  ON THE DAY OF SURGERY ONLY TAKE THESE MEDICATIONS WITH SIPS OF WATER:  Fluticasone -Salmeterol (ADVAIR DISKUS)    Use inhaler albuterol  (PROVENTIL )  on the day of surgery and bring to the hospital.   No Alcohol for 24 hours before or  after surgery.  No Smoking including e-cigarettes for 24 hours before surgery.  No chewable tobacco products for at least 6 hours before surgery.  No nicotine patches on the day of surgery.  Do not use any recreational drugs for at least a week (preferably 2 weeks) before your surgery.  Please be advised that the combination of cocaine and anesthesia may have negative outcomes, up to and including death. If you test positive for cocaine, your surgery will be cancelled.  On the morning of surgery brush your teeth with toothpaste and water, you may rinse your mouth with mouthwash if you wish. Do not swallow any toothpaste or mouthwash.  Use CHG Soap or wipes as directed on instruction sheet.  Do not wear jewelry, make-up, hairpins, clips or nail polish.  For welded (permanent) jewelry: bracelets, anklets, waist bands, etc.  Please have this removed prior to surgery.  If it is not removed, there is a chance that hospital personnel will need to cut it off on the day of surgery.  Do not wear lotions, powders, or perfumes.   Do not shave body hair from the neck down 48 hours before surgery.  Contact lenses, hearing aids and dentures may not be worn into surgery.  Do not bring valuables to the hospital. [facility] is not responsible for any missing/lost belongings or valuables.   Notify your doctor if there is any change in your medical condition (cold, fever, infection).  Wear comfortable clothing (specific to your surgery type) to the hospital.  After  surgery, you can help prevent lung complications by doing breathing exercises.  Take deep breaths and cough every 1-2 hours. Your doctor may order a device called an Incentive Spirometer to help you take deep breaths.  When coughing or sneezing, hold a pillow firmly against your incision with both hands. This is called "splinting." Doing this helps protect your incision. It also decreases belly discomfort.  If you are being admitted to  the hospital overnight, leave your suitcase in the car. After surgery it may be brought to your room.  In case of increased patient census, it may be necessary for you, the patient, to continue your postoperative care in the Same Day Surgery department.  If you are being discharged the day of surgery, you will not be allowed to drive home. You will need a responsible individual to drive you home and stay with you for 24 hours after surgery.   If you are taking public transportation, you will need to have a responsible individual with you.  Please call the Pre-admissions Testing Dept. at [phone] if you have any questions about these instructions.  Surgery Visitation Policy:  Patients having surgery or a procedure may have two visitors.  Children under the age of 44 must have an adult with them who is not the patient.  Inpatient Visitation:    Visiting hours are 7 a.m. to 8 p.m. Up to four visitors are allowed at one time in a patient room. The visitors may rotate out with other people during the day.  One visitor age 46 or older may stay with the patient overnight and must be in the room by 8 p.m.   Merchandiser, retail to address health-related social needs:  [email or web address]    Pre-operative 5 CHG Bath Instructions   You can play a key role in reducing the risk of infection after surgery. Your skin needs to be as free of germs as possible. You can reduce the number of germs on your skin by washing with CHG (chlorhexidine  gluconate) soap before surgery. CHG is an antiseptic soap that kills germs and continues to kill germs even after washing.   DO NOT use if you have an allergy  to chlorhexidine /CHG or antibacterial soaps. If your skin becomes reddened or irritated, stop using the CHG and notify one of our RNs at [phone].   Please shower with the CHG soap starting 4 days before surgery using the following schedule: 07/06 - 07/10.    Please keep  in mind the following:  DO NOT shave, including legs and underarms, starting the day of your first shower.   You may shave your face at any point before/day of surgery.  Place clean sheets on your bed the day you start using CHG soap. Use a clean washcloth (not used since being washed) for each shower. DO NOT sleep with pets once you start using the CHG.   CHG Shower Instructions:  If you choose to wash your hair and private area, wash first with your normal shampoo/soap.  After you use shampoo/soap, rinse your hair and body thoroughly to remove shampoo/soap residue.  Turn the water OFF and apply about 3 tablespoons (45 ml) of CHG soap to a CLEAN washcloth.  Apply CHG soap ONLY FROM YOUR NECK DOWN TO YOUR TOES (washing for 3-5 minutes)  DO NOT use CHG soap on face, private areas, open wounds, or sores.  Pay special attention to the area where your surgery is being performed.  If you are having back  surgery, having someone wash your back for you may be helpful. Wait 2 minutes after CHG soap is applied, then you may rinse off the CHG soap.  Pat dry with a clean towel  Put on clean clothes/pajamas   If you choose to wear lotion, please use ONLY the CHG-compatible lotions on the back of this paper.     Additional instructions for the day of surgery: DO NOT APPLY any lotions, deodorants, cologne, or perfumes.   Put on clean/comfortable clothes.  Brush your teeth.  Ask your nurse before applying any prescription medications to the skin.      CHG Compatible Lotions   Aveeno Moisturizing lotion  Cetaphil Moisturizing Cream  Cetaphil Moisturizing Lotion  Clairol Herbal Essence Moisturizing Lotion, Dry Skin  Clairol Herbal Essence Moisturizing Lotion, Extra Dry Skin  Clairol Herbal Essence Moisturizing Lotion, Normal Skin  Curel Age Defying Therapeutic Moisturizing Lotion with Alpha Hydroxy  Curel Extreme Care Body Lotion  Curel Soothing Hands Moisturizing Hand Lotion  Curel  Therapeutic Moisturizing Cream, Fragrance-Free  Curel Therapeutic Moisturizing Lotion, Fragrance-Free  Curel Therapeutic Moisturizing Lotion, Original Formula  Eucerin Daily Replenishing Lotion  Eucerin Dry Skin Therapy Plus Alpha Hydroxy Crme  Eucerin Dry Skin Therapy Plus Alpha Hydroxy Lotion  Eucerin Original Crme  Eucerin Original Lotion  Eucerin Plus Crme Eucerin Plus Lotion  Eucerin TriLipid Replenishing Lotion  Keri Anti-Bacterial Hand Lotion  Keri Deep Conditioning Original Lotion Dry Skin Formula Softly Scented  Keri Deep Conditioning Original Lotion, Fragrance Free Sensitive Skin Formula  Keri Lotion Fast Absorbing Fragrance Free Sensitive Skin Formula  Keri Lotion Fast Absorbing Softly Scented Dry Skin Formula  Keri Original Lotion  Keri Skin Renewal Lotion Keri Silky Smooth Lotion  Keri Silky Smooth Sensitive Skin Lotion  Nivea Body Creamy Conditioning Oil  Nivea Body Extra Enriched Lotion  Nivea Body Original Lotion  Nivea Body Sheer Moisturizing Lotion Nivea Crme  Nivea Skin Firming Lotion  NutraDerm 30 Skin Lotion  NutraDerm Skin Lotion  NutraDerm Therapeutic Skin Cream  NutraDerm Therapeutic Skin Lotion  ProShield Protective Hand Cream  Provon moisturizing lotion  How to Use an Incentive Spirometer  An incentive spirometer is a tool that measures how well you are filling your lungs with each breath. Learning to take long, deep breaths using this tool can help you keep your lungs clear and active. This may help to reverse or lessen your chance of developing breathing (pulmonary) problems, especially infection. You may be asked to use a spirometer: After a surgery. If you have a lung problem or a history of smoking. After a long period of time when you have been unable to move or be active. If the spirometer includes an indicator to show the highest number that you have reached, your health care provider or respiratory therapist will help you set a goal. Keep a  log of your progress as told by your health care provider. What are the risks? Breathing too quickly may cause dizziness or cause you to pass out. Take your time so you do not get dizzy or light-headed. If you are in pain, you may need to take pain medicine before doing incentive spirometry. It is harder to take a deep breath if you are having pain. How to use your incentive spirometer  Sit up on the edge of your bed or on a chair. Hold the incentive spirometer so that it is in an upright position. Before you use the spirometer, breathe out normally. Place the mouthpiece in your mouth. Make  sure your lips are closed tightly around it. Breathe in slowly and as deeply as you can through your mouth, causing the piston or the ball to rise toward the top of the chamber. Hold your breath for 3-5 seconds, or for as long as possible. If the spirometer includes a coach indicator, use this to guide you in breathing. Slow down your breathing if the indicator goes above the marked areas. Remove the mouthpiece from your mouth and breathe out normally. The piston or ball will return to the bottom of the chamber. Rest for a few seconds, then repeat the steps 10 or more times. Take your time and take a few normal breaths between deep breaths so that you do not get dizzy or light-headed. Do this every 1-2 hours when you are awake. If the spirometer includes a goal marker to show the highest number you have reached (best effort), use this as a goal to work toward during each repetition. After each set of 10 deep breaths, cough a few times. This will help to make sure that your lungs are clear. If you have an incision on your chest or abdomen from surgery, place a pillow or a rolled-up towel firmly against the incision when you cough. This can help to reduce pain while taking deep breaths and coughing. General tips When you are able to get out of bed: Walk around often. Continue to take deep breaths and cough in  order to clear your lungs. Keep using the incentive spirometer until your health care provider says it is okay to stop using it. If you have been in the hospital, you may be told to keep using the spirometer at home. Contact a health care provider if: You are having difficulty using the spirometer. You have trouble using the spirometer as often as instructed. Your pain medicine is not giving enough relief for you to use the spirometer as told. You have a fever. Get help right away if: You develop shortness of breath. You develop a cough with bloody mucus from the lungs. You have fluid or blood coming from an incision site after you cough. Summary An incentive spirometer is a tool that can help you learn to take long, deep breaths to keep your lungs clear and active. You may be asked to use a spirometer after a surgery, if you have a lung problem or a history of smoking, or if you have been inactive for a long period of time. Use your incentive spirometer as instructed every 1-2 hours while you are awake. If you have an incision on your chest or abdomen, place a pillow or a rolled-up towel firmly against your incision when you cough. This will help to reduce pain. Get help right away if you have shortness of breath, you cough up bloody mucus, or blood comes from your incision when you cough. This information is not intended to replace advice given to you by your health care provider. Make sure you discuss any questions you have with your health care provider. Document Revised: 08/26/2019 Document Reviewed: 08/26/2019 Elsevier Patient Education  2023 Elsevier Inc.   POLAR CARE INFORMATION  MassAdvertisement.it  How to use [facility] Therapy System?  YouTube   ShippingScam.co.uk  OPERATING INSTRUCTIONS  Start the product With dry hands, connect the transformer to the electrical connection located on the top of the cooler. Next, plug the transformer into  an appropriate electrical outlet. The unit will automatically start running at this point.  To  stop the pump, disconnect electrical power.  Unplug to stop the product when not in use. Unplugging the Polar Care unit turns it off. Always unplug immediately after use. Never leave it plugged in while unattended. Remove pad.    FIRST ADD WATER TO FILL LINE, THEN ICE---Replace ice when existing ice is almost melted  1 Discuss Treatment with your Licensed Health Care Practitioner and Use Only as Prescribed 2 Apply Insulation Barrier & Cold Therapy Pad 3 Check for Moisture 4 Inspect Skin Regularly  Tips and Trouble Shooting Usage Tips 1. Use cubed or chunked ice for optimal performance. 2. It is recommended to drain the Pad between uses. To drain the pad, hold the Pad upright with the hose pointed toward the ground. Depress the black plunger and allow water to drain out. 3. You may disconnect the Pad from the unit without removing the pad from the affected area by depressing the silver  tabs on the hose coupling and gently pulling the hoses apart. The Pad and unit will seal itself and will not leak. Note: Some dripping during release is normal. 4. DO NOT RUN PUMP WITHOUT WATER! The pump in this unit is designed to run with water. Running the unit without water will cause permanent damage to the pump. 5. Unplug unit before removing lid.  TROUBLESHOOTING GUIDE Pump not running, Water not flowing to the pad, Pad is not getting cold 1. Make sure the transformer is plugged into the wall outlet. 2. Confirm that the ice and water are filled to the indicated levels. 3. Make sure there are no kinks in the pad. 4. Gently pull on the blue tube to make sure the tube/pad junction is straight. 5. Remove the pad from the treatment site and ll it while the pad is lying at; then reapply. 6. Confirm that the pad couplings are securely attached to the unit. Listen for the double clicks (Figure 1) to confirm the pad  couplings are securely attached.  Leaks    Note: Some condensation on the lines, controller, and pads is unavoidable, especially in warmer climates. 1. If using a [employer] Polar Care Cold Therapy unit with a detachable Cold Therapy Pad, and a leak exists (other than condensation on the lines) disconnect the pad couplings. Make sure the silver  tabs on the couplings are depressed before reconnecting the pad to the pump hose; then confirm both sides of the coupling are properly clicked in. 2. If the coupling continues to leak or a leak is detected in the pad itself, stop using it and call [employer] Customer Care at [phone].  Cleaning After use, empty and dry the unit with a soft cloth. Warm water and mild detergent may be used occasionally to clean the pump and tubes.  WARNING: The Polar Care Cube can be cold enough to cause serious injury, including full skin necrosis. Follow these Operating Instructions, and carefully read the Product Insert (see pouch on side of unit) and the Cold Therapy Pad Fitting Instructions (provided with each Cold Therapy Pad) prior to use.     Class Available On 12/21/23 - Thursday

## 2024-01-03 ENCOUNTER — Ambulatory Visit: Payer: Self-pay | Admitting: Urgent Care

## 2024-01-03 ENCOUNTER — Encounter: Payer: Self-pay | Admitting: Surgery

## 2024-01-03 ENCOUNTER — Encounter
Admission: RE | Disposition: A | Discharge status: Home / Self Care | Payer: Self-pay | Source: Home / Self Care | Attending: Surgery

## 2024-01-03 ENCOUNTER — Other Ambulatory Visit: Payer: Self-pay

## 2024-01-03 ENCOUNTER — Ambulatory Visit: Admitting: Anesthesiology

## 2024-01-03 ENCOUNTER — Ambulatory Visit
Admission: RE | Admit: 2024-01-03 | Discharge: 2024-01-03 | Disposition: A | Discharge status: Home / Self Care | Attending: Surgery | Admitting: Surgery

## 2024-01-03 ENCOUNTER — Ambulatory Visit

## 2024-01-03 DIAGNOSIS — M1711 Unilateral primary osteoarthritis, right knee: Secondary | ICD-10-CM | POA: Diagnosis present

## 2024-01-03 DIAGNOSIS — Z01812 Encounter for preprocedural laboratory examination: Secondary | ICD-10-CM

## 2024-01-03 HISTORY — PX: TOTAL KNEE ARTHROPLASTY: SHX125

## 2024-01-03 LAB — POCT PREGNANCY, URINE: Preg Test, Ur: NEGATIVE

## 2024-01-03 SURGERY — ARTHROPLASTY, KNEE, TOTAL
Anesthesia: General | Site: Knee | Laterality: Right

## 2024-01-03 MED ORDER — OXYCODONE HCL 5 MG/5ML PO SOLN: 5.0000 mg | Freq: Once | ORAL | Status: AC | PRN

## 2024-01-03 MED ORDER — HYDROMORPHONE HCL 1 MG/ML IJ SOLN
INTRAMUSCULAR | Status: DC | PRN
  Administered 2024-01-03: 1 mg via INTRAVENOUS

## 2024-01-03 MED ORDER — PROPOFOL 1000 MG/100ML IV EMUL
INTRAVENOUS | Status: AC
  Filled 2024-01-03: qty 100

## 2024-01-03 MED ORDER — OXYCODONE HCL 5 MG PO TABS: 5.0000 mg | ORAL_TABLET | ORAL | 0 refills | Status: AC | PRN

## 2024-01-03 MED ORDER — CEFAZOLIN SODIUM-DEXTROSE 2-4 GM/100ML-% IV SOLN
2.0000 g | INTRAVENOUS | Status: AC
  Administered 2024-01-03: 2 g via INTRAVENOUS

## 2024-01-03 MED ORDER — MIDAZOLAM HCL 2 MG/2ML IJ SOLN
INTRAMUSCULAR | Status: DC | PRN
  Administered 2024-01-03: 2 mg via INTRAVENOUS

## 2024-01-03 MED ORDER — MIDAZOLAM HCL 2 MG/2ML IJ SOLN
INTRAMUSCULAR | Status: AC
  Filled 2024-01-03: qty 2

## 2024-01-03 MED ORDER — FENTANYL CITRATE (PF) 100 MCG/2ML IJ SOLN
25.0000 ug | INTRAMUSCULAR | Status: AC | PRN
  Administered 2024-01-03 (×8): 25 ug via INTRAVENOUS

## 2024-01-03 MED ORDER — LACTATED RINGERS IV SOLN: INTRAVENOUS | Status: DC

## 2024-01-03 MED ORDER — KETAMINE HCL 50 MG/5ML IJ SOSY
PREFILLED_SYRINGE | INTRAMUSCULAR | Status: AC
  Filled 2024-01-03: qty 5

## 2024-01-03 MED ORDER — CHLORHEXIDINE GLUCONATE 0.12 % MT SOLN
15.0000 mL | Freq: Once | OROMUCOSAL | Status: AC
  Administered 2024-01-03: 15 mL via OROMUCOSAL

## 2024-01-03 MED ORDER — CHLORHEXIDINE GLUCONATE 0.12 % MT SOLN
OROMUCOSAL | Status: AC
  Filled 2024-01-03: qty 15

## 2024-01-03 MED ORDER — ACETAMINOPHEN 325 MG PO TABS: 325.0000 mg | ORAL_TABLET | Freq: Four times a day (QID) | ORAL | Status: DC | PRN

## 2024-01-03 MED ORDER — HYDROMORPHONE HCL 1 MG/ML IJ SOLN
INTRAMUSCULAR | Status: AC
Start: 2024-01-03 — End: 2024-01-03
  Filled 2024-01-03: qty 1

## 2024-01-03 MED ORDER — KETOROLAC TROMETHAMINE 30 MG/ML IJ SOLN
INTRAMUSCULAR | Status: AC
  Filled 2024-01-03: qty 1

## 2024-01-03 MED ORDER — DROPERIDOL 2.5 MG/ML IJ SOLN: 0.6250 mg | Freq: Once | INTRAMUSCULAR | Status: DC | PRN

## 2024-01-03 MED ORDER — LIDOCAINE HCL (PF) 2 % IJ SOLN
INTRAMUSCULAR | Status: AC
Start: 2024-01-03 — End: 2024-01-03
  Filled 2024-01-03: qty 5

## 2024-01-03 MED ORDER — CEFAZOLIN SODIUM-DEXTROSE 2-4 GM/100ML-% IV SOLN
INTRAVENOUS | Status: AC
Start: 2024-01-03 — End: 2024-01-03
  Filled 2024-01-03: qty 100

## 2024-01-03 MED ORDER — TRANEXAMIC ACID-NACL 1000-0.7 MG/100ML-% IV SOLN
INTRAVENOUS | Status: AC
  Filled 2024-01-03: qty 100

## 2024-01-03 MED ORDER — DIPHENHYDRAMINE HCL 50 MG/ML IJ SOLN
INTRAMUSCULAR | Status: AC
  Filled 2024-01-03: qty 1

## 2024-01-03 MED ORDER — BUPIVACAINE-EPINEPHRINE (PF) 0.5% -1:200000 IJ SOLN
INTRAMUSCULAR | Status: DC | PRN
  Administered 2024-01-03: 30 mL

## 2024-01-03 MED ORDER — ROCURONIUM BROMIDE 10 MG/ML (PF) SYRINGE
PREFILLED_SYRINGE | INTRAVENOUS | Status: AC
  Filled 2024-01-03: qty 10

## 2024-01-03 MED ORDER — TRANEXAMIC ACID-NACL 1000-0.7 MG/100ML-% IV SOLN
1000.0000 mg | INTRAVENOUS | Status: AC
  Administered 2024-01-03: 1000 mg via INTRAVENOUS

## 2024-01-03 MED ORDER — APIXABAN 2.5 MG PO TABS: 2.5000 mg | ORAL_TABLET | Freq: Two times a day (BID) | ORAL | 0 refills | Status: AC

## 2024-01-03 MED ORDER — SUGAMMADEX SODIUM 500 MG/5ML IV SOLN
INTRAVENOUS | Status: DC | PRN
  Administered 2024-01-03: 200 mg via INTRAVENOUS

## 2024-01-03 MED ORDER — LIDOCAINE HCL (CARDIAC) PF 100 MG/5ML IV SOSY
PREFILLED_SYRINGE | INTRAVENOUS | Status: DC | PRN
  Administered 2024-01-03: 100 mg via INTRAVENOUS

## 2024-01-03 MED ORDER — BUPIVACAINE HCL (PF) 0.5 % IJ SOLN
INTRAMUSCULAR | Status: AC
Start: 2024-01-03 — End: 2024-01-03
  Filled 2024-01-03: qty 10

## 2024-01-03 MED ORDER — PROPOFOL 500 MG/50ML IV EMUL
INTRAVENOUS | Status: DC | PRN
  Administered 2024-01-03: 150 ug/kg/min via INTRAVENOUS

## 2024-01-03 MED ORDER — ACETAMINOPHEN 10 MG/ML IV SOLN
1000.0000 mg | Freq: Once | INTRAVENOUS | Status: DC | PRN
  Administered 2024-01-03: 1000 mg via INTRAVENOUS

## 2024-01-03 MED ORDER — ONDANSETRON HCL 4 MG/2ML IJ SOLN
INTRAMUSCULAR | Status: DC | PRN
Start: 2024-01-03 — End: 2024-01-03
  Administered 2024-01-03: 4 mg via INTRAVENOUS

## 2024-01-03 MED ORDER — TRIAMCINOLONE ACETONIDE 40 MG/ML IJ SUSP
INTRAMUSCULAR | Status: AC
  Filled 2024-01-03: qty 2

## 2024-01-03 MED ORDER — FENTANYL CITRATE (PF) 100 MCG/2ML IJ SOLN
INTRAMUSCULAR | Status: AC
  Filled 2024-01-03: qty 2

## 2024-01-03 MED ORDER — METOCLOPRAMIDE HCL 5 MG/ML IJ SOLN: 5.0000 mg | Freq: Three times a day (TID) | INTRAMUSCULAR | Status: DC | PRN

## 2024-01-03 MED ORDER — DIPHENHYDRAMINE HCL 50 MG/ML IJ SOLN
12.5000 mg | Freq: Once | INTRAMUSCULAR | Status: AC
  Administered 2024-01-03: 12.5 mg via INTRAVENOUS

## 2024-01-03 MED ORDER — SODIUM CHLORIDE 0.9 % IR SOLN
Status: DC | PRN
Start: 2024-01-03 — End: 2024-01-03
  Administered 2024-01-03: 3000 mL

## 2024-01-03 MED ORDER — SODIUM CHLORIDE 0.9 % IV SOLN: INTRAVENOUS | Status: DC

## 2024-01-03 MED ORDER — OXYCODONE HCL 5 MG PO TABS
ORAL_TABLET | ORAL | Status: AC
Start: 2024-01-03 — End: 2024-01-03
  Filled 2024-01-03: qty 1

## 2024-01-03 MED ORDER — PROPOFOL 10 MG/ML IV BOLUS
INTRAVENOUS | Status: DC | PRN
  Administered 2024-01-03: 150 mg via INTRAVENOUS

## 2024-01-03 MED ORDER — BUPIVACAINE-EPINEPHRINE (PF) 0.5% -1:200000 IJ SOLN
INTRAMUSCULAR | Status: AC
  Filled 2024-01-03: qty 30

## 2024-01-03 MED ORDER — OXYCODONE HCL 5 MG PO TABS
5.0000 mg | ORAL_TABLET | Freq: Once | ORAL | Status: AC
  Administered 2024-01-03: 5 mg via ORAL

## 2024-01-03 MED ORDER — ACETAMINOPHEN 10 MG/ML IV SOLN
INTRAVENOUS | Status: AC
Start: 2024-01-03 — End: 2024-01-03
  Filled 2024-01-03: qty 100

## 2024-01-03 MED ORDER — DEXAMETHASONE SODIUM PHOSPHATE 10 MG/ML IJ SOLN
INTRAMUSCULAR | Status: DC | PRN
  Administered 2024-01-03: 4 mg via INTRAVENOUS

## 2024-01-03 MED ORDER — TRIAMCINOLONE ACETONIDE 40 MG/ML IJ SUSP
INTRAMUSCULAR | Status: DC | PRN
  Administered 2024-01-03: 80 mg via INTRAMUSCULAR

## 2024-01-03 MED ORDER — KETAMINE HCL 50 MG/5ML IJ SOSY
PREFILLED_SYRINGE | INTRAMUSCULAR | Status: DC | PRN
  Administered 2024-01-03: 30 mg via INTRAVENOUS

## 2024-01-03 MED ORDER — CEFAZOLIN SODIUM-DEXTROSE 2-4 GM/100ML-% IV SOLN
2.0000 g | Freq: Four times a day (QID) | INTRAVENOUS | Status: DC
  Administered 2024-01-03: 2 g via INTRAVENOUS

## 2024-01-03 MED ORDER — ROCURONIUM BROMIDE 100 MG/10ML IV SOLN
INTRAVENOUS | Status: DC | PRN
  Administered 2024-01-03: 60 mg via INTRAVENOUS

## 2024-01-03 MED ORDER — FENTANYL CITRATE (PF) 100 MCG/2ML IJ SOLN
INTRAMUSCULAR | Status: DC | PRN
  Administered 2024-01-03 (×2): 50 ug via INTRAVENOUS

## 2024-01-03 MED ORDER — ONDANSETRON HCL 4 MG/2ML IJ SOLN
4.0000 mg | Freq: Four times a day (QID) | INTRAMUSCULAR | Status: DC | PRN
Start: 2024-01-03 — End: 2024-01-03

## 2024-01-03 MED ORDER — SODIUM CHLORIDE 0.9 % IV SOLN
INTRAVENOUS | Status: DC | PRN
  Administered 2024-01-03: 60 mL

## 2024-01-03 MED ORDER — OXYCODONE HCL 5 MG PO TABS
5.0000 mg | ORAL_TABLET | Freq: Once | ORAL | Status: AC | PRN
  Administered 2024-01-03: 5 mg via ORAL

## 2024-01-03 MED ORDER — OXYCODONE HCL 5 MG PO TABS
ORAL_TABLET | ORAL | Status: AC
  Filled 2024-01-03: qty 1

## 2024-01-03 MED ORDER — PROPOFOL 1000 MG/100ML IV EMUL
INTRAVENOUS | Status: AC
Start: 2024-01-03 — End: 2024-01-03
  Filled 2024-01-03: qty 100

## 2024-01-03 MED ORDER — BUPIVACAINE HCL (PF) 0.5 % IJ SOLN
INTRAMUSCULAR | Status: AC
  Filled 2024-01-03: qty 10

## 2024-01-03 MED ORDER — PROPOFOL 10 MG/ML IV BOLUS
INTRAVENOUS | Status: AC
Start: 2024-01-03 — End: 2024-01-03
  Filled 2024-01-03: qty 20

## 2024-01-03 MED ORDER — BUPIVACAINE LIPOSOME 1.3 % IJ SUSP
INTRAMUSCULAR | Status: AC
  Filled 2024-01-03: qty 20

## 2024-01-03 MED ORDER — OXYCODONE HCL 5 MG PO TABS: 5.0000 mg | ORAL_TABLET | ORAL | Status: DC | PRN

## 2024-01-03 MED ORDER — KETOROLAC TROMETHAMINE 30 MG/ML IJ SOLN
30.0000 mg | Freq: Once | INTRAMUSCULAR | Status: AC
  Administered 2024-01-03: 30 mg via INTRAVENOUS

## 2024-01-03 MED ORDER — KETOROLAC TROMETHAMINE 30 MG/ML IJ SOLN
INTRAMUSCULAR | Status: DC | PRN
  Administered 2024-01-03: 30 mg via INTRAMUSCULAR

## 2024-01-03 MED ORDER — SODIUM CHLORIDE (PF) 0.9 % IJ SOLN
INTRAMUSCULAR | Status: AC
  Filled 2024-01-03: qty 40

## 2024-01-03 MED ORDER — GLYCOPYRROLATE 0.2 MG/ML IJ SOLN
INTRAMUSCULAR | Status: DC | PRN
  Administered 2024-01-03: .1 mg via INTRAVENOUS

## 2024-01-03 MED ORDER — METOCLOPRAMIDE HCL 10 MG PO TABS: 5.0000 mg | ORAL_TABLET | Freq: Three times a day (TID) | ORAL | Status: DC | PRN

## 2024-01-03 MED ORDER — ORAL CARE MOUTH RINSE
15.0000 mL | Freq: Once | OROMUCOSAL | Status: AC
Start: 2024-01-03 — End: 2024-01-03

## 2024-01-03 MED ORDER — ONDANSETRON HCL 4 MG PO TABS: 4.0000 mg | ORAL_TABLET | Freq: Four times a day (QID) | ORAL | Status: DC | PRN

## 2024-01-03 SURGICAL SUPPLY — 52 items
BLADE SAW 90X13X1.19 OSCILLAT (BLADE) ×1 IMPLANT
BLADE SAW SAG 25X90X1.19 (BLADE) ×1 IMPLANT
BLADE SURG SZ20 CARB STEEL (BLADE) ×1 IMPLANT
BNDG COMPR 6X5.8 VLCR NS LF (GAUZE/BANDAGES/DRESSINGS) ×1 IMPLANT
CEMENT VACUUM MIXING SYSTEM (MISCELLANEOUS) ×1 IMPLANT
CHLORAPREP W/TINT 26 (MISCELLANEOUS) ×1 IMPLANT
COMPONENT FEM PS KNEE NRW 7 RT (Joint) IMPLANT
COMPONENT PATELLA PEG 3 32 (Joint) IMPLANT
COOLER ICEMAN CLASSIC (MISCELLANEOUS) ×1 IMPLANT
COVER MAYO STAND STRL (DRAPES) ×1 IMPLANT
CUFF TRNQT CYL 24X4X16.5-23 (TOURNIQUET CUFF) IMPLANT
CUFF TRNQT CYL 34X4.125X (TOURNIQUET CUFF) IMPLANT
DRAPE IMP U-DRAPE 54X76 (DRAPES) ×1 IMPLANT
DRAPE SHEET LG 3/4 BI-LAMINATE (DRAPES) ×1 IMPLANT
DRAPE U-SHAPE 47X51 STRL (DRAPES) ×1 IMPLANT
DRSG MEPILEX SACRM 8.7X9.8 (GAUZE/BANDAGES/DRESSINGS) IMPLANT
DRSG OPSITE POSTOP 4X10 (GAUZE/BANDAGES/DRESSINGS) ×1 IMPLANT
DRSG OPSITE POSTOP 4X8 (GAUZE/BANDAGES/DRESSINGS) ×1 IMPLANT
ELECT CAUTERY BLADE 6.4 (BLADE) ×1 IMPLANT
ELECTRODE REM PT RTRN 9FT ADLT (ELECTROSURGICAL) ×1 IMPLANT
GAUZE XEROFORM 1X8 LF (GAUZE/BANDAGES/DRESSINGS) ×1 IMPLANT
GLOVE BIO SURGEON STRL SZ7.5 (GLOVE) ×4 IMPLANT
GLOVE BIO SURGEON STRL SZ8 (GLOVE) ×4 IMPLANT
GLOVE BIOGEL PI IND STRL 8 (GLOVE) ×1 IMPLANT
GLOVE INDICATOR 8.0 STRL GRN (GLOVE) ×1 IMPLANT
GOWN STRL REUS W/ TWL LRG LVL3 (GOWN DISPOSABLE) ×1 IMPLANT
GOWN STRL REUS W/ TWL XL LVL3 (GOWN DISPOSABLE) ×1 IMPLANT
HOOD PEEL AWAY T7 (MISCELLANEOUS) ×3 IMPLANT
KIT TURNOVER KIT A (KITS) ×1 IMPLANT
LINER ASF PERS 10X6/7 CD RT (Liner) IMPLANT
MANIFOLD NEPTUNE II (INSTRUMENTS) ×1 IMPLANT
NDL SPNL 20GX3.5 QUINCKE YW (NEEDLE) ×1 IMPLANT
NEEDLE SPNL 20GX3.5 QUINCKE YW (NEEDLE) ×1 IMPLANT
NS IRRIG 500ML POUR BTL (IV SOLUTION) ×1 IMPLANT
PACK TOTAL KNEE (MISCELLANEOUS) ×1 IMPLANT
PAD COLD UNI WRAP-ON (PAD) ×1 IMPLANT
PENCIL SMOKE EVACUATOR (MISCELLANEOUS) ×1 IMPLANT
PIN DRILL HDLS TROCAR 75 4PK (PIN) IMPLANT
SCREW FEMALE HEX FIX 25X2.5 (ORTHOPEDIC DISPOSABLE SUPPLIES) IMPLANT
SOL .9 NS 3000ML IRR UROMATIC (IV SOLUTION) ×1 IMPLANT
STAPLER SKIN PROX 35W (STAPLE) ×1 IMPLANT
STEM TIB PS KNEE D 0D RT (Stem) IMPLANT
STOCKINETTE IMPERV 14X48 (MISCELLANEOUS) ×1 IMPLANT
SUCTION TUBE FRAZIER 10FR DISP (SUCTIONS) ×1 IMPLANT
SUT VIC AB 0 CT1 36 (SUTURE) ×3 IMPLANT
SUT VIC AB 2-0 CT1 TAPERPNT 27 (SUTURE) ×3 IMPLANT
SYR 10ML LL (SYRINGE) ×1 IMPLANT
SYR 20ML LL LF (SYRINGE) ×1 IMPLANT
SYR 30ML LL (SYRINGE) IMPLANT
TIP FAN IRRIG PULSAVAC PLUS (DISPOSABLE) ×1 IMPLANT
TRAP FLUID SMOKE EVACUATOR (MISCELLANEOUS) ×1 IMPLANT
WATER STERILE IRR 500ML POUR (IV SOLUTION) ×1 IMPLANT

## 2024-01-03 NOTE — Progress Notes (Signed)
 This patient is not able to walk the distance required to go the bathroom, or she is unable to safely negotiate stairs required to access the bathroom.  A 3-in-1 BSC will alleviate this problem.

## 2024-01-03 NOTE — Anesthesia Postprocedure Evaluation (Signed)
 Anesthesia Post Note  Patient: Kara West  Procedure(s) Performed: ARTHROPLASTY, KNEE, TOTAL (Right: Knee)  Patient location during evaluation: PACU Anesthesia Type: General Level of consciousness: awake and alert Pain management: pain level controlled Vital Signs Assessment: post-procedure vital signs reviewed and stable Respiratory status: spontaneous breathing, nonlabored ventilation, respiratory function stable and patient connected to nasal cannula oxygen Cardiovascular status: blood pressure returned to baseline and stable Postop Assessment: no apparent nausea or vomiting Anesthetic complications: no   No notable events documented.   Last Vitals:  Vitals:   01/03/24 1431 01/03/24 1710  BP: 122/74 102/72  Pulse: 60 (!) 57  Resp: 14 16  Temp: 36.7 C 36.8 C  SpO2: 99% 100%    Last Pain:  Vitals:   01/03/24 1710  TempSrc: Temporal  PainSc: 4                  [place] KANDICE Clause

## 2024-01-03 NOTE — Op Note (Signed)
 01/03/2024  12:22 PM  Patient:   Kara West  Pre-Op Diagnosis:   Degenerative joint disease, right knee.  Post-Op Diagnosis:   Same  Procedure:   Right TKA using all-pressfit Zimmer Persona system with a #7 narrow PCR femur, a(n) D-sized  tibial tray with a 10 mm medial congruent E-poly insert, and a 9 x 32 mm all-poly 3-pegged domed patella.  Surgeon:   DOROTHA Reyes Maltos, MD  Assistant:   Toribio Alas, RNFA   Anesthesia:   GET  Findings:   As above  Complications:   None  EBL:   10 cc  Fluids:   500 cc crystalloid  UOP:   None  TT:   85 minutes at 300 mmHg  Drains:   None  Closure:   Staples  Implants:   As above  Brief Clinical Note:   The patient is a 48 year old female with a long history of progressively worsening right knee pain. The patient's symptoms have progressed despite medications, activity modification, injections, etc. The patient's history and examination were consistent with tricompartmental degenerative joint disease of the right knee confirmed by MRI scan. The patient presents at this time for a right total knee arthroplasty.  Procedure:   The patient was brought into the operating room and lain in the supine position. After adequate general endotracheal intubation and anesthesia was obtained, the right lower extremity was prepped with ChloraPrep solution and draped sterilely. Preoperative antibiotics were administered. A timeout was performed to verify the appropriate surgical site before the limb was exsanguinated with an Esmarch and the tourniquet inflated to 300 mmHg.   A standard anterior approach to the knee was made through an approximately 6-7 inch incision. The incision was carried down through the subcutaneous tissues to expose superficial retinaculum. This was split the length of the incision and the medial flap elevated sufficiently to expose the medial retinaculum. The medial retinaculum was incised, leaving a 3-4 mm cuff of tissue on  the patella. This was extended distally along the medial border of the patellar tendon and proximally through the medial third of the quadriceps tendon. A subtotal fat pad excision was performed before the soft tissues were elevated off the anteromedial and anterolateral aspects of the proximal tibia to the level of the collateral ligaments. The anterior portions of the medial and lateral menisci were removed, as was the anterior cruciate ligament. With the knee flexed to 90, the external tibial guide was positioned and the appropriate proximal tibial cut made. This piece was taken to the back table where it was measured and found to be optimally replicated by a(n) D-sized component.  Attention was directed to the distal femur. The intramedullary canal was accessed through a 3/8 drill hole. The intramedullary guide was inserted and placed at 5 of valgus alignment. Using the +0 slot, the distal cut was made. The distal femur was measured and found to be optimally replicated by the #7 component. The #7 4-in-1 cutting block was positioned and first the posterior, then the posterior chamfer, the anterior, and finally the anterior chamfer cuts were made after verifying that the anterior cortex would not be notched.   At this point, the posterior portions medial and lateral menisci were removed. A trial reduction was performed using the appropriate femoral and tibial components with the 10 mm insert. This demonstrated excellent stability to varus and valgus stressing both in flexion and extension while permitting full extension. Patellar tracking was assessed and found to be excellent. The tibial trial  position was marked on the proximal tibia. The patella thickness was measured and found to be 20 mm, so the appropriate cut was made. The patellar surface was measured and found to be optimally replicated by the 32 mm component. The three peg holes were drilled in place before the trial button was inserted. Patella  tracking was assessed and found to be excellent, passing the no thumb test. The lug holes were drilled into the distal femur before the trial component was removed.  The tibial tray was repositioned before the keel was created using the appropriate tower, drills, and punch.  The bony surfaces were prepared for implantation by irrigating them thoroughly with sterile saline solution via the jet lavage system. A bone plug was fashioned from some of the bone that had been removed previously and used to plug the distal femoral canal. In addition, a cocktail of 20 cc of Exparel , 30 cc of 0.5% Sensorcaine , 2 cc of Kenalog  40 (80 mg), and 30 mg of Toradol  diluted out to 90 cc with normal saline was injected into the postero-medial and postero-lateral aspects of the knee, the medial and lateral gutter regions, and the peri-incisional tissues to help with postoperative analgesia.    The tibial tray was impacted into place first with care taken to be sure the component was fully seated. Next, the femoral component was impacted into place again with care taken to be sure that the component was fully improperly seated. The permanent 10 mm medial congruent E-polyethylene insert was snapped into place with care taken to ensure appropriate locking of the insert. Finally, the patella was positioned and compressed into place using the patellar clamp. Again, care was taken to be sure that the component was fully seated. The knee was placed through a range of motion with the findings as described above.    The wound was copiously irrigated with sterile saline solution using the jet lavage system before the quadriceps tendon and retinacular layer were reapproximated using #0 Vicryl interrupted sutures. The superficial retinacular layer also was closed using a running #0 Vicryl suture. The subcutaneous tissues were closed in several layers using 2-0 Vicryl interrupted sutures. The skin was closed using staples. A sterile  honeycomb dressing was applied to the skin before the leg was wrapped with an Ace wrap to accommodate the Polar Care device. The patient was then awakened, extubated, and returned to the recovery room in satisfactory condition after tolerating the procedure well.

## 2024-01-03 NOTE — Anesthesia Procedure Notes (Signed)
 Procedure Name: Intubation Date/Time: 01/03/2024 10:29 AM  Performed by: Brien Sotero PARAS, CRNAPre-anesthesia Checklist: Patient identified, Patient being monitored, Timeout performed, Emergency Drugs available and Suction available Patient Re-evaluated:Patient Re-evaluated prior to induction Oxygen Delivery Method: Circle system utilized Preoxygenation: Pre-oxygenation with 100% oxygen Induction Type: IV induction Ventilation: Mask ventilation without difficulty Laryngoscope Size: Mac, 3 and McGrath Grade View: Grade I Tube type: Oral Tube size: 7.0 mm Number of attempts: 1 Airway Equipment and Method: Stylet Placement Confirmation: ETT inserted through vocal cords under direct vision, positive ETCO2 and breath sounds checked- equal and bilateral Secured at: 21 cm Tube secured with: Tape Dental Injury: Teeth and Oropharynx as per pre-operative assessment

## 2024-01-03 NOTE — Discharge Instructions (Addendum)
 Orthopedic discharge instructions: May shower with intact OpSite dressing. Apply ice frequently to knee or use Polar Care. Start Eliquis  1 tablet (2.5 mg) twice daily on Thursday, 01/03/2024, for 2 weeks, then take aspirin 325 mg (or 4 baby aspirin) twice daily for 4 weeks. Take pain medication as prescribed when needed.  May supplement with ES Tylenol  if necessary. May weight-bear as tolerated on right leg - use walker for balance and support. Follow-up in 10-14 days or as scheduled.   Information for Discharge Teaching: EXPAREL  (bupivacaine  liposome injectable suspension)   Pain relief is important to your recovery. The goal is to control your pain so you can move easier and return to your normal activities as soon as possible after your procedure. Your physician may use several types of medicines to manage pain, swelling, and more.  Your surgeon or anesthesiologist gave you EXPAREL (bupivacaine ) to help control your pain after surgery.  EXPAREL  is a local anesthetic designed to release slowly over an extended period of time to provide pain relief by numbing the tissue around the surgical site. EXPAREL  is designed to release pain medication over time and can control pain for up to 72 hours. Depending on how you respond to EXPAREL , you may require less pain medication during your recovery. EXPAREL  can help reduce or eliminate the need for opioids during the first few days after surgery when pain relief is needed the most. EXPAREL  is not an opioid and is not addictive. It does not cause sleepiness or sedation.   Important! A teal colored band has been placed on your arm with the date, time and amount of EXPAREL  you have received. Please leave this armband in place for the full 96 hours following administration, and then you may remove the band. If you return to the hospital for any reason within 96 hours following the administration of EXPAREL , the armband provides important information that  your health care providers to know, and alerts them that you have received this anesthetic.    Possible side effects of EXPAREL : Temporary loss of sensation or ability to move in the area where medication was injected. Nausea, vomiting, constipation Rarely, numbness and tingling in your mouth or lips, lightheadedness, or anxiety may occur. Call your doctor right away if you think you may be experiencing any of these sensations, or if you have other questions regarding possible side effects.  Follow all other discharge instructions given to you by your surgeon or nurse. Eat a healthy diet and drink plenty of water or other fluids.  See [employer] instruction sheet for cooler info and instructions

## 2024-01-03 NOTE — Transfer of Care (Signed)
 Immediate Anesthesia Transfer of Care Note  Patient: Kara West  Procedure(s) Performed: ARTHROPLASTY, KNEE, TOTAL (Right: Knee)  Patient Location: PACU  Anesthesia Type:General  Level of Consciousness: awake  Airway & Oxygen Therapy: Patient Spontanous Breathing  Post-op Assessment: Report given to RN and Post -op Vital signs reviewed and stable  Post vital signs: Reviewed and stable  Last Vitals:  Vitals Value Taken Time  BP 110/66 01/03/24 12:32  Temp    Pulse 67 01/03/24 12:36  Resp 20 01/03/24 12:36  SpO2 100 % 01/03/24 12:36  Vitals shown include unfiled device data.  Last Pain:  Vitals:   01/03/24 1235  TempSrc:   PainSc: 10-Worst pain ever         Complications: No notable events documented.

## 2024-01-03 NOTE — Anesthesia Preprocedure Evaluation (Signed)
 Anesthesia Evaluation  Patient identified by MRN, date of birth, ID band Patient awake    Reviewed: Allergy  & Precautions, H&P , NPO status , Patient's Chart, lab work & pertinent test results, reviewed documented beta blocker date and time   History of Anesthesia Complications (+) POST - OP SPINAL HEADACHE and history of anesthetic complications  Airway Mallampati: II   Neck ROM: full    Dental  (+) Poor Dentition   Pulmonary asthma , COPD, former smoker   Pulmonary exam normal        Cardiovascular Exercise Tolerance: Good negative cardio ROS Normal cardiovascular exam Rhythm:regular Rate:Normal     Neuro/Psych  Headaches  Neuromuscular disease  negative psych ROS   GI/Hepatic Neg liver ROS,GERD  ,,  Endo/Other  negative endocrine ROS    Renal/GU negative Renal ROS  negative genitourinary   Musculoskeletal   Abdominal   Peds  Hematology  (+) Blood dyscrasia, anemia   Anesthesia Other Findings Past Medical History: No date: Anemia     Comment:  s/p transfusion No date: Bronchitis No date: Complex tear of lateral meniscus of right knee as current  injury No date: COPD (chronic obstructive pulmonary disease) (HCC) No date: GERD (gastroesophageal reflux disease) No date: Headache No date: Primary osteoarthritis of right knee No date: Spinal headache No date: Thrombocytopenia (HCC) Past Surgical History: No date: CESAREAN SECTION     Comment:  x3 11/11/2011: DILATION AND CURETTAGE OF UTERUS     Comment:  hysteroscopy 11/11/2011: ENDOMETRIAL ABLATION No date: LAMINECTOMY LUMBAR SPLINE W/ PLACEMENT SPINAL CORD STIMULATOR 02/17/2020: THORACIC LAMINECTOMY FOR SPINAL CORD STIMULATOR; N/A     Comment:  Procedure: THORACIC SPINAL CORD STIMULATOR PERCUTANEOUS               WITH FLANK PULSE GENERATOR;  Surgeon: Bluford Standing, MD;                Location: [facility] ORS;  Service: Neurosurgery;  Laterality:                N/A; 1997: TUBAL LIGATION   Reproductive/Obstetrics negative OB ROS                              Anesthesia Physical Anesthesia Plan  ASA: 3  Anesthesia Plan: General ETT   Post-op Pain Management:    Induction:   PONV Risk Score and Plan:   Airway Management Planned:   Additional Equipment:   Intra-op Plan:   Post-operative Plan:   Informed Consent: I have reviewed the patients History and Physical, chart, labs and discussed the procedure including the risks, benefits and alternatives for the proposed anesthesia with the patient or authorized representative who has indicated his/her understanding and acceptance.     Dental Advisory Given  Plan Discussed with: CRNA  Anesthesia Plan Comments:         Anesthesia Quick Evaluation

## 2024-01-03 NOTE — TOC Initial Note (Addendum)
 Transition of Care [facility]) - Initial/Assessment Note    Patient Details  Name: Kara West MRN: [id number] Date of Birth: 1975-12-19  [facility] CM/SW Contact:    Delphine KANDICE Bring, RN Phone Number: 01/03/2024, 3:51 PM  Clinical Narrative:                 Therapy working with patient. Family member at bedside. Patient denies DME and services in the home prior to surgery. Patient is aware that [facility] was arranged prior to surgery with [facility] via surgeon office.  Patient had no preference which DME company to use. CM spoke with Mitch at [employer]  Expected Discharge Plan: Home w Home Health Services     Patient Goals and CMS Choice            Expected Discharge Plan and Services         Expected Discharge Date: 01/03/24                   Date DME Agency Contacted: 01/03/24 Time DME Agency Contacted: [phone]     HH Agency: [facility] (was arranged prior to surgery)        Prior Living Arrangements/Services   Lives with:: Parents Patient language and need for interpreter reviewed:: No Do you feel safe going back to the place where you live?: Yes          Current home services:  (none)    Activities of Daily Living      Permission Sought/Granted                  Emotional Assessment Appearance:: Appears stated age   Affect (typically observed): Appropriate, Calm Orientation: : Oriented to Self, Oriented to Place, Oriented to  Time, Oriented to Situation      Admission diagnosis:  Primary osteoarthritis of right knee [M17.11] Patient Active Problem List   Diagnosis Date Noted   Neuropathic pain of left lower extremity 10/23/2019   Chronic pain syndrome 04/16/2019   Chronic radicular lumbar pain 04/16/2019   Bilateral leg pain 11/14/2018   Leg pain 07/11/2018   Lymphedema 07/11/2018   Menorrhagia with regular cycle 03/06/2018   Bilateral lower extremity edema 12/18/2017   Bilateral cellulitis of lower leg 12/18/2017    Sensory polyneuropathy 08/07/2017   Paresthesia of both feet 02/16/2017   Moderate Persistent Asthma 02/18/2015   Allergic rhinitis 02/18/2015   COPD exacerbation (HCC) 12/01/2014   PCP:  [facility]:   [facility] [address] ([address]), [place] - [address] [place]) [place] [address] Phone: [phone] Fax: [phone]  [facility] - [place], [place] - [address] [place] [address] Phone: [phone] Fax: [phone]  [id number] [place], [place] - [address] AT [facility] [address] [place] [address] Phone: [phone] Fax: [phone]     Social Drivers of Health (SDOH) Social History: SDOH Screenings   Food Insecurity: Food Insecurity Present (11/06/2023)   Received from [facility]  Housing: High Risk (11/06/2023)   [facility] from  [facility]  Transportation Needs: No Transportation Needs (11/06/2023)   [facility] from [facility]  Utilities: Not At Risk (11/06/2023)   Received from [facility]  Depression [phone]): Low Risk  (01/22/2020)  Financial Resource Strain: High Risk (11/06/2023)  Received from [facility]  Tobacco Use: Medium Risk (01/03/2024)   SDOH Interventions:     Readmission Risk Interventions     No data to display

## 2024-01-03 NOTE — H&P (Signed)
 History of Present Illness: Kara West is a 48 y.o. female who presents today for history and physical for right total knee arthroplasty with Dr. Edie on 01/01/2024. Patient has had greater than 1 year of increasing right knee pain. She denies any trauma or injury. Her pain is 10 out of 10. MRI shows tricompartmental osteoarthritis with moderate to severe cartilage loss throughout all 3 compartments. Patient has had cortisone injections, Zilretta  injections, gel injections with little relief. Her pain is moderate to severe and can reach 10 out of 10. She has pain mostly along the anterior aspect of the knee but also has some mild pain along the medial and lateral joint line. Her pain is aching and worse with activity and standing. She does have occasional swelling. She will feel occasional grinding throughout the knee. She is tried Tylenol  and other over-the-counter medications with no improvement. Her pain is interfering with her quality of life and activities daily living.  Past Medical History: Anemia  Bronchitis  COPD (chronic obstructive pulmonary disease) (CMS/HHS-HCC)  GERD (gastroesophageal reflux disease)  History of Papanicolaou smear of cervix 2013  Migraine headache  Paresthesia of both feet 02/16/2017   Past Surgical History: LAPAROSCOPIC TUBAL LIGATION 1997  ENDOMETRIAL ABLATION W/ NOVASURE 2013  DILATION AND CURETTAGE, DIAGNOSTIC / THERAPEUTIC 2013  spinal cord stimulator placement 02/17/2020 (Dr Elspeth Ahle at [facility])  CESAREAN SECTION x3   Past Family History: Migraines Mother  Myocardial Infarction (Heart attack) Mother  Stroke Father  Myocardial Infarction (Heart attack) Father   Medications: ADVAIR DISKUS 100-50 mcg/dose diskus inhaler Inhale 1 inhalation into the lungs 2 (two) times daily  inhalational spacer (AEROCHAMBER) spacer Use as directed Use as instructed.   Allergies: No Known Allergies   Review of Systems:  A comprehensive 14 point  ROS was performed, reviewed by me today, and the pertinent orthopaedic findings are documented in the HPI.  Physical Exam: BP 120/64  Ht 175.3 cm (5' 9)  Wt 100.3 kg (221 lb 3.2 oz)  BMI 32.67 kg/m  General:  Well developed, well nourished, no apparent distress, normal affect, normal gait with no antalgic component.   HEENT: Head normocephalic, atraumatic, PERRL.   Abdomen: Soft, non tender, non distended, Bowel sounds present.  Heart: Examination of the heart reveals regular, rate, and rhythm. There is no murmur noted on ascultation. There is a normal apical pulse.  Lungs: Lungs are clear to auscultation. There is no wheeze, rhonchi, or crackles. There is normal expansion of bilateral chest walls.   Right lower Extremity: Examination of the right lower extremity reveals no bony abnormality, no edema, minimal effusion and no ecchymosis. There is no valgus or varus abnormality. Tender along the medial and lateral joint line.. The patient has full knee flexion and extension. 0 to 120 degrees range of motion. Pain with hyperflexion. There is no retropatellar discomfort. The patient has a negative patella stretch test. The patient has a negative varus stress test and a negative valgus stress test, in looking for stability.   Vascular: The patient has a negative Toula' test bilaterally. The patient had a normal dorsalis pedis and posterior tibial pulse. There is normal skin warmth. There is normal capillary refill bilaterally.   Neurologic: The patient has a negative straight leg raise. The patient has normal muscle strength testing for the quadriceps, calves, ankle dorsiflexion, ankle plantarflexion, and extensor hallicus longus. The patient has sensation that is intact to light touch. The deep tendon reflexes are normal at the patella and achilles.  No clonus is noted.   MRI OF THE RIGHT KNEE WITHOUT CONTRAST:  1. Maceration of the anterior horn of the lateral meniscus.  2.  Tricompartmental cartilage abnormalities as described above.  3. Moderate joint effusion.   Impression: Primary osteoarthritis of right knee.  Plan:  The treatment options were discussed with the patient. In addition, patient educational materials were provided regarding the diagnosis and treatment options. The patient is quite frustrated by her symptoms and functional limitations, and is ready to consider more aggressive treatment options. Therefore, I have recommended a surgical procedure, specifically a right total knee arthroplasty. The procedure was discussed with the patient, as were the potential risks (including bleeding, infection, nerve and/or blood vessel injury, persistent or recurrent pain, loosening and/or failure of the components, dislocation, need for further surgery, blood clots, strokes, heart attacks and/or arhythmias, pneumonia, etc.) and benefits. The patient states his/her understanding and wishes to proceed. All of the patient's questions and concerns were answered. She can call any time with further concerns. She will follow up post-surgery, routine.    H&P reviewed and patient re-examined. No changes.

## 2024-01-03 NOTE — Evaluation (Signed)
 Physical Therapy Evaluation Patient Details Name: Sukhman Kocher MRN: [id number] DOB: 05-06-1976 Today's Date: 01/03/2024  History of Present Illness  Pt is a 48 y.o. female with PMH of anemia, bronchitis, COPD, GERD, papanicolaou smear of cervix, migraine headache, and paresthesia of both feet who presents s/p R TKA.  Clinical Impression  Pt was pleasant and motivated to participate during the session and put forth good effort throughout. Pt educated on STS technique with RW, gait sequencing with RW, and car transfer sequencing with good pt carryover. Pt required CGA for STS and ambulation with RW, but remained steady throughout and no LOBs occurred. HEP handout was provided and reviewed with pt. Pt reported no adverse symptoms during the session other than R knee pain with SpO2 and HR WNL throughout on room air. Pt will benefit from continued PT services upon discharge to safely address deficits listed in patient problem list for decreased caregiver assistance and eventual return to PLOF.       If plan is discharge home, recommend the following: A little help with walking and/or transfers;A little help with bathing/dressing/bathroom;Assistance with cooking/housework;Assist for transportation   Can travel by private vehicle        Equipment Recommendations Rolling walker (2 wheels);BSC/3in1  Recommendations for Other Services       Functional Status Assessment Patient has had a recent decline in their functional status and demonstrates the ability to make significant improvements in function in a reasonable and predictable amount of time.     Precautions / Restrictions Precautions Precautions: None Restrictions Weight Bearing Restrictions Per Provider Order: Yes RLE Weight Bearing Per Provider Order: Weight bearing as tolerated      Mobility  Bed Mobility Overal bed mobility: Modified Independent             General bed mobility comments: Pt required extra time and  effort for supine to sit transfer, but no physical assistance or use of bedrails    Transfers Overall transfer level: Needs assistance Equipment used: Rolling walker (2 wheels) Transfers: Sit to/from Stand Sit to Stand: Contact guard assist, From elevated surface           General transfer comment: Pt required CGA for STS from elevated EOB with RW, but remained steady throughout and no LOBs occured    Ambulation/Gait Ambulation/Gait assistance: Contact guard assist Gait Distance (Feet): 90 Feet Assistive device: Rolling walker (2 wheels) Gait Pattern/deviations: Step-through pattern, Decreased step length - right, Decreased step length - left, Decreased stance time - right, Decreased weight shift to right Gait velocity: decreased     General Gait Details: Pt demonstrated decreased cadence and weight shift/stance time on RLE (which was expected), but remained steady throughout and no LOBs occured  Stairs            Wheelchair Mobility     Tilt Bed    Modified Rankin (Stroke Patients Only)       Balance Overall balance assessment: Needs assistance Sitting-balance support: No upper extremity supported, Feet supported Sitting balance-Leahy Scale: Normal     Standing balance support: Bilateral upper extremity supported, During functional activity, Reliant on assistive device for balance Standing balance-Leahy Scale: Fair Standing balance comment: Pt reliant on RW to maintain balance in stance and ambulation, but remained steady throughout and no LOBs occured                             Pertinent Vitals/Pain Pain Assessment Pain Assessment:  0-10 Pain Score: 3  Pain Location: R Knee Pain Descriptors / Indicators: Constant, Operative site guarding, Pressure Pain Intervention(s): Monitored during session    Home Living Family/patient expects to be discharged to:: Private residence Living Arrangements: Parent Available Help at Discharge: Family  (Mother);Available 24 hours/day Type of Home: Apartment Home Access: Level entry       Home Layout: One level Home Equipment: None      Prior Function Prior Level of Function : Independent/Modified Independent             Mobility Comments: Pt reported being independent with community ambulation without AD use. Pt reported 0 falls in the last 6 months. ADLs Comments: Pt reported being independent with ADLs without AD use.     Extremity/Trunk Assessment   Upper Extremity Assessment Upper Extremity Assessment: Overall WFL for tasks assessed    Lower Extremity Assessment Lower Extremity Assessment: RLE deficits/detail;LLE deficits/detail RLE Deficits / Details: RLE ankle DF MMT 1+/5 with MD notified (general anesthesia) RLE: Unable to fully assess due to pain RLE Sensation: WNL RLE Coordination: WNL LLE Deficits / Details: WFL LLE Sensation: WNL LLE Coordination: WNL       Communication   Communication Communication: No apparent difficulties    Cognition Arousal: Alert Behavior During Therapy: WFL for tasks assessed/performed   PT - Cognitive impairments: No apparent impairments                         Following commands: Intact       Cueing Cueing Techniques: Verbal cues, Gestural cues     General Comments      Exercises Total Joint Exercises Quad Sets: AROM, Strengthening, Right, 10 reps Long Arc Quad: AROM, Strengthening, Right, 10 reps Goniometric ROM: R Knee: 9-76 degrees AROM Other Exercises Other Exercises: Education on car transfer sequencing Other Exercises: Education on gait sequencing with RW Other Exercises: Education on STS sequencing with RW Education on HEP with handout provided Pt educated provided on RLE positioning to promote R knee ext PROM and to prevent heel pressure   Assessment/Plan    PT Assessment Patient needs continued PT services  PT Problem List Decreased strength;Decreased mobility;Decreased range of  motion;Decreased activity tolerance;Decreased balance;Pain       PT Treatment Interventions DME instruction;Therapeutic exercise;Gait training;Balance training;Functional mobility training;Therapeutic activities;Patient/family education    PT Goals (Current goals can be found in the Care Plan section)  Acute Rehab PT Goals Patient Stated Goal: to be able to go to the beach PT Goal Formulation: With patient Time For Goal Achievement: 01/16/24 Potential to Achieve Goals: Good    Frequency BID     Co-evaluation               AM-PAC PT 6 Clicks Mobility  Outcome Measure Help needed turning from your back to your side while in a flat bed without using bedrails?: None Help needed moving from lying on your back to sitting on the side of a flat bed without using bedrails?: None Help needed moving to and from a bed to a chair (including a wheelchair)?: A Little Help needed standing up from a chair using your arms (e.g., wheelchair or bedside chair)?: A Little Help needed to walk in hospital room?: A Little Help needed climbing 3-5 steps with a railing? : A Lot 6 Click Score: 19    End of Session Equipment Utilized During Treatment: Gait belt Activity Tolerance: Patient tolerated treatment well Patient left: in bed;with call  bell/phone within reach;with family/visitor present Nurse Communication: Mobility status;Other (comment) (MD notified of lack of R ankle strength/AROM) PT Visit Diagnosis: Other abnormalities of gait and mobility (R26.89);Difficulty in walking, not elsewhere classified (R26.2);Pain;Muscle weakness (generalized) (M62.81) Pain - Right/Left: Right Pain - part of body: Knee    Time: 8469-8391 PT Time Calculation (min) (ACUTE ONLY): 38 min   Charges:   PT Evaluation $PT Eval Moderate Complexity: 1 Mod PT Treatments $Therapeutic Activity: 8-22 mins PT General Charges $$ ACUTE PT VISIT: 1 Visit         Leontine Ingles, SPT 01/03/24, 5:28 PM

## 2024-01-04 ENCOUNTER — Encounter: Payer: Self-pay | Admitting: Surgery

## 2024-01-11 ENCOUNTER — Other Ambulatory Visit: Payer: Self-pay | Admitting: Student

## 2024-01-11 ENCOUNTER — Ambulatory Visit
Admission: RE | Admit: 2024-01-11 | Discharge: 2024-01-11 | Disposition: A | Discharge status: Home / Self Care | Source: Ambulatory Visit | Attending: Student | Admitting: Student

## 2024-01-11 DIAGNOSIS — Z96651 Presence of right artificial knee joint: Secondary | ICD-10-CM

## 2024-01-11 DIAGNOSIS — M7989 Other specified soft tissue disorders: Secondary | ICD-10-CM | POA: Insufficient documentation

## 2024-01-24 NOTE — Telephone Encounter (Signed)
 Kara West

## 2024-03-07 ENCOUNTER — Other Ambulatory Visit: Payer: Self-pay | Admitting: Family Medicine

## 2024-03-07 DIAGNOSIS — Z1231 Encounter for screening mammogram for malignant neoplasm of breast: Secondary | ICD-10-CM
